# Patient Record
Sex: Male | Born: 1935 | Race: Black or African American | Hispanic: No | Marital: Married | Smoking: Never smoker
Health system: Southern US, Community
[De-identification: ages and names within clinical notes are randomized; demographics above are authoritative.]

## PROBLEM LIST (undated history)

## (undated) DIAGNOSIS — F32A Depression, unspecified: Secondary | ICD-10-CM

## (undated) DIAGNOSIS — I10 Essential (primary) hypertension: Secondary | ICD-10-CM

## (undated) DIAGNOSIS — F419 Anxiety disorder, unspecified: Secondary | ICD-10-CM

## (undated) DIAGNOSIS — K5790 Diverticulosis of intestine, part unspecified, without perforation or abscess without bleeding: Secondary | ICD-10-CM

## (undated) DIAGNOSIS — I878 Other specified disorders of veins: Secondary | ICD-10-CM

## (undated) DIAGNOSIS — F329 Major depressive disorder, single episode, unspecified: Secondary | ICD-10-CM

## (undated) DIAGNOSIS — K222 Esophageal obstruction: Secondary | ICD-10-CM

## (undated) DIAGNOSIS — C73 Malignant neoplasm of thyroid gland: Secondary | ICD-10-CM

## (undated) DIAGNOSIS — E739 Lactose intolerance, unspecified: Secondary | ICD-10-CM

## (undated) DIAGNOSIS — E559 Vitamin D deficiency, unspecified: Secondary | ICD-10-CM

## (undated) DIAGNOSIS — K219 Gastro-esophageal reflux disease without esophagitis: Secondary | ICD-10-CM

## (undated) DIAGNOSIS — E039 Hypothyroidism, unspecified: Secondary | ICD-10-CM

## (undated) DIAGNOSIS — Z87438 Personal history of other diseases of male genital organs: Secondary | ICD-10-CM

## (undated) DIAGNOSIS — K5730 Diverticulosis of large intestine without perforation or abscess without bleeding: Secondary | ICD-10-CM

## (undated) DIAGNOSIS — K449 Diaphragmatic hernia without obstruction or gangrene: Secondary | ICD-10-CM

## (undated) DIAGNOSIS — K589 Irritable bowel syndrome without diarrhea: Secondary | ICD-10-CM

## (undated) HISTORY — PX: CATARACT EXTRACTION: SUR2

## (undated) HISTORY — DX: Gastro-esophageal reflux disease without esophagitis: K21.9

## (undated) HISTORY — DX: Malignant neoplasm of thyroid gland: C73

## (undated) HISTORY — DX: Vitamin D deficiency, unspecified: E55.9

## (undated) HISTORY — PX: COLONOSCOPY: SHX174

## (undated) HISTORY — DX: Major depressive disorder, single episode, unspecified: F32.9

## (undated) HISTORY — DX: Depression, unspecified: F32.A

## (undated) HISTORY — DX: Esophageal obstruction: K22.2

## (undated) HISTORY — DX: Hypothyroidism, unspecified: E03.9

## (undated) HISTORY — DX: Other specified disorders of veins: I87.8

## (undated) HISTORY — DX: Personal history of other diseases of male genital organs: Z87.438

## (undated) HISTORY — DX: Anxiety disorder, unspecified: F41.9

## (undated) HISTORY — DX: Diaphragmatic hernia without obstruction or gangrene: K44.9

## (undated) HISTORY — DX: Lactose intolerance, unspecified: E73.9

## (undated) HISTORY — DX: Irritable bowel syndrome, unspecified: K58.9

## (undated) HISTORY — DX: Diverticulosis of intestine, part unspecified, without perforation or abscess without bleeding: K57.90

## (undated) HISTORY — DX: Essential (primary) hypertension: I10

## (undated) HISTORY — DX: Diverticulosis of large intestine without perforation or abscess without bleeding: K57.30

---

## 1988-10-09 HISTORY — PX: THYROIDECTOMY: SHX17

## 1990-10-09 HISTORY — PX: HERNIA REPAIR: SHX51

## 2000-04-18 ENCOUNTER — Ambulatory Visit (HOSPITAL_COMMUNITY): Admission: RE | Admit: 2000-04-18 | Discharge: 2000-04-18 | Payer: Self-pay | Admitting: Gastroenterology

## 2001-11-13 ENCOUNTER — Ambulatory Visit (HOSPITAL_COMMUNITY): Admission: RE | Admit: 2001-11-13 | Discharge: 2001-11-13 | Payer: Self-pay | Admitting: Gastroenterology

## 2003-06-05 ENCOUNTER — Ambulatory Visit (HOSPITAL_COMMUNITY): Admission: RE | Admit: 2003-06-05 | Discharge: 2003-06-05 | Payer: Self-pay | Admitting: Internal Medicine

## 2003-07-27 ENCOUNTER — Encounter: Admission: RE | Admit: 2003-07-27 | Discharge: 2003-07-27 | Payer: Self-pay | Admitting: Internal Medicine

## 2003-07-27 ENCOUNTER — Encounter: Payer: Self-pay | Admitting: Internal Medicine

## 2005-07-28 ENCOUNTER — Ambulatory Visit: Payer: Self-pay | Admitting: Gastroenterology

## 2006-11-01 ENCOUNTER — Ambulatory Visit: Payer: Self-pay | Admitting: Gastroenterology

## 2006-11-07 ENCOUNTER — Ambulatory Visit: Payer: Self-pay | Admitting: Gastroenterology

## 2006-11-27 ENCOUNTER — Ambulatory Visit: Payer: Self-pay | Admitting: Gastroenterology

## 2009-10-20 DIAGNOSIS — K449 Diaphragmatic hernia without obstruction or gangrene: Secondary | ICD-10-CM | POA: Insufficient documentation

## 2009-10-20 DIAGNOSIS — I10 Essential (primary) hypertension: Secondary | ICD-10-CM | POA: Insufficient documentation

## 2009-10-20 DIAGNOSIS — E890 Postprocedural hypothyroidism: Secondary | ICD-10-CM

## 2009-10-20 DIAGNOSIS — K219 Gastro-esophageal reflux disease without esophagitis: Secondary | ICD-10-CM

## 2009-10-20 DIAGNOSIS — K222 Esophageal obstruction: Secondary | ICD-10-CM | POA: Insufficient documentation

## 2009-10-22 ENCOUNTER — Ambulatory Visit: Payer: Self-pay | Admitting: Gastroenterology

## 2009-10-22 DIAGNOSIS — E559 Vitamin D deficiency, unspecified: Secondary | ICD-10-CM | POA: Insufficient documentation

## 2009-10-22 DIAGNOSIS — K5731 Diverticulosis of large intestine without perforation or abscess with bleeding: Secondary | ICD-10-CM | POA: Insufficient documentation

## 2009-10-22 DIAGNOSIS — R1013 Epigastric pain: Secondary | ICD-10-CM | POA: Insufficient documentation

## 2009-10-22 DIAGNOSIS — Z85850 Personal history of malignant neoplasm of thyroid: Secondary | ICD-10-CM | POA: Insufficient documentation

## 2009-10-22 LAB — CONVERTED CEMR LAB
Basophils Absolute: 0 10*3/uL (ref 0.0–0.1)
Basophils Relative: 0.5 % (ref 0.0–3.0)
Eosinophils Absolute: 0.1 10*3/uL (ref 0.0–0.7)
Eosinophils Relative: 2.2 % (ref 0.0–5.0)
Ferritin: 76 ng/mL (ref 22.0–322.0)
Folate: 6.8 ng/mL
HCT: 41.1 % (ref 39.0–52.0)
Hemoglobin: 13.5 g/dL (ref 13.0–17.0)
Iron: 63 ug/dL (ref 42–165)
Lymphocytes Relative: 38.7 % (ref 12.0–46.0)
Lymphs Abs: 1.4 10*3/uL (ref 0.7–4.0)
MCHC: 32.9 g/dL (ref 30.0–36.0)
MCV: 95.6 fL (ref 78.0–100.0)
Monocytes Absolute: 0.2 10*3/uL (ref 0.1–1.0)
Monocytes Relative: 6.2 % (ref 3.0–12.0)
Neutro Abs: 1.9 10*3/uL (ref 1.4–7.7)
Neutrophils Relative %: 52.4 % (ref 43.0–77.0)
Platelets: 185 10*3/uL (ref 150.0–400.0)
RBC: 4.3 M/uL (ref 4.22–5.81)
RDW: 12.3 % (ref 11.5–14.6)
Saturation Ratios: 21.5 % (ref 20.0–50.0)
Transferrin: 209.3 mg/dL — ABNORMAL LOW (ref 212.0–360.0)
Vitamin B-12: 420 pg/mL (ref 211–911)
WBC: 3.6 10*3/uL — ABNORMAL LOW (ref 4.5–10.5)

## 2009-10-27 ENCOUNTER — Ambulatory Visit (HOSPITAL_COMMUNITY)
Admission: RE | Admit: 2009-10-27 | Discharge: 2009-10-27 | Payer: Self-pay | Source: Home / Self Care | Admitting: Gastroenterology

## 2010-05-13 ENCOUNTER — Encounter: Payer: Self-pay | Admitting: Gastroenterology

## 2010-05-18 ENCOUNTER — Ambulatory Visit: Payer: Self-pay | Admitting: Gastroenterology

## 2010-06-15 ENCOUNTER — Encounter (INDEPENDENT_AMBULATORY_CARE_PROVIDER_SITE_OTHER): Payer: Self-pay | Admitting: *Deleted

## 2010-06-15 ENCOUNTER — Telehealth: Payer: Self-pay | Admitting: Gastroenterology

## 2010-06-16 ENCOUNTER — Ambulatory Visit: Payer: Self-pay | Admitting: Gastroenterology

## 2010-06-20 ENCOUNTER — Telehealth: Payer: Self-pay | Admitting: Gastroenterology

## 2010-06-29 ENCOUNTER — Ambulatory Visit: Payer: Self-pay | Admitting: Gastroenterology

## 2010-07-04 ENCOUNTER — Ambulatory Visit (HOSPITAL_COMMUNITY): Admission: RE | Admit: 2010-07-04 | Discharge: 2010-07-04 | Payer: Self-pay | Admitting: Gastroenterology

## 2010-07-18 ENCOUNTER — Telehealth: Payer: Self-pay | Admitting: Gastroenterology

## 2010-11-08 NOTE — Procedures (Signed)
Summary: EGD   EGD  Procedure date:  11/27/2006  Findings:      Location: [facility]    EGD  Procedure date:  11/27/2006  Findings:      Location: [facility]   Patient Name: Ian Weber, Ian Weber MRN: [id number] Procedure Procedures: Panendoscopy (EGD) CPT: 43235.    with esophageal dilation. CPT: G9296129.  Personnel: Endoscopist: Vania Rea. Jarold Motto, MD.  Exam Location: Exam performed in Outpatient Clinic. Outpatient  Patient Consent: Procedure, Alternatives, Risks and Benefits discussed, consent obtained, from patient. Consent was obtained by the RN.  Indications Symptoms: Dysphagia. Reflux symptoms for 6-10 yrs, occurring 3-6 times/wk.  History  Current Medications: Patient is not currently taking Coumadin.  Medical/Surgical History: Hypertension, Hypothyroidism, Reflux Disease,  Pre-Exam Physical: Performed Nov 27, 2006  Cardio-pulmonary exam, Abdominal exam, Extremity exam, Mental status exam WNL.  Comments: Pt. history reviewed/updated, physical exam performed prior to initiation of sedation?yes Exam Exam Info: Maximum depth of insertion Duodenum, intended Duodenum. Patient position: on left side. Duration of exam: 10 minutes. Vocal cords visualized. Gastric retroflexion performed. Images taken. ASA Classification: II. Tolerance: excellent.  Sedation Meds: Patient assessed and found to be appropriate for moderate (conscious) sedation. Cetacaine Spray 2 sprays given aerosolized. Versed 1 mg. given IV.  Monitoring: BP and pulse monitoring done. Oximetry used. Supplemental O2 given at 2 Liters.  Instrument(s): GIF 160. Serial [id number].   Findings - HIATAL HERNIA: Prolapsing, 4 cms. in length. ICD9: Hernia, Hiatal: 553.3. - Normal: Proximal Esophagus to Distal Esophagus. Not Seen: Tumor. Barrett's esophagus. Esophageal inflammation. Mucosal abnormality. Foreign body. Stricture. Varices.  - Dilation: Proximal Esophagus. for  dysphagia without stricture. Maloney dilator used, Diameter: 58 F, No Resistance, Minimal Heme present on extraction. 1  total dilators used. Patient tolerance excellent. Outcome: successful.  - Normal: Fundus to Duodenal 2nd Portion. Tumor. Ulcer. Mucosal abnormality. AVM's. Foreign body. Polyp. Varices.   Assessment  Diagnoses: 553.3: Hernia, Hiatal. Chronic GERD.   Comments: Occult peptic stricture dilated.... Events  Unplanned Intervention: No unplanned interventions were required.  Plans Medication(s): PPI: Esomeprazole/Nexium 40 mg QAM, starting Nov 27, 2006 for indefinitely.   Patient Education: Patient given standard instructions for: Reflux. Stenosis / Stricture.  Disposition: After procedure patient sent to recovery. After recovery patient sent home.  Scheduling: Follow-up prn.    This report was created from the original endoscopy report, which was reviewed and signed by the above listed endoscopist.

## 2010-11-08 NOTE — Letter (Signed)
Summary: EGD Instructions  [facility]  [address] [place], [place] [address]   Phone: [phone]  Fax: [phone]       [facility]    [birth year]    MRN: [id number]       Procedure Day Dorna Bloom:  Wednesday 06/29/2010     Arrival Time:  2:30 pm     Procedure Time: 3:30 pm     Location of Procedure:                    _ x _  Endoscopy Center (4th Floor)    PREPARATION FOR ENDOSCOPY   On Wednesday 9/21, THE DAY OF THE PROCEDURE:  1.   No solid foods, milk or milk products are allowed after midnight the night before your procedure.  2.   Do not drink anything colored red or purple.  Avoid juices with pulp.  No orange juice.  3.  You may drink clear liquids until 1:30 pm, which is 2 hours before your procedure.                                                                                                CLEAR LIQUIDS INCLUDE: Water Jello Ice Popsicles Tea (sugar ok, no milk/cream) Powdered fruit flavored drinks Coffee (sugar ok, no milk/cream) Gatorade Juice: apple, white grape, white cranberry  Lemonade Clear bullion, consomm, broth Carbonated beverages (any kind) Strained chicken noodle soup Hard Candy   MEDICATION INSTRUCTIONS  Unless otherwise instructed, you should take regular prescription medications with a small sip of water as early as possible the morning of your procedure.               OTHER INSTRUCTIONS  You will need a responsible adult at least 75 years of age to accompany you and drive you home.   This person must remain in the waiting room during your procedure.  Wear loose fitting clothing that is easily removed.  Leave jewelry and other valuables at home.  However, you may wish to bring a book to read or an iPod/MP3 player to listen to music as you wait for your procedure to start.  Remove all body piercing jewelry and leave at home.  Total time from sign-in until discharge is approximately 2-3 hours.  You  should go home directly after your procedure and rest.  You can resume normal activities the day after your procedure.  The day of your procedure you should not:   Drive   Make legal decisions   Operate machinery   Drink alcohol   Return to work  You will receive specific instructions about eating, activities and medications before you leave.    The above instructions have been reviewed and explained to me by   Ezra Sites RN  June 16, 2010 10:33 AM     I fully understand and can verbalize these instructions _____________________________ Date _________

## 2010-11-08 NOTE — Miscellaneous (Signed)
Summary: rx for diflucane  Clinical Lists Changes  Medications: Added new medication of FLUCONAZOLE 100 MG TABS (FLUCONAZOLE) take twice a day on day 1, then daily for 10 days - Signed Rx of FLUCONAZOLE 100 MG TABS (FLUCONAZOLE) take twice a day on day 1, then daily for 10 days;  #12 x 0;  Signed;  Entered by: Oda Cogan RN;  Authorized by: Mardella Layman MD [facility];  Method used: [facility]  [phone]*, [address], [place], [place], [place]  [id number], Ph: [phone] or [phone], Fax: [phone]    Prescriptions: FLUCONAZOLE 100 MG TABS (FLUCONAZOLE) take twice a day on day 1, then daily for 10 days  #12 x 0   Entered by:   Oda Cogan RN   Authorized by:   Mardella Layman MD [facility]   Signed by:   Oda Cogan RN on 06/29/2010   Method used:   [facility]  [address] [phone]* (retail)       [address]       [place], [place]  [id number]       Ph: [phone] or [phone]       Fax: [phone]   RxID:   [id number]

## 2010-11-08 NOTE — Miscellaneous (Signed)
Summary: [facility]  Clinical Lists Changes  Allergies: Changed allergy or adverse reaction from CODEINE PHOSPHATE (CODEINE PHOSPHATE) to CODEINE PHOSPHATE (CODEINE PHOSPHATE) Changed allergy or adverse reaction from SULFAMETHOXAZOLE (SULFAMETHOXAZOLE) to SULFAMETHOXAZOLE (SULFAMETHOXAZOLE) Changed allergy or adverse reaction from PREDNISONE to PREDNISONE

## 2010-11-08 NOTE — Procedures (Signed)
Summary: Upper Endoscopy  Patient: Ian Weber Note: All result statuses are Final unless otherwise noted.  Tests: (1) Upper Endoscopy (EGD)   EGD Upper Endoscopy       DONE     [facility]     [address]     [place], [place]  [address]           ENDOSCOPY PROCEDURE REPORT           PATIENT:  Ian Weber, Ian Weber  MR#:  [id number]     BIRTHDATE:  October 14, 1933, 75 yrs. old  GENDER:  male           ENDOSCOPIST:  Vania Rea. Jarold Motto, MD, [facility]     Referred by:           PROCEDURE DATE:  06/29/2010     PROCEDURE:  EGD, diagnostic     ASA CLASS:  Class II     INDICATIONS:  abdominal pain despite treatment           MEDICATIONS:   Fentanyl 50 mcg IV, Versed 5 mg IV     TOPICAL ANESTHETIC:           DESCRIPTION OF PROCEDURE:   After the risks benefits and     alternatives of the procedure were thoroughly explained, informed     consent was obtained.  The LB GIF-H180 [id number] endoscope was     introduced through the mouth and advanced to the second portion of     the duodenum, without limitations.  The instrument was slowly     withdrawn as the mucosa was fully examined.     <<PROCEDUREIMAGES>>           Candida esophagitis in the mid esophagus. SEE PICTURES.  Otherwise     the examination was normal.    Retroflexed views revealed no     abnormalities.    The scope was then withdrawn from the patient     and the procedure completed.           COMPLICATIONS:  None           ENDOSCOPIC IMPRESSION:     1) Candida esophagitis in the mid esophagus     2) Otherwise normal examination     R/O GALLSTONES,,,,     RECOMMENDATIONS:     1.DIFLUCAN 100 MG BID FOR 1 DAY,THEN DAILY FOR 10 DAYS.#15.NO     REFILL.     2. WE WILL CALL TO SCHEDULE ULTRASOUND EXAM.     3.STOP NEXIUM.           REPEAT EXAM:  No           ______________________________     Vania Rea. Jarold Motto, MD, Clementeen Graham           CC:  Ralene Ok MD           n.     Rosalie DoctorVania Rea. Farida Mcreynolds at 06/29/2010 04:05 PM        Kita, Jesusita Oka, [phone]  Note: An exclamation mark (!) indicates a result that was not dispersed into the flowsheet. Document Creation Date: 06/29/2010 4:06 PM _______________________________________________________________________  (1) Order result status: Final Collection or observation date-time: 06/29/2010 15:57 Requested date-time:  Receipt date-time:  Reported date-time:  Referring Physician:   Ordering Physician: Sheryn Bison [phone]) Specimen Source:  Source: Launa Grill Order Number: [id number] Lab site:   Appended Document: Orders Update information sent to lec and gave to Peggy she will give  ot the patient.   Clinical Lists Changes  Orders: Added new Test order of Ultrasound Abdomen (UAS) - Signed

## 2010-11-08 NOTE — Assessment & Plan Note (Signed)
Summary: stomach pain after eating...as.   History of Present Illness Visit Type: Follow-up Visit Primary GI MD: Sheryn Bison MD FACP FAGA Primary Provider: Felisa Bonier, MD Chief Complaint: Adominal pain after eating in epigastric region  is better now History of Present Illness:   This patient is a 77 year old African American male with chronic GERD he's had intermittent epigastric abdominal pain described as a dull aching pain occurring every several months and lasting several weeks in duration made worse by eating. He denies alcohol, cigarette, or NSAID abuse. He's had no dysphagia, anorexia, weight loss, or nausea and vomiting or change in bowel habits, melena or hematochezia. Recent liver profile per his primary care doctor was normal. Last ultrasound exam was done in January 2008. He has known diverticulosis on recent colonoscopy. Previous evaluation for Helicobacter pylori gastritis was negative in 2003 .He has chronic low grade leukopenia and borderline B12 levels.   GI Review of Systems    Reports abdominal pain.     Location of  Abdominal pain: upper abdomen.    Denies acid reflux, belching, bloating, chest pain, dysphagia with liquids, dysphagia with solids, heartburn, loss of appetite, nausea, vomiting, vomiting blood, weight loss, and  weight gain.        Denies anal fissure, black tarry stools, change in bowel habit, constipation, diarrhea, diverticulosis, fecal incontinence, heme positive stool, hemorrhoids, irritable bowel syndrome, jaundice, light color stool, liver problems, rectal bleeding, and  rectal pain. Preventive Screening-Counseling & Management  Alcohol-Tobacco     Smoking Status: never      Drug Use:  no.      Current Medications (verified): 1)  Omeprazole 20 Mg Cpdr (Omeprazole) .... Take 1 Capsule By Mouth Once A Day 2)  Synthroid 112 Mcg Tabs (Levothyroxine Sodium) .Marland Kitchen.. 1 Tablet By Mouth Once Daily 3)  Bystolic 2.5 Mg Tabs (Nebivolol Hcl) .Marland Kitchen.. 1 Tablet  By Mouth Once Daily 4)  Avapro 300 Mg Tabs (Irbesartan) .Marland Kitchen.. 1 Tablet By Mouth Once Daily 5)  Vitamin D (Ergocalciferol) 50000 Unit Caps (Ergocalciferol) .Marland Kitchen.. 1 Tablet By Mouth Once Weekly 6)  Metamucil 30.9 % Powd (Psyllium) .Marland Kitchen.. 1 Teaspoon in 8 Oz Glass of Water Once Daily  Allergies: 1)  ! Prednisone 2)  Codeine Phosphate (Codeine Phosphate) 3)  Sulfamethoxazole (Sulfamethoxazole)  Past History:  Past medical, surgical, family and social histories (including risk factors) reviewed for relevance to current acute and chronic problems.  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 Thyroidectomy (cancer) Hernia Surgery  Family History: Reviewed history and no changes required. No FH of Colon Cancer: Unknown Family History  Social History: Reviewed history and no changes required. Occupation: Retired Patient has never smoked.  Alcohol Use - no Illicit Drug Use - no Married 10 children Smoking Status:  never Drug Use:  no  Review of Systems       The patient complains of urination - excessive.  The patient denies allergy/sinus, anemia, anxiety-new, arthritis/joint pain, back pain, blood in urine, breast changes/lumps, change in vision, confusion, cough, coughing up blood, depression-new, fainting, fatigue, fever, headaches-new, hearing problems, heart murmur, heart rhythm changes, itching, muscle pains/cramps, night sweats, nosebleeds, shortness of breath, skin rash, sleeping problems, sore throat, swelling of feet/legs, swollen lymph glands, thirst - excessive, urination changes/pain, urine leakage, vision changes, and voice change.    Vital Signs:  Patient profile:   77 year  old male Height:      72 inches Weight:      222.50 pounds BMI:      30.29 Pulse rate:   60 / minute Pulse rhythm:   regular BP sitting:   132 / 80  (left arm)  Vitals Entered By: Milford Cage NCMA (October 22, 2009 8:34 AM)  Physical Exam  General:  Well developed, well nourished, no acute distress.healthy appearing.  healthy appearing.   Head:  Normocephalic and atraumatic. Eyes:  PERRLA, no icterus.exam deferred to patient's ophthalmologist.  exam deferred to patient's ophthalmologist.   Neck:  Supple; no masses or thyromegaly. Lungs:  Clear throughout to auscultation. Heart:  Regular rate and rhythm; no murmurs, rubs,  or bruits. Abdomen:  Soft, nontender and nondistended. No masses, hepatosplenomegaly or hernias noted. Normal bowel sounds. Extremities:  No clubbing, cyanosis, edema or deformities noted.trace pedal edema.  trace pedal edema.   Neurologic:  Alert and  oriented x4;  grossly normal neurologically. Cervical Nodes:  No significant cervical adenopathy. Inguinal Nodes:  No significant inguinal adenopathy. Psych:  Alert and cooperative. Normal mood and affect.   Impression & Recommendations:  Problem # 1:  ABDOMINAL PAIN, EPIGASTRIC (ICD-789.06) Assessment Unchanged Rule Out cholelithiasis versus functional dyspepsia. I ordered upper normal ultrasound exam and repeat labs. Continue daily omeprazole and standard antireflux maneuvers. Orders: TLB-CBC Platelet - w/Differential (85025-CBCD) TLB-B12, Serum-Total ONLY (09811-B14) TLB-Ferritin (82728-FER) TLB-Folic Acid (Folate) (82746-FOL) TLB-IBC Pnl (Iron/FE;Transferrin) (83550-IBC) Ultrasound Abdomen (UAS)  Problem # 2:  ESOPHAGEAL STRICTURE (ICD-530.3) Assessment: Improved  Problem # 3:  DIVERTICULOSIS-COLON (ICD-562.10) Assessment: Improved high-fiber dietas tolerated and daily metamucil.  Problem # 4:  VITAMIN D DEFICIENCY (ICD-268.9) Assessment: Unchanged continue other meds per primary care  Problem # 5:  HYPERTENSION (ICD-401.9) Assessment: Improved Blood pressure  today is100/30, and he is to continue his Bystolic 2.5 mg a day  Patient Instructions: 1)  Copy sent to : Dr. Felisa Bonier 2)  Please continue current medications.  3)  Labs Pending 4)  Upper abdominal ultrasound exam 5)  Repeat B12 level. 6)  Diet should be high in fiber ( fruits, vegetables, whole grains) but low in residue. Drink at least eight (8) glasses of water a day.  7)  The medication list was reviewed and reconciled.  All changed / newly prescribed medications were explained.  A complete medication list was provided to the patient / caregiver.

## 2010-11-08 NOTE — Progress Notes (Signed)
Summary: Ultrasound results  Phone Note Call from Patient Call back at Home Phone [phone]   Caller: Patient Call For: Dr. Jarold Motto Reason for Call: Lab or Test Results Summary of Call: Calling about Ultrasound results Initial call taken by: Karna Christmas,  July 18, 2010 12:29 PM  Follow-up for Phone Call        patient advised the [place] was normal Follow-up by: Darcey Nora RN, CGRN,  July 18, 2010 2:47 PM

## 2010-11-08 NOTE — Procedures (Signed)
Summary: Colon   Colonoscopy  Procedure date:  11/27/2006  Findings:      Location:  [facility].    Colonoscopy  Procedure date:  11/27/2006  Findings:      Location:  [facility].   Patient Name: Ian Weber, Ian Weber MRN: [id number] Procedure Procedures: Colonoscopy CPT: 8312614060.  Personnel: Endoscopist: Vania Rea. Jarold Motto, MD.  Exam Location: Exam performed in Outpatient Clinic. Outpatient  Patient Consent: Procedure, Alternatives, Risks and Benefits discussed, consent obtained, from patient. Consent was obtained by the RN.  Indications Symptoms: Abdominal pain / bloating. Change in bowel habits.  History  Current Medications: Patient is not currently taking Coumadin.  Medical/ Surgical History: Hypertension, Hypothyroidism, Reflux Disease,  Pre-Exam Physical: Performed Nov 27, 2006. Cardio-pulmonary exam, Rectal exam, Abdominal exam, Extremity exam, Mental status exam WNL.  Comments: Pt. history reviewed/updated, physical exam performed prior to initiation of sedation?yes Exam Exam: Extent of exam reached: Cecum, extent intended: Cecum.  The cecum was identified by appendiceal orifice and IC valve. Patient position: on left side. Time to Cecum: 00:02:31. Time for Withdrawl: 00:05. Colon retroflexion performed. Images taken. ASA Classification: II. Tolerance: excellent.  Monitoring: Pulse and BP monitoring, Oximetry used. Supplemental O2 given. at 2 Liters.  Colon Prep Used Golytely for colon prep. Prep results: good.  Sedation Meds: Patient assessed and found to be appropriate for moderate (conscious) sedation. Fentanyl 75 mcg. given IV. Versed 6 mg. given IV.  Instrument(s): CF 140L. Serial [id number].  Findings - DIVERTICULOSIS: Descending Colon to Sigmoid Colon. Not bleeding. ICD9: Diverticulosis, Colon: 562.10.  - NORMAL EXAM: Cecum to Rectum. Not Seen: Polyps. AVM's. Colitis. Tumors. Melanosis. Crohn's. Hemorrhoids.    Assessment  Diagnoses: 562.10: Diverticulosis, Colon.   Events  Unplanned Interventions: No intervention was required.  Plans Medication Plan: Referring provider to order medications. Fiber supplements: Methylcellulose 1 tsp QAM, starting Nov 27, 2006 for indefinitely.   Patient Education: Patient given standard instructions for: Diverticulosis. high fiber diet.  Disposition: After procedure patient sent to recovery. After recovery patient sent home.  Scheduling/Referral: Follow-Up prn.    This report was created from the original endoscopy report, which was reviewed and signed by the above listed endoscopist.

## 2010-11-08 NOTE — Progress Notes (Signed)
Summary: med reaction  Phone Note Call from Patient Call back at Home Phone [phone]   Caller: Patient Call For: Dr. Jarold Motto Reason for Call: Talk to Nurse Summary of Call: thinks Nexium is causing severe dizziness Initial call taken by: Vallarie Mare,  June 20, 2010 10:23 AM  Follow-up for Phone Call        Pt. started Nexium on 06/16/10 and the next day he started having dizziness.He took med. thru 06/18/10 and did not take it yesterday says he plans to  to re-start prilosec OTC which he has on handand will keep EGD. appt. Follow-up by: Teryl Lucy RN,  June 20, 2010 11:08 AM

## 2010-11-08 NOTE — Assessment & Plan Note (Signed)
Summary: ABD PAIN...AS.   History of Present Illness Visit Type: Follow-up Visit Primary GI MD: Sheryn Bison MD FACP FAGA Primary Provider: Felisa Bonier, MD Chief Complaint: epigastric pain, burning History of Present Illness:   continued reflux symptoms with varus external and subxiphoid pain despite Prilosec 20 mg a day. He denies dysphasia, anorexia, weight loss, or hepatobiliary complaints. Ultrasound exam and labs have all been unremarkable.   GI Review of Systems    Reports abdominal pain, acid reflux, and  bloating.     Location of  Abdominal pain: epigastric area.    Denies belching, chest pain, dysphagia with liquids, dysphagia with solids, heartburn, loss of appetite, nausea, vomiting, vomiting blood, weight loss, and  weight gain.        Denies anal fissure, black tarry stools, change in bowel habit, constipation, diarrhea, diverticulosis, fecal incontinence, heme positive stool, hemorrhoids, irritable bowel syndrome, jaundice, light color stool, liver problems, rectal bleeding, and  rectal pain.    Current Medications (verified): 1)  Omeprazole 20 Mg Cpdr (Omeprazole) .... Take 1 Capsule By Mouth Once A Day 2)  Synthroid 112 Mcg Tabs (Levothyroxine Sodium) .Marland Kitchen.. 1 Tablet By Mouth Once Daily 3)  Bystolic 2.5 Mg Tabs (Nebivolol Hcl) .Marland Kitchen.. 1 Tablet By Mouth Once Daily 4)  Avapro 300 Mg Tabs (Irbesartan) .Marland Kitchen.. 1 Tablet By Mouth Once Daily 5)  Vitamin D (Ergocalciferol) 50000 Unit Caps (Ergocalciferol) .Marland Kitchen.. 1 Tablet By Mouth Once Weekly 6)  Metamucil 30.9 % Powd (Psyllium) .Marland Kitchen.. 1 Teaspoon in 8 Oz Glass of Water Once Daily  Allergies (verified): 1)  ! Prednisone 2)  Codeine Phosphate (Codeine Phosphate) 3)  Sulfamethoxazole (Sulfamethoxazole)  Past History:  Past medical, surgical, family and social histories (including risk factors) reviewed for relevance to current acute and chronic problems.  Past Medical History: Reviewed history from 10/22/2009 and no changes  required. Current Problems:  HYPOTHYROIDISM, POSTSURGICAL (ICD-244.0) HYPERTENSION (ICD-401.9) ESOPHAGEAL STRICTURE (ICD-530.3) GERD (ICD-530.81) HIATAL HERNIA (ICD-553.3)  Past Surgical History: Reviewed history from 10/20/2009 and no changes required. Thyroidectomy (cancer) Hernia Surgery  Family History: Reviewed history from 10/22/2009 and no changes required. No FH of Colon Cancer: Unknown Family History  Social History: Reviewed history from 10/22/2009 and no changes required. Occupation: Retired Patient has never smoked.  Alcohol Use - no Illicit Drug Use - no Married 10 children   Review of Systems  The patient denies allergy/sinus, anemia, anxiety-new, arthritis/joint pain, back pain, blood in urine, breast changes/lumps, change in vision, confusion, cough, coughing up blood, depression-new, fainting, fatigue, fever, headaches-new, hearing problems, heart murmur, heart rhythm changes, itching, menstrual pain, muscle pains/cramps, night sweats, nosebleeds, pregnancy symptoms, shortness of breath, skin rash, sleeping problems, sore throat, swelling of feet/legs, swollen lymph glands, thirst - excessive, urination - excessive, urination changes/pain, urine leakage, vision changes, and voice change.    Vital Signs:  Patient profile:   77 year old male Height:      72 inches Weight:      203 pounds BMI:     27.63 Pulse rate:   60 / minute Pulse rhythm:   regular BP sitting:   126 / 80  (left arm) Cuff size:   regular  Vitals Entered By: June McMurray CMA Duncan Dull) (May 18, 2010 9:47 AM)  Physical Exam  General:  Well developed, well nourished, no acute distress.healthy appearing.   Head:  Normocephalic and atraumatic. Eyes:  PERRLA, no icterus.exam deferred to patient's ophthalmologist.   Lungs:  Clear throughout to auscultation. Heart:  Regular rate and rhythm;  no murmurs, rubs,  or bruits. Abdomen:  Soft, nontender and nondistended. No masses,  hepatosplenomegaly or hernias noted. Normal bowel sounds. Cervical Nodes:  No significant cervical adenopathy. Psych:  Alert and cooperative. Normal mood and affect.   Impression & Recommendations:  Problem # 1:  ABDOMINAL PAIN, EPIGASTRIC (ICD-789.06) Assessment Unchanged Symptoms and chronic workup consistent with acid reflux disease. I will change him to Dexilant 60 mg a day with strict anti-reflex maneuvers. He is to call in 2 weeks time for progress report. If not better, we will proceed with repeat endoscopy was last done in 2008.  Problem # 2:  DIVERTICULOSIS-COLON (ICD-562.10) Assessment: Unchanged Continue high-fiber diet as tolerated.  Patient Instructions: 1)  Dexilant samples given.  Try for 2 weeks and call our office with an update.  If the Dexilant works better we will call in a prescription for you. 2)  The medication list was reviewed and reconciled.  All changed / newly prescribed medications were explained.  A complete medication list was provided to the patient / caregiver. 3)  Copy sent to : Dr. Felisa Bonier 4)  Avoid foods high in acid content ( tomatoes, citrus juices, spicy foods) . Avoid eating within 3 to 4 hours of lying down or before exercising. Do not over eat; try smaller more frequent meals. Elevate head of bed four inches when sleeping.

## 2010-11-08 NOTE — Progress Notes (Signed)
Summary: Symptoms update  Phone Note Call from Patient Call back at Home Phone [phone]   Caller: Patient Call For: Dr. Jarold Motto Reason for Call: Talk to Nurse Summary of Call: has taken all Dexilant samples, but this has not helped his reflux per pt Initial call taken by: Vallarie Mare,  June 15, 2010 8:24 AM  Follow-up for Phone Call        scheduled patient for an egd on 06/29/2010, he will come for a previsit on 06/16/2010. I will put samples of Nexium out front for him to pick up to last until his procedure.  Follow-up by: Harlow Mares CMA Duncan Dull),  June 15, 2010 9:33 AM    New/Updated Medications: NEXIUM 40 MG CPDR (ESOMEPRAZOLE MAGNESIUM) take one by mouth once daily

## 2011-02-24 NOTE — Assessment & Plan Note (Signed)
[facility]                         GASTROENTEROLOGY OFFICE NOTE   NAME:Ian Weber, Ian Weber                        MRN:          [id number]  DATE:11/01/2006                            DOB:          08/17/1936    Mr. Ian Weber is a 77 year old black male who continues to complain of  recurrent reflux symptoms, requiring elevation of his Nexium from once  to twice a day. He is also having smaller stools with some constipation  but denies rectal bleeding. His last colonoscopy exam was in July of  2001 and last endoscopy in February of 2003. At that time he had an  esophageal stricture that was dilated.   Mr. Ian Weber complains of some dysphagia and slow swallowing. He denies  any specific hepatobiliary complaints but does intermittently have right  upper quadrant pain. Apparently recent did Hemoccult cards that were  guaiac negative.   He currently is taking;  1. Synthroid 0.125 mg a day.  2. Nexium 40 mg 1 to 2 times a day.  3. Lotensin 30 mg a day.   PRIMARY CARE PHYSICIAN:  Dr. Scotty Court.   He is a healthy-appearing black male appearing his stated age, in no  distress. Appears his stated age. He weighs 222 pounds, blood pressure  130/90. Pulse was 56 and regular.  CHEST: Clear and I could not appreciate significant murmurs, gallops, or  rubs.  ABDOMEN EXAM: Entirely normal without organomegaly, masses, or  tenderness. Bowel sounds were normal. I could not appreciate stigmata of  chronic liver disease.   ASSESSMENT:  1. Worsening acid reflux and probable peptic stricture of the      esophagus.  2. Symptomatic diverticulosis.  3. History of essential hypertension.   RECOMMENDATIONS:  1. Increase Nexium to 40 mg twice a day with strict antireflux      maneuvers.  2. High fiber diet as tolerated.  3. Follow up endoscopy and colonoscopy.  4. Follow up ultrasound exam. This has not been done in greater than 5      years.  5. Continue other  medications as per Dr. Scotty Court.     Vania Rea. Jarold Motto, MD, Caleen Essex, FAGA  Electronically Signed    DRP/MedQ  DD: 11/01/2006  DT: 11/01/2006  Job #: [id number]

## 2011-02-24 NOTE — Procedures (Signed)
[facility]  Patient:    Ian Weber, Ian Weber                        MRN: [id number] Proc. Date: 04/18/00 Adm. Date:  [id number] Attending:  Mardella Weber CC:         Ian Weber, M.D. [facility]                           Procedure Report  PROCEDURE PERFORMED:  Colonoscopy.  ENDOSCOPIST:  Ian Weber, M.D. [facility]  INDICATIONS FOR PROCEDURE:  [name] is a 77 year old black male that I have seen before with recurrent right upper quadrant pain of unexplained etiology.  A recent abdominal ultrasound exam has been unremarkable as was endoscopy.  He continued to complain of periodic right upper quadrant pain. It was felt that colonoscopy was indicated for diagnostic purposes.  The risks and benefits of the procedure were explained in detail, and he agreed to proceed as planned.  Preoperative cardiopulmonary and mental status exams were unremarkable.  DESCRIPTION OF PROCEDURE:  Throughout this procedure, the patient was on pulse oximetry and cardiac monitoring.  The patient tolerated the procedure well receiving supplemental low-flow oxygen by nasal cannula throughout the procedure.  He was anesthetized with 50 mcg of IV fentanyl and 5 mg of IV Versed.  Inspection of his rectum was unremarkable as was rectal exam.  His rectum was intubated using the Olympus adult video colonoscope.  This advanced easily and rapidly throughout the colon into the cecum.  The patients cecum including the ileocecal valve was unremarkable.  The colonoscope was then slowly withdrawn throughout the length of the colon which otherwise was free of any significant mucosal or polypoid lesions.  There were scattered diverticula throughout the left colon.  Retroflex view of the rectum was unremarkable.  Air was withdrawn as best as possible from the lower GI tract, he was returned in stable condition to the recovery room for observation.  ASSESSMENT: 1. Mild sigmoid colon  diverticulosis. 2. No cause seen for right upper quadrant pain which I think may be secondary    to irritable bowel syndrome. 3. No evidence of adenomatous colon polyps.  RECOMMENDATIONS: 1. High fiber diet and daily Citrucil as tolerated. 2. P.r.n. office follow-up as needed. DD:  04/18/00 TD:  04/18/00 Job: [id number]

## 2012-01-01 ENCOUNTER — Encounter: Payer: Self-pay | Admitting: *Deleted

## 2012-01-02 ENCOUNTER — Encounter: Payer: Self-pay | Admitting: Gastroenterology

## 2012-01-02 ENCOUNTER — Ambulatory Visit (INDEPENDENT_AMBULATORY_CARE_PROVIDER_SITE_OTHER): Payer: Medicare Other | Admitting: Gastroenterology

## 2012-01-02 DIAGNOSIS — K5900 Constipation, unspecified: Secondary | ICD-10-CM | POA: Insufficient documentation

## 2012-01-02 DIAGNOSIS — K625 Hemorrhage of anus and rectum: Secondary | ICD-10-CM

## 2012-01-02 MED ORDER — ALIGN 4 MG PO CAPS: 1.0000 | ORAL_CAPSULE | Freq: Every day | ORAL | Status: DC

## 2012-01-02 MED ORDER — POLYETHYLENE GLYCOL 3350 17 GM/SCOOP PO POWD: 17.0000 g | Freq: Every day | ORAL | Status: AC

## 2012-01-02 MED ORDER — MOVIPREP 100 G PO SOLR: 1.0000 | Freq: Once | ORAL | Status: DC

## 2012-01-02 NOTE — Patient Instructions (Signed)
Your procedure has been scheduled for 01/08/2012, please follow the seperate instructions.  Take Align once a day, samples given  Start Miralax one capful in 8 ounces of water and drink at bedtime every night. Your prescription(s) have been sent to you pharmacy, Miralax and Movi.

## 2012-01-02 NOTE — Progress Notes (Signed)
This is a 78 year old African American male with a long history of diverticulosis coli. Over the last 3 months he's had worsening constipation despite fiber additives, liberal by mouth fluids, and a trial of Linzess 290 mg a day per his primary care physician. He denies melena, has had occasional bright red blood per rectum, and does have a vague left lower quadrant discomfort. Previous colonoscopies have shown evidence of diverticulosis, and he has chronic acid reflux is not on PPI therapy at this time. He denies dysphagia, any hepatobiliary or general medical problems otherwise. He has had previous thyroid cancer with surgery, and is on daily Synthroid. He also has well-controlled hypertension on multiple medications.  Current Medications, Allergies, Past Medical History, Past Surgical History, Family History and Social History were reviewed in [employer] record.  Has seen some occasional bright red blood per rectum Negative   Physical Exam: Pressure 140/80 and pulse 60 and regular. BMI 26.75. I cannot appreciate stigmata of chronic liver disease. His abdominal exam shows no distention, organomegaly, masses or tenderness. Bowel sounds are normal. Rectal exam shows no masses or tenderness with solid stool present which is guaiac negative. I cannot appreciate prostatic enlargement.  Allergies  Allergen Reactions  . Codeine Phosphate     REACTION: weakness, sweating  . Prednisone     REACTION: unspecified  . Sulfamethoxazole     REACTION: itching   No outpatient prescriptions prior to visit.   Past Medical History  Diagnosis Date  . Hypothyroidism   . Unspecified essential hypertension   . Stricture and stenosis of esophagus   . Esophageal reflux   . Hiatal hernia   . Thyroid cancer   . Diverticulosis of colon (without mention of hemorrhage)   . Irritable bowel syndrome   . Hypertension   . Depression   . Lactose intolerance    Past Surgical History    Procedure Date  . Thyroidectomy 1990    Dr Orson Slick  . Hernia repair 1992   History   Social History  . Marital Status: Married    Spouse Name: N/A    Number of Children: N/A  . Years of Education: N/A   Occupational History  . retired    Social History Main Topics  . Smoking status: Never Smoker   . Smokeless tobacco: Never Used  . Alcohol Use: No  . Drug Use: No  . Sexually Active: None   Other Topics Concern  . None   Social History Narrative  . None   No family history on file.     Assessment and Plan: New onset constipation and a 78 year old patient refractory to standard constipation therapy. I placed him on MiraLax 8 ounces at bedtime, and we will schedule followup colonoscopy at his convenience. Ultrasound in September 2011 was unremarkable. Also hasn't continue his Metamucil with liberal by mouth fluid. He is continue other medications as listed and reviewed per primary care. Encounter Diagnosis  Name Primary?  . Constipation Yes

## 2012-01-08 ENCOUNTER — Encounter: Payer: Self-pay | Admitting: Gastroenterology

## 2012-01-08 ENCOUNTER — Ambulatory Visit (AMBULATORY_SURGERY_CENTER): Payer: Medicare Other | Admitting: Gastroenterology

## 2012-01-08 VITALS — BP 156/80 | HR 52 | Temp 97.9°F | Resp 23 | Ht 72.0 in | Wt 197.0 lb

## 2012-01-08 DIAGNOSIS — K5730 Diverticulosis of large intestine without perforation or abscess without bleeding: Secondary | ICD-10-CM

## 2012-01-08 DIAGNOSIS — R1013 Epigastric pain: Secondary | ICD-10-CM

## 2012-01-08 DIAGNOSIS — K625 Hemorrhage of anus and rectum: Secondary | ICD-10-CM

## 2012-01-08 DIAGNOSIS — Z1211 Encounter for screening for malignant neoplasm of colon: Secondary | ICD-10-CM | POA: Insufficient documentation

## 2012-01-08 DIAGNOSIS — K5900 Constipation, unspecified: Secondary | ICD-10-CM | POA: Insufficient documentation

## 2012-01-08 MED ORDER — SODIUM CHLORIDE 0.9 % IV SOLN: 500.0000 mL | INTRAVENOUS | Status: DC

## 2012-01-08 NOTE — Progress Notes (Signed)
Patient did not experience any of the following events: a burn prior to discharge; a fall within the facility; wrong site/side/patient/procedure/implant event; or a hospital transfer or hospital admission upon discharge from the facility. (G8907) Patient did not have preoperative order for IV antibiotic SSI prophylaxis. (G8918)  

## 2012-01-08 NOTE — Patient Instructions (Signed)
YOU HAD AN ENDOSCOPIC PROCEDURE TODAY AT THE [facility]: Refer to the procedure report that was given to you for any specific questions about what was found during the examination.  If the procedure report does not answer your questions, please call your gastroenterologist to clarify.  If you requested that your care partner not be given the details of your procedure findings, then the procedure report has been included in a sealed envelope for you to review at your convenience later.  YOU SHOULD EXPECT: Some feelings of bloating in the abdomen. Passage of more gas than usual.  Walking can help get rid of the air that was put into your GI tract during the procedure and reduce the bloating. If you had a lower endoscopy (such as a colonoscopy or flexible sigmoidoscopy) you may notice spotting of blood in your stool or on the toilet paper. If you underwent a bowel prep for your procedure, then you may not have a normal bowel movement for a few days.  DIET: Your first meal following the procedure should be a light meal and then it is ok to progress to your normal diet.  A half-sandwich or bowl of soup is an example of a good first meal.  Heavy or fried foods are harder to digest and may make you feel nauseous or bloated.  Likewise meals heavy in dairy and vegetables can cause extra gas to form and this can also increase the bloating.  Drink plenty of fluids but you should avoid alcoholic beverages for 24 hours.  ACTIVITY: Your care partner should take you home directly after the procedure.  You should plan to take it easy, moving slowly for the rest of the day.  You can resume normal activity the day after the procedure however you should NOT DRIVE or use heavy machinery for 24 hours (because of the sedation medicines used during the test).    SYMPTOMS TO REPORT IMMEDIATELY: A gastroenterologist can be reached at any hour.  During normal business hours, 8:30 AM to 5:00 PM Monday through Friday,  call [phone].  After hours and on weekends, please call the GI answering service at [phone] who will take a message and have the physician on call contact you.   Following lower endoscopy (colonoscopy or flexible sigmoidoscopy):  Excessive amounts of blood in the stool  Significant tenderness or worsening of abdominal pains  Swelling of the abdomen that is new, acute  Fever of 100F or higher    FOLLOW UP: If any biopsies were taken you will be contacted by phone or by letter within the next 1-3 weeks.  Call your gastroenterologist if you have not heard about the biopsies in 3 weeks.  Our staff will call the home number listed on your records the next business day following your procedure to check on you and address any questions or concerns that you may have at that time regarding the information given to you following your procedure. This is a courtesy call and so if there is no answer at the home number and we have not heard from you through the emergency physician on call, we will assume that you have returned to your regular daily activities without incident.  SIGNATURES/CONFIDENTIALITY: You and/or your care partner have signed paperwork which will be entered into your electronic medical record.  These signatures attest to the fact that that the information above on your After Visit Summary has been reviewed and is understood.  Full responsibility of the confidentiality   of this discharge information lies with you and/or your care-partner.     INFORMATION ON DIVERTICULOSIS & HIGH FIBER DIET GIVEN TO YOU TODAY.    METAMUCIL AS DIRECTED (OVER THE COUNTER) OR BENEFIBER

## 2012-01-08 NOTE — Op Note (Signed)
[facility] [address] [place], [place]  [address]  COLONOSCOPY PROCEDURE REPORT  PATIENT:  Ian Weber, Ian Weber  MR#:  [id number] BIRTHDATE:  1934-08-21, 77 yrs. old  GENDER:  male ENDOSCOPIST:  Vania Rea. Jarold Motto, MD, [facility] REF. BY:  Ralene Ok, M.D. PROCEDURE DATE:  01/08/2012 PROCEDURE:  Diagnostic Colonoscopy ASA CLASS:  Class III INDICATIONS:  Colorectal cancer screening, average risk, constipation MEDICATIONS:   propofol (Diprivan) 150 mg IV  DESCRIPTION OF PROCEDURE:   After the risks and benefits and of the procedure were explained, informed consent was obtained. Digital rectal exam was performed and revealed no abnormalities. The LB [id number] endoscope was introduced through the anus and advanced to the cecum, which was identified by both the appendix and ileocecal valve.  The quality of the prep was excellent, using MoviPrep.  The instrument was then slowly withdrawn as the colon was fully examined. <<PROCEDUREIMAGES>>  FINDINGS:  Severe diverticulosis was found in the sigmoid to descending colon segments.  No polyps or cancers were seen.  This was otherwise a normal examination of the colon.   Retroflexed views in the rectum revealed no abnormalities.    The scope was then withdrawn from the patient and the procedure completed.  COMPLICATIONS:  None ENDOSCOPIC IMPRESSION: 1) Severe diverticulosis in the sigmoid to descending colon segments 2) No polyps or cancers 3) Otherwise normal examination RECOMMENDATIONS: 1) Continue current medications 2) High fiber diet with liberal fluid intake. 3) Titrate to need 4) metamucil or benefiber  REPEAT EXAM:  No  ______________________________ Vania Rea. Jarold Motto, MD, Clementeen Graham  CC:  n. eSIGNED:   Vania Rea. Sherrey North at 01/08/2012 03:27 PM  Nicolaou, Jesusita Oka, [phone]

## 2012-01-09 ENCOUNTER — Telehealth: Payer: Self-pay

## 2012-01-09 NOTE — Telephone Encounter (Signed)
  Follow up Call-  Call back number 01/08/2012  Post procedure Call Back phone  # [phone]  Permission to leave phone message Yes     Patient questions:  Do you have a fever, pain , or abdominal swelling? no Pain Score  0 *  Have you tolerated food without any problems? yes  Have you been able to return to your normal activities? yes  Do you have any questions about your discharge instructions: Diet   no Medications  no Follow up visit  no  Do you have questions or concerns about your Care? no  Actions: * If pain score is 4 or above: No action needed, pain <4.   He pt was not at home per his wife, Tresa Endo.  She said he did not have any problems.  Maw

## 2012-01-25 ENCOUNTER — Other Ambulatory Visit: Payer: Self-pay | Admitting: Gastroenterology

## 2012-01-25 NOTE — Telephone Encounter (Signed)
Pt advised that he can buy Align OTC if he feels like it makes a difference. And he is to continue his Miralax and Metamucil. Every day

## 2012-02-15 ENCOUNTER — Encounter: Payer: Self-pay | Admitting: Gastroenterology

## 2012-02-15 ENCOUNTER — Ambulatory Visit (INDEPENDENT_AMBULATORY_CARE_PROVIDER_SITE_OTHER): Payer: Medicare Other | Admitting: Gastroenterology

## 2012-02-15 VITALS — BP 134/80 | HR 60 | Ht 72.0 in | Wt 193.0 lb

## 2012-02-15 DIAGNOSIS — K5900 Constipation, unspecified: Secondary | ICD-10-CM

## 2012-02-15 DIAGNOSIS — K5730 Diverticulosis of large intestine without perforation or abscess without bleeding: Secondary | ICD-10-CM

## 2012-02-15 DIAGNOSIS — K5909 Other constipation: Secondary | ICD-10-CM

## 2012-02-15 NOTE — Patient Instructions (Signed)
Continue all medication.

## 2012-02-15 NOTE — Progress Notes (Signed)
This is a very pleasant 78 year old African American male with chronic functional constipation and severe diverticulosis. Recent colonoscopy otherwise was unremarkable. He is currently asymptomatic on daily Metamucil, MiraLax at bedtime, and liberal by mouth fluids with a high fiber diet. He was on probiotic therapy which he has discontinued. The patient denies upper gastrointestinal or hepatobiliary or systemic complaints. His appetite is good and his weight is stable.  Current Medications, Allergies, Past Medical History, Past Surgical History, Family History and Social History were reviewed in [employer] record.  Pertinent Review of Systems Negative   Physical Exam: Healthy-appearing patient in no distress. Blood pressure 134/80, pulse 60 and regular, weight under 93 pounds, and BMI 26.18. I cannot appreciate stigmata of chronic liver disease. Abdominal exam shows no distention, organomegaly, masses or tenderness. Bowel sounds are nonobstructive. Mental status is normal.    Assessment and Plan: Diverticulosis currently much improved on medical therapy and dietary therapy. I have reviewed his medications with him and we'll see him on a when necessary basis as needed. He is followed closely by Dr.Moreira who regulates his thyroid and blood pressure medications. No diagnosis found.

## 2012-03-14 ENCOUNTER — Telehealth: Payer: Self-pay | Admitting: Gastroenterology

## 2012-03-14 NOTE — Telephone Encounter (Signed)
Pt had COLON on 01/08/12 that was unremarkable except for diverticulosis. F/U visit on 02/15/12 and he was to continue Miralax and Metamucil. Pt reports he continues to take Miralax at night and metamucil in the am. Pt reports he feels as though he is not emptying his bowels and he feels constipated again. Please advise. Thanks.

## 2012-03-15 MED ORDER — LINACLOTIDE 145 MCG PO CAPS: 145.0000 ug | ORAL_CAPSULE | Freq: Every day | ORAL | Status: DC

## 2012-03-15 NOTE — Telephone Encounter (Signed)
Informed pt of Dr Norval Gable instructions for him to try Linzess; left him samples at the front desk. Pt stated understanding.

## 2012-03-15 NOTE — Telephone Encounter (Signed)
lmom for pt to call back

## 2012-03-15 NOTE — Telephone Encounter (Signed)
Can try Linzess 145/day

## 2012-06-07 ENCOUNTER — Other Ambulatory Visit: Payer: Self-pay | Admitting: Gastroenterology

## 2012-08-08 ENCOUNTER — Encounter (HOSPITAL_COMMUNITY)
Admission: AD | Disposition: A | Discharge status: Home / Self Care | Payer: Self-pay | Source: Ambulatory Visit | Attending: Cardiology

## 2012-08-08 ENCOUNTER — Encounter (HOSPITAL_COMMUNITY): Payer: Self-pay | Admitting: Anesthesiology

## 2012-08-08 ENCOUNTER — Inpatient Hospital Stay (HOSPITAL_COMMUNITY): Payer: Medicare Other | Admitting: Anesthesiology

## 2012-08-08 ENCOUNTER — Encounter (HOSPITAL_COMMUNITY): Payer: Self-pay | Admitting: *Deleted

## 2012-08-08 ENCOUNTER — Observation Stay (HOSPITAL_COMMUNITY)
Admission: AD | Admit: 2012-08-08 | Discharge: 2012-08-09 | DRG: 310 | Disposition: A | Discharge status: Home / Self Care | Payer: Medicare Other | Source: Ambulatory Visit | Attending: Cardiology | Admitting: Cardiology

## 2012-08-08 DIAGNOSIS — E785 Hyperlipidemia, unspecified: Secondary | ICD-10-CM | POA: Insufficient documentation

## 2012-08-08 DIAGNOSIS — I4891 Unspecified atrial fibrillation: Principal | ICD-10-CM | POA: Insufficient documentation

## 2012-08-08 DIAGNOSIS — I10 Essential (primary) hypertension: Secondary | ICD-10-CM | POA: Insufficient documentation

## 2012-08-08 DIAGNOSIS — K222 Esophageal obstruction: Secondary | ICD-10-CM | POA: Insufficient documentation

## 2012-08-08 DIAGNOSIS — K219 Gastro-esophageal reflux disease without esophagitis: Secondary | ICD-10-CM | POA: Insufficient documentation

## 2012-08-08 DIAGNOSIS — E039 Hypothyroidism, unspecified: Secondary | ICD-10-CM | POA: Insufficient documentation

## 2012-08-08 HISTORY — PX: CARDIOVERSION: SHX1299

## 2012-08-08 LAB — CBC WITH DIFFERENTIAL/PLATELET
Basophils Absolute: 0 10*3/uL (ref 0.0–0.1)
Basophils Relative: 1 % (ref 0–1)
Eosinophils Absolute: 0 10*3/uL (ref 0.0–0.7)
Eosinophils Relative: 1 % (ref 0–5)
MCH: 31.6 pg (ref 26.0–34.0)
MCHC: 34.3 g/dL (ref 30.0–36.0)
MCV: 92.1 fL (ref 78.0–100.0)
Neutrophils Relative %: 56 % (ref 43–77)
Platelets: 169 10*3/uL (ref 150–400)
RBC: 5.06 MIL/uL (ref 4.22–5.81)
RDW: 12.5 % (ref 11.5–15.5)

## 2012-08-08 LAB — COMPREHENSIVE METABOLIC PANEL
ALT: 18 U/L (ref 0–53)
AST: 25 U/L (ref 0–37)
CO2: 29 mEq/L (ref 19–32)
Calcium: 10 mg/dL (ref 8.4–10.5)
Creatinine, Ser: 0.75 mg/dL (ref 0.50–1.35)
GFR calc Af Amer: >90 mL/min (ref >90)
GFR calc non Af Amer: 88 mL/min — ABNORMAL LOW (ref >90)
Sodium: 140 mEq/L (ref 135–145)
Total Protein: 7.6 g/dL (ref 6.0–8.3)

## 2012-08-08 LAB — PROTIME-INR: Prothrombin Time: 13.7 seconds (ref 11.6–15.2)

## 2012-08-08 LAB — TROPONIN I: Troponin I: <0.3 ng/mL (ref <0.30)

## 2012-08-08 SURGERY — CARDIOVERSION
Anesthesia: Monitor Anesthesia Care | Wound class: Clean

## 2012-08-08 MED ORDER — NITROGLYCERIN 0.4 MG SL SUBL: 0.4000 mg | SUBLINGUAL_TABLET | SUBLINGUAL | Status: DC | PRN

## 2012-08-08 MED ORDER — ACETAMINOPHEN 325 MG PO TABS: 650.0000 mg | ORAL_TABLET | ORAL | Status: DC | PRN

## 2012-08-08 MED ORDER — SODIUM CHLORIDE 0.9 % IJ SOLN: 3.0000 mL | INTRAMUSCULAR | Status: DC | PRN

## 2012-08-08 MED ORDER — DABIGATRAN ETEXILATE MESYLATE 150 MG PO CAPS
150.0000 mg | ORAL_CAPSULE | Freq: Two times a day (BID) | ORAL | Status: DC
  Filled 2012-08-08: qty 1

## 2012-08-08 MED ORDER — METOPROLOL TARTRATE 1 MG/ML IV SOLN
5.0000 mg | Freq: Once | INTRAVENOUS | Status: AC
  Administered 2012-08-08: 5 mg via INTRAVENOUS
  Filled 2012-08-08: qty 5

## 2012-08-08 MED ORDER — ASPIRIN EC 81 MG PO TBEC
81.0000 mg | DELAYED_RELEASE_TABLET | Freq: Every day | ORAL | Status: DC
  Filled 2012-08-08: qty 1

## 2012-08-08 MED ORDER — SODIUM CHLORIDE 0.9 % IJ SOLN: 3.0000 mL | Freq: Two times a day (BID) | INTRAMUSCULAR | Status: DC

## 2012-08-08 MED ORDER — DABIGATRAN ETEXILATE MESYLATE 150 MG PO CAPS
150.0000 mg | ORAL_CAPSULE | Freq: Two times a day (BID) | ORAL | Status: DC
  Administered 2012-08-09: 150 mg via ORAL
  Filled 2012-08-08 (×3): qty 1

## 2012-08-08 MED ORDER — ONDANSETRON HCL 4 MG/2ML IJ SOLN: 4.0000 mg | Freq: Four times a day (QID) | INTRAMUSCULAR | Status: DC | PRN

## 2012-08-08 MED ORDER — PROPOFOL 10 MG/ML IV BOLUS
INTRAVENOUS | Status: DC | PRN
  Administered 2012-08-08: 70 mg via INTRAVENOUS

## 2012-08-08 MED ORDER — ASPIRIN 300 MG RE SUPP
300.0000 mg | RECTAL | Status: AC
  Filled 2012-08-08: qty 1

## 2012-08-08 MED ORDER — HYDRALAZINE HCL 10 MG PO TABS
10.0000 mg | ORAL_TABLET | Freq: Two times a day (BID) | ORAL | Status: DC
  Administered 2012-08-08 – 2012-08-09 (×2): 10 mg via ORAL
  Filled 2012-08-08 (×4): qty 1

## 2012-08-08 MED ORDER — LEVOTHYROXINE SODIUM 100 MCG PO TABS
100.0000 ug | ORAL_TABLET | Freq: Every day | ORAL | Status: DC
  Administered 2012-08-09: 100 ug via ORAL
  Filled 2012-08-08 (×2): qty 1

## 2012-08-08 MED ORDER — ENOXAPARIN SODIUM 100 MG/ML ~~LOC~~ SOLN
90.0000 mg | Freq: Once | SUBCUTANEOUS | Status: AC
  Administered 2012-08-08: 90 mg via SUBCUTANEOUS
  Filled 2012-08-08: qty 1

## 2012-08-08 MED ORDER — SODIUM CHLORIDE 0.9 % IV SOLN: 250.0000 mL | INTRAVENOUS | Status: DC | PRN

## 2012-08-08 MED ORDER — ENOXAPARIN SODIUM 100 MG/ML ~~LOC~~ SOLN
90.0000 mg | Freq: Two times a day (BID) | SUBCUTANEOUS | Status: DC
  Filled 2012-08-08: qty 1

## 2012-08-08 MED ORDER — ASPIRIN 81 MG PO CHEW
324.0000 mg | CHEWABLE_TABLET | ORAL | Status: AC
  Administered 2012-08-08: 324 mg via ORAL
  Filled 2012-08-08: qty 4

## 2012-08-08 MED ORDER — DEXTROSE-NACL 5-0.45 % IV SOLN
INTRAVENOUS | Status: DC
  Administered 2012-08-08: 1000 mL via INTRAVENOUS

## 2012-08-08 MED ORDER — NEBIVOLOL HCL 2.5 MG PO TABS
2.5000 mg | ORAL_TABLET | Freq: Every day | ORAL | Status: DC
  Filled 2012-08-08 (×2): qty 1

## 2012-08-08 MED ORDER — SODIUM CHLORIDE 0.9 % IV SOLN: 250.0000 mL | INTRAVENOUS | Status: DC

## 2012-08-08 NOTE — Anesthesia Postprocedure Evaluation (Signed)
  Anesthesia Post-op Note  Patient: Ian Weber  Procedure(s) Performed: Procedure(s) (LRB) with comments: CARDIOVERSION (N/A)  Patient Location: Nursing Unit  Anesthesia Type:General  Level of Consciousness: awake, alert  and oriented  Airway and Oxygen Therapy: Patient Spontanous Breathing and Patient connected to nasal cannula oxygen  Post-op Pain: none  Post-op Assessment: Post-op Vital signs reviewed, Patient's Cardiovascular Status Stable, Respiratory Function Stable, Patent Airway and No signs of Nausea or vomiting  Post-op Vital Signs: Reviewed and stable  Complications: No apparent anesthesia complications

## 2012-08-08 NOTE — Anesthesia Preprocedure Evaluation (Addendum)
Anesthesia Evaluation  Patient identified by MRN, date of birth, ID band Patient awake    Reviewed: Allergy & Precautions, H&P , NPO status , Patient's Chart, lab work & pertinent test results  History of Anesthesia Complications Negative for: history of anesthetic complications  Airway Mallampati: II TM Distance: >3 FB Neck ROM: Full    Dental No notable dental hx. (+) Teeth Intact, Dental Advisory Given and Missing   Pulmonary neg pulmonary ROS,  breath sounds clear to auscultation  Pulmonary exam normal       Cardiovascular hypertension, Pt. on medications and Pt. on home beta blockers Rhythm:Irregular Rate:Normal  10/12 stress test: no ischemia and normal LVF   Neuro/Psych PSYCHIATRIC DISORDERS Depression negative neurological ROS     GI/Hepatic hiatal hernia, GERD-  Medicated and Controlled,  Endo/Other  Hypothyroidism   Renal/GU      Musculoskeletal   Abdominal   Peds  Hematology   Anesthesia Other Findings   Reproductive/Obstetrics                         Anesthesia Physical Anesthesia Plan  ASA: III  Anesthesia Plan: General   Post-op Pain Management:    Induction: Intravenous  Airway Management Planned: Mask  Additional Equipment:   Intra-op Plan:   Post-operative Plan:   Informed Consent: I have reviewed the patients History and Physical, chart, labs and discussed the procedure including the risks, benefits and alternatives for the proposed anesthesia with the patient or authorized representative who has indicated his/her understanding and acceptance.   Dental advisory given  Plan Discussed with: Surgeon and CRNA  Anesthesia Plan Comments: (Plan routine monitors, GA for cardioversion)       Anesthesia Quick Evaluation

## 2012-08-08 NOTE — Transfer of Care (Signed)
Immediate Anesthesia Transfer of Care Note  Patient: Ian Weber  Procedure(s) Performed: Procedure(s) (LRB) with comments: CARDIOVERSION (N/A)  Patient Location: Nursing Unit  Anesthesia Type:General  Level of Consciousness: awake, alert  and oriented  Airway & Oxygen Therapy: Patient Spontanous Breathing and Patient connected to nasal cannula oxygen  Post-op Assessment: Report given to PACU RN, Post -op Vital signs reviewed and stable and Patient moving all extremities  Post vital signs: Reviewed and stable  Complications: No apparent anesthesia complications

## 2012-08-08 NOTE — Progress Notes (Addendum)
ANTICOAGULATION CONSULT NOTE - Initial Consult  Pharmacy Consult for Lovenox Indication: atrial fibrillation  Allergies  Allergen Reactions  . Codeine Phosphate     REACTION: weakness, sweating  . Prednisone     REACTION: unspecified  . Sulfamethoxazole     REACTION: itching    Patient Measurements:  Weight: 88kg Height: 72in  Vital Signs:    Labs: No results found for this basename: HGB:2,HCT:3,PLT:3,APTT:3,LABPROT:3,INR:3,HEPARINUNFRC:3,CREATININE:3,CKTOTAL:3,CKMB:3,TROPONINI:3 in the last 72 hours  CrCl is unknown because no creatinine reading has been taken.   Medical History: Past Medical History  Diagnosis Date  . Hypothyroidism   . Unspecified essential hypertension   . Stricture and stenosis of esophagus   . Esophageal reflux   . Hiatal hernia   . Thyroid cancer   . Diverticulosis of colon (without mention of hemorrhage)   . Irritable bowel syndrome   . Hypertension   . Depression   . Lactose intolerance     Medications:  Prescriptions prior to admission  Medication Sig Dispense Refill  . hydrALAZINE (APRESOLINE) 10 MG tablet Take 10 mg by mouth 2 (two) times daily.       Marland Kitchen levothyroxine (SYNTHROID, LEVOTHROID) 100 MCG tablet Take 100 mcg by mouth daily.      . Linaclotide (LINZESS) 145 MCG CAPS Take 145 mcg by mouth daily as needed. For constipation      . nebivolol (BYSTOLIC) 2.5 MG tablet Take 2.5 mg by mouth daily.      . polyethylene glycol (MIRALAX / GLYCOLAX) packet Take 17 g by mouth daily as needed. For constipation      . psyllium (METAMUCIL) 58.6 % packet Take 1 packet by mouth daily.        Assessment: 77yom to start therapeutic Lovenox for new-onset Afib. Patient reports no bleeding and not taking any anticoagulants. - Wt: 88kg - BMET and INR pending  Goal of Therapy:  Anti-Xa level 0.6-1.2 units/ml 4hrs after LMWH dose given Monitor platelets by anticoagulation protocol: Yes   Plan:  1. Lovenox 90mg  SQ q12h - follow-up SCr prior  to second dose 2. Follow-up BMET 3. CBC q72h  Cleon Dew [phone] 08/08/2012,3:04 PM

## 2012-08-08 NOTE — H&P (Signed)
Ian Weber is an 78 y.o. male.   Chief Complaint: Chest pain HPI: Mr. Ian Weber is a fairly active 78 year old African American male with history of hypertension and otherwise no significant prior cardiovascular history, history of remote thyroid cancer about 25 years ago status post thyroidectomy and on hormone replacement therapy, had been doing well until this morning had his routine physical examination.  He received a flu shot this morning.  When he was traveling back home, he started to experience chest discomfort.  He presented back to Dr. Gardenia Phlegm office stating that his chest was hurting not deep but just in the superficial part and pointed his finger to the left upper part of his chest rate and EKG was obtained and this revealed atrial fibrillation with rapid ventricular response. Patient is states that the chest discomfort is in the left upper part of the chest and state that he can pretty much rub on it.  It is dull.  Described as mild.  There was no associated diaphoresis, nausea vomiting, dizziness, palpitations.  No the symptoms were described to me today.  He looks very comfortable otherwise.  Past Medical History  Diagnosis Date  . Hypothyroidism   . Uncontrolled hypertension as indication for native nephrectomy   . Stricture and stenosis of esophagus   . Esophageal reflux   . Hiatal hernia   . Thyroid cancer   . Diverticulosis of colon (without mention of hemorrhage)   . Irritable bowel syndrome   . Hypertension   . Depression   . Lactose intolerance     Past Surgical History  Procedure Date  . Thyroidectomy 1990    Dr Orson Slick  . Hernia repair 1992  . Colonoscopy     No family history on file. Social History:  reports that he has never smoked. He has never used smokeless tobacco. He reports that he does not drink alcohol or use illicit drugs.  Allergies:  Allergies  Allergen Reactions  . Codeine Phosphate     REACTION: weakness, sweating  . Prednisone       REACTION: unspecified  . Sulfamethoxazole     REACTION: itching    ROS: General:Present- Feeling well. Not Present- Anorexia, Appetite Loss, Fatigue, Fever and Weight Gain. HEENT:Not Present- Headache and Blurred Vision. Respiratory:Not Present- Bloody sputum and Hemoptysis. Cardiovascular:Not Present- Claudications, Difficulty Breathing On Exertion, Fainting and Leg Cramps. Gastrointestinal:Not Present- Abdominal Pain, Black, Tarry Stool, Bloody Stool and Difficulty Swallowing. Musculoskeletal:Not Present- Joint Pain, Leg Cramps and Leg Weakness. Neurological:Not Present- Decreased Memory, Difficulty Speaking, Dysesthesia, Easily Distracted and Focal Neurological Symptoms. Psychiatric:Not Present- Anxiety, Depression, Suicidal Ideation and Suicidal Planning. Endocrine:Not Present- Cold Intolerance, Heat Intolerance, Polydipsia and Polyuria. Hematology:Not Present- Abnormal Bleeding, Easy Bruising and Easy Bleeding. All other systems negative    Exam:   Vitals(Erin Rousey; 08/08/2012 1:02 PM) 08/08/2012 12:47 PM Weight: 194 lb Height: 72 in Body Surface Area: 2.11 m Body Mass Index: 26.31 kg/m Pulse: 80 (Regular) BP: 136/90 (Sitting, Left Arm, Standard)   Physical Exam(Jagadeesh Tomasita Crumble, MD; 08/08/2012 1:40 PM) The physical exam findings are as follows:  General Mental Status- Alert. General Appearance- Cooperative, Well groomed and Consistent with stated age. Not in acute distress. Orientation- Oriented to time, Oriented to place, Oriented to purpose and Oriented to person. Build & Nutrition- Well nourishedand Well developed. Hydration- Well hydrated.  Integumentary General Characteristics:Overall examination of the patient's skin reveals - no suspicious lesions, no bruises and no evidence of scars. Color- normal coloration of skin. Skin Moisture-  normal skin moisture.  Head and Neck Head- normocephalic, atraumatic with no lesions or  palpable masses. Neck Global Assessment- full range of motion and supple. no bruit auscultated on the right, no bruit auscultated on the left, non-tender, no lymphadenopathy, no palpable mass on the right, no palpable mass on the left and no nucchal rigidty.   Chest and Lung Exam Chest and lung exam reveals - normal excursion with symmetric chest walls, quiet, even and easy respiratory effort with no use of accessory muscles and on auscultation, normal breath sounds, no adventitious sounds and normal vocal resonance.   Cardiovascular Inspection:Jugular vein- Left- Inspection Normal. Right- Inspection Normal. Palpation/Percussion:Carotid Artery- Left- Normal pulsations. Right- Normal pulsations. Auscultation:Rhythm- Irregularly irregular. Tachycardic.  Heart Sounds- Normal heart sounds. Murmurs & Other Heart Sounds:Auscultation of the heart reveals - No Murmurs. Carotid arteries- No Carotid bruit.   Abdomen Inspection:Inspection of the abdomen reveals - No Visible peristalsis, No Abnormal pulsations, No Paradoxical movements and No Hernias. Skin:Inspection of the skin of the abdomen reveals - No Stria and No Scars. Palpation/Percussion:Palpation and Percussion of the abdomen reveal - Non Tender, No Rebound tenderness, No Rigidity (guarding), No Abnormal dullness to percussion and No Palpable abdominal masses. Auscultation:Auscultation of the abdomen reveals - Bowel sounds normal and No Abdominal bruits.   Peripheral Vascular Upper Extremity:Inspection- Left- Pink nail beds. No Digital clubbing. Right- Pink nail beds. No Digital clubbing. Palpation:Temperature- Left- Warm. Right- Warm. Tenderness- Left- Non Tender. Right- Non Tender. Edema- Left- No edema. Right- No edema. Lower Extremity:Inspection- Left- Pink skin. No Cyanotic nailbeds or Ulcerations. Right- Pink skin. No Cyanotic nailbeds or Ulcerations. Bilateral- Varicose veins (No skin changes  or ulcerations.). Palpation:Femoral pulse- Bilateral- Normal. Dorsalis pedis pulse- Bilateral- Normal. Auscultation- No Bilateral Groin Bruits.   NeurologicMotor:Strength:5/5 normal muscle strength- All Muscles.    Labs:   Lab Results  Component Value Date   WBC 3.6* 10/22/2009   HGB 13.5 10/22/2009   HCT 41.1 10/22/2009   MCV 95.6 10/22/2009   PLT 185.0 10/22/2009    Assessment/Plan Atrial fibrillation (427.31) Story: EKG done on 05/03/2012 had revealed sinus bradycardia at rate of 44 beats a minute, early repolarization. EKG done this morning on 08/08/2012 revealed atrial fibrillation with rapid ventricular response, leftward to hypertrophy and early repolarization. There was no ST-T wave changes to suggest myocardial ischemia.  Stress Myoview: 04/14/11: Ex duration 6:30 min, 10 METS. Normal perfusion. No ischemia. normal LVEF Impression: After evaluation of the patient, EKG in spite of rapid ventricular response, does not appear to show any significant evidence of myocardial ischemia. He is otherwise essentially asymptomatic very active 78 year old gentleman who was earlier seen this morning and was found to be in routine good health and then developed chest discomfort which I suspect is due to atrial fibrillation with rapid ventricular response. I have recommended that we admit him to the hospital for at least an overnight observation given his chest pain, age, hyperlipidemia and hypertension. I will rule out myocardial infarction by serial cardiac markers in also set him up for a cardioversion this afternoon. Patient had not eaten anything since last night 10 PM. Schedule for Direct current cardioversion. I have discussed regarding risks benefits rate control vs rhythm control with the patient. Patient understands cardiac arrest and need for CPR, aspiration pneumonia, but not limited to these. Patient is willing. 2. Hypertension. 3. Hyperlipidemia. 04/27/12: Total cholesterol is  104, triglycerides 30, HDL 52, LDL was 96. 4. Hypothyroidism. 04/27/2012 and revealed normal CBC. TSH was normal so was  T3 and T4.  [name], MD 08/08/2012, 1:46 PM [facility] Pager: [phone] Office: [phone] If no answer: Cell:  [phone]

## 2012-08-08 NOTE — Interval H&P Note (Signed)
History and Physical Interval Note:  08/08/2012 5:42 PM  Ian Weber  has presented today for surgery, with the diagnosis of a fib  The various methods of treatment have been discussed with the patient and family. After consideration of risks, benefits and other options for treatment, the patient has consented to  Procedure(s) (LRB) with comments: CARDIOVERSION (N/A) as a surgical intervention .  The patient's history has been reviewed, patient examined, no change in status, stable for surgery.  I have reviewed the patient's chart and labs.  Questions were answered to the patient's satisfaction.     [facility] R  Please note Pulse rate was recorded as 80 regular on initial HPI, it was 80-110/min, irregular.

## 2012-08-08 NOTE — Interval H&P Note (Signed)
History and Physical Interval Note:  08/08/2012 5:41 PM  Ian Weber  has presented today for surgery, with the diagnosis of a fib  The various methods of treatment have been discussed with the patient and family. After consideration of risks, benefits and other options for treatment, the patient has consented to  Procedure(s) (LRB) with comments: CARDIOVERSION (N/A) as a surgical intervention .  The patient's history has been reviewed, patient examined, no change in status, stable for surgery.  I have reviewed the patient's chart and labs.  Questions were answered to the patient's satisfaction.     [name]

## 2012-08-08 NOTE — Preoperative (Signed)
Beta Blockers   Reason not to administer Beta Blockers:Bystolic 08/07/12

## 2012-08-08 NOTE — CV Procedure (Signed)
Direct current cardioversion:  Indication symptomatic A. Fibrillation.  Procedure: Using 60 mg of IV Propofol for achieving deep (Moderate sedation), synchronized direct current cardioversion performed. Patient was delivered with 120 Joules of electricity X 1 with success to NSR. Patient tolerated the procedure well. No immediate complication noted.

## 2012-08-08 NOTE — Progress Notes (Signed)
Utilization review completed.  

## 2012-08-09 ENCOUNTER — Encounter (HOSPITAL_COMMUNITY): Payer: Self-pay | Admitting: Cardiology

## 2012-08-09 LAB — TSH: TSH: 0.874 u[IU]/mL (ref 0.350–4.500)

## 2012-08-09 MED ORDER — DABIGATRAN ETEXILATE MESYLATE 150 MG PO CAPS: 150.0000 mg | ORAL_CAPSULE | Freq: Two times a day (BID) | ORAL | Status: DC

## 2012-08-09 NOTE — Discharge Summary (Signed)
Physician Discharge Summary  Patient ID: Ian Weber MRN: [id number] DOB/AGE: [id number] 76 y.o.  Admit date: 08/08/2012 Discharge date: 08/09/2012  Primary Discharge Diagnosis atrial fibrillation with rapid ventricular response. Chest pain probably musculoskeletal Secondary Discharge Diagnosis Hypertension Hyperlipidemia  Significant Diagnostic Studies:  Consults:   Hospital Course: Patient was seen in the outpatient setting by me on 08/08/2012, presented with new onset of atrial fibrillation presenting with symptoms of chest pain. I admitted him to the hospital with the same day and obtained cardiac troponins, rule out for coronary ischemia/acute coronary syndrome and felt that he was stable for cardioversion. Performed synchronized direct current cardioversion the same evening, patient converted from atrial fibrillation to sinus rhythm with her and 20 J of electricity delivered capsule the patient. He maintained sinus rhythm with first-degree block throughout the hospital stay. The following morning he was felt stable for discharge. Patient will be started on Pradaxa 150 mg by mouth twice a day for probable long-term anticoagulation. I will reconsider this on his office visit to see whether he may be taken off of the long-term anticoagulation. However given his age, hypertension as CV risks, he probably will benefit from long-term.. No changes in the medications were done except stopping aspirin.   Recommendations on discharge:  Patient will see me back in the office on November 14 at 8:30 AM. His address to contact Dr. Ralene Ok for followup from medical standpoint.  Discharge Exam: Blood pressure 120/78, pulse 48, temperature 97.6 F (36.4 C), temperature source Oral, resp. rate 18, height 6' (1.829 m), weight 87.454 kg (192 lb 12.8 oz), SpO2 100.00%.    General appearance: alert, cooperative, appears stated age and no distress Resp: clear to auscultation bilaterally Chest wall:  no tenderness Cardio: regular rate and rhythm, S1, S2 normal, no murmur, click, rub or gallop GI: soft, non-tender; bowel sounds normal; no masses,  no organomegaly Labs:   Lab Results  Component Value Date   WBC 4.0 08/08/2012   HGB 16.0 08/08/2012   HCT 46.6 08/08/2012   MCV 92.1 08/08/2012   PLT 169 08/08/2012    Lab 08/08/12 1456  NA 140  K 5.2*  CL 103  CO2 29  BUN 12  CREATININE 0.75  CALCIUM 10.0  PROT 7.6  BILITOT 0.7  ALKPHOS 71  ALT 18  AST 25  GLUCOSE 103*   Lab Results  Component Value Date   TROPONINI <0.30 08/08/2012    Lipid Panel  No results found for this basename: chol, trig, hdl, cholhdl, vldl, ldlcalc    EKG: sinus bradycardia, 1st degree AV block, incomplete right bundle branch block, LVH, no evidence of ischemia.  FOLLOW UP PLANS AND APPOINTMENTS    Medication List     As of 08/09/2012  8:18 AM    TAKE these medications         dabigatran 150 MG Caps   Commonly known as: PRADAXA   Take 1 capsule (150 mg total) by mouth every 12 (twelve) hours.      hydrALAZINE 10 MG tablet   Commonly known as: APRESOLINE   Take 10 mg by mouth 2 (two) times daily.      levothyroxine 100 MCG tablet   Commonly known as: SYNTHROID, LEVOTHROID   Take 100 mcg by mouth daily.      Linaclotide 145 MCG Caps   Commonly known as: LINZESS   Take 145 mcg by mouth daily as needed. For constipation      nebivolol 2.5 MG tablet  Commonly known as: BYSTOLIC   Take 2.5 mg by mouth daily.      polyethylene glycol packet   Commonly known as: MIRALAX / GLYCOLAX   Take 17 g by mouth daily as needed. For constipation      psyllium 58.6 % packet   Commonly known as: METAMUCIL   Take 1 packet by mouth daily.           Follow-up Information    Call Ralene Ok, MD.   Contact information:   [address] [place] [address] [phone]       Follow up with [name], MD. On 08/22/2012. (8:30 AM.)    Contact information:   [address]  [place] [address] [phone]           [name], MD 08/09/2012, 8:18 AM  Pager: [phone] Office: [phone] If no answer: [phone]

## 2012-11-19 ENCOUNTER — Emergency Department (HOSPITAL_COMMUNITY): Payer: Medicare Other

## 2012-11-19 ENCOUNTER — Encounter (HOSPITAL_COMMUNITY): Payer: Self-pay | Admitting: Emergency Medicine

## 2012-11-19 ENCOUNTER — Emergency Department (HOSPITAL_COMMUNITY)
Admission: EM | Admit: 2012-11-19 | Discharge: 2012-11-19 | Disposition: A | Discharge status: Home / Self Care | Payer: Medicare Other | Attending: Emergency Medicine | Admitting: Emergency Medicine

## 2012-11-19 DIAGNOSIS — E039 Hypothyroidism, unspecified: Secondary | ICD-10-CM | POA: Insufficient documentation

## 2012-11-19 DIAGNOSIS — Z85850 Personal history of malignant neoplasm of thyroid: Secondary | ICD-10-CM | POA: Insufficient documentation

## 2012-11-19 DIAGNOSIS — Z8659 Personal history of other mental and behavioral disorders: Secondary | ICD-10-CM | POA: Insufficient documentation

## 2012-11-19 DIAGNOSIS — R0789 Other chest pain: Secondary | ICD-10-CM | POA: Insufficient documentation

## 2012-11-19 DIAGNOSIS — R002 Palpitations: Secondary | ICD-10-CM | POA: Insufficient documentation

## 2012-11-19 DIAGNOSIS — I10 Essential (primary) hypertension: Secondary | ICD-10-CM | POA: Insufficient documentation

## 2012-11-19 DIAGNOSIS — Z8719 Personal history of other diseases of the digestive system: Secondary | ICD-10-CM | POA: Insufficient documentation

## 2012-11-19 DIAGNOSIS — R079 Chest pain, unspecified: Secondary | ICD-10-CM

## 2012-11-19 LAB — PROTIME-INR
INR: 1.37 (ref 0.00–1.49)
Prothrombin Time: 16.5 seconds — ABNORMAL HIGH (ref 11.6–15.2)

## 2012-11-19 LAB — CBC WITH DIFFERENTIAL/PLATELET
Basophils Relative: 1 % (ref 0–1)
HCT: 44.5 % (ref 39.0–52.0)
Hemoglobin: 15.6 g/dL (ref 13.0–17.0)
Lymphocytes Relative: 29 % (ref 12–46)
MCHC: 35.1 g/dL (ref 30.0–36.0)
Monocytes Absolute: 0.3 10*3/uL (ref 0.1–1.0)
Monocytes Relative: 8 % (ref 3–12)
Neutro Abs: 2.3 10*3/uL (ref 1.7–7.7)

## 2012-11-19 LAB — URINALYSIS, ROUTINE W REFLEX MICROSCOPIC
Bilirubin Urine: NEGATIVE
Protein, ur: NEGATIVE mg/dL
Urobilinogen, UA: 0.2 mg/dL (ref 0.0–1.0)

## 2012-11-19 LAB — COMPREHENSIVE METABOLIC PANEL
BUN: 12 mg/dL (ref 6–23)
CO2: 25 mEq/L (ref 19–32)
Chloride: 102 mEq/L (ref 96–112)
Creatinine, Ser: 0.73 mg/dL (ref 0.50–1.35)
GFR calc Af Amer: >90 mL/min (ref >90)
GFR calc non Af Amer: 88 mL/min — ABNORMAL LOW (ref >90)
Total Bilirubin: 0.4 mg/dL (ref 0.3–1.2)

## 2012-11-19 LAB — POCT I-STAT TROPONIN I: Troponin i, poc: 0 ng/mL (ref 0.00–0.08)

## 2012-11-19 MED ORDER — SODIUM CHLORIDE 0.9 % IV SOLN
20.0000 mL | INTRAVENOUS | Status: DC
  Administered 2012-11-19: 20 mL via INTRAVENOUS

## 2012-11-19 MED ORDER — NITROGLYCERIN 0.4 MG SL SUBL
0.4000 mg | SUBLINGUAL_TABLET | SUBLINGUAL | Status: DC | PRN
  Administered 2012-11-19: 0.4 mg via SUBLINGUAL

## 2012-11-19 MED ORDER — ASPIRIN 81 MG PO CHEW
324.0000 mg | CHEWABLE_TABLET | Freq: Once | ORAL | Status: AC
  Administered 2012-11-19: 324 mg via ORAL
  Filled 2012-11-19: qty 4

## 2012-11-19 NOTE — ED Notes (Signed)
Called phlebotomy to get blood drawn

## 2012-11-19 NOTE — ED Provider Notes (Signed)
History     CSN: [id number]  Arrival date & time 11/19/12  1020   First MD Initiated Contact with Patient 11/19/12 1113      Chief Complaint  Patient presents with  . Chest Pain    (Consider location/radiation/quality/duration/timing/severity/associated sxs/prior treatment) HPI Patient complaining of chest pain today began while walking left anterior chest after walked.  Denies sob, sweaty, or lightheaded.  Pain still present, 2/10, was 2/10 at worse.  Denies prior chest pain.  Went to Dr. Verl Dicker office and told to come to ed.  Patient took regular meds today.  Patient on xarelto at night.  Patient describes pain as crampy.   Past Medical History  Diagnosis Date  . Hypothyroidism   . Unspecified essential hypertension   . Stricture and stenosis of esophagus   . Esophageal reflux   . Hiatal hernia   . Thyroid cancer   . Diverticulosis of colon (without mention of hemorrhage)   . Irritable bowel syndrome   . Hypertension   . Depression   . Lactose intolerance     Past Surgical History  Procedure Laterality Date  . Thyroidectomy  1990    Dr Orson Slick  . Hernia repair  1992  . Colonoscopy    . Cardioversion  08/08/2012    Procedure: CARDIOVERSION;  Surgeon: [name], MD;  Location: [facility] OR;  Service: Cardiovascular;  Laterality: N/A;    No family history on file.  History  Substance Use Topics  . Smoking status: Never Smoker   . Smokeless tobacco: Never Used  . Alcohol Use: No      Review of Systems  Constitutional: Negative.   HENT: Negative.   Eyes: Negative.   Respiratory: Negative.   Cardiovascular: Positive for chest pain and leg swelling.  Gastrointestinal: Positive for constipation.  Endocrine: Negative.   Genitourinary: Negative.   Musculoskeletal: Negative.   Skin: Negative.   Allergic/Immunologic: Negative.   Neurological: Negative.   Hematological: Negative.   Psychiatric/Behavioral: Negative.   All other systems reviewed and are  negative.    Allergies  Codeine phosphate; Prednisone; and Sulfamethoxazole  Home Medications   Current Outpatient Rx  Name  Route  Sig  Dispense  Refill  . dabigatran (PRADAXA) 150 MG CAPS   Oral   Take 1 capsule (150 mg total) by mouth every 12 (twelve) hours.   60 capsule   0   . hydrALAZINE (APRESOLINE) 10 MG tablet   Oral   Take 10 mg by mouth 2 (two) times daily.          Marland Kitchen levothyroxine (SYNTHROID, LEVOTHROID) 100 MCG tablet   Oral   Take 100 mcg by mouth daily.         . Linaclotide (LINZESS) 145 MCG CAPS   Oral   Take 145 mcg by mouth daily as needed. For constipation         . nebivolol (BYSTOLIC) 2.5 MG tablet   Oral   Take 2.5 mg by mouth daily.         . polyethylene glycol (MIRALAX / GLYCOLAX) packet   Oral   Take 17 g by mouth daily as needed. For constipation         . psyllium (METAMUCIL) 58.6 % packet   Oral   Take 1 packet by mouth daily.           BP 161/80  Pulse 62  Resp 20  SpO2 99%  Physical Exam  Nursing note and vitals reviewed. Constitutional: He is  oriented to person, place, and time. He appears well-developed and well-nourished.  HENT:  Head: Normocephalic and atraumatic.  Right Ear: External ear normal.  Left Ear: External ear normal.  Nose: Nose normal.  Mouth/Throat: Oropharynx is clear and moist.  Eyes: Conjunctivae and EOM are normal. Pupils are equal, round, and reactive to light.  Neck: Normal range of motion. Neck supple.  Cardiovascular: Normal rate, regular rhythm, normal heart sounds and intact distal pulses.   Pulmonary/Chest: Effort normal and breath sounds normal.  Abdominal: Soft. Bowel sounds are normal.  Musculoskeletal: Normal range of motion.  Neurological: He is alert and oriented to person, place, and time.  Skin: Skin is warm and dry.  Psychiatric: He has a normal mood and affect. His behavior is normal. Judgment and thought content normal.    ED Course  Procedures (including critical  care time)  Labs Reviewed  CBC WITH DIFFERENTIAL  BASIC METABOLIC PANEL   Dg Chest 2 View  11/19/2012  *RADIOLOGY REPORT*  Clinical Data: Left-sided chest pain  CHEST - 2 VIEW  Comparison: None.  Findings: Cardiomediastinal silhouette is unremarkable. Degenerative changes thoracic spine.  No acute infiltrate or pleural effusion.  No pulmonary edema.  Surgical clips are noted soft tissue neck.  IMPRESSION: No active disease.  Degenerative changes thoracic spine.   Original Report Authenticated By: Natasha Mead, M.D.      No diagnosis found.  Vent. rate 68 BPM PR interval 154 ms QRS duration 90 ms QT/QTc 394/418 ms P-R-T axes 76 -7 21   Date: 11/19/2012  Rate: 68  Rhythm: normal sinus rhythm  QRS Axis: left  Intervals: normal  ST/T Wave abnormalities: nonspecific ST changes  Conduction Disutrbances:none  Narrative Interpretation:   Old EKG Reviewed:rate increased, pvc present    MDM  Patient with pain resolved prior to nitro.  Dr. Jacinto Halim saw and assessed and feels he is stable for discharge.  He will arrange for follow up.        Hilario Quarry, MD 11/19/12 1318

## 2012-11-19 NOTE — Consult Note (Addendum)
CARDIOLOGY CONSULT NOTE  Patient ID: Ian Weber MRN: [id number] DOB/AGE: 1933/12/09 79 y.o.  Admit date: 11/19/2012 Referring Physician  D, Rosalia Hammers, MD  Primary Physician:  Ralene Ok, MD Reason for Consultation  Chest pain and palpitation  HPI: Patient is a 79 year old African American male with history of hypertension, history of atrial fibrillation and was hand cardioversion about 6 months ago doing well until recently when his medications were switched. Patient was started on Maxzide. Since then he has noticed heart racing, palpitations, not feeling well. This morning he had gone for a walk after he had taken the medication and while returning back home started noticing that his heart was racing. She also noticed left upper chest discomfort. He describes this pain as superficial, like he always has, that he pulled a muscle, but more importantly he checked his blood pressure and it was 208/76 mmHg and hence felt that he is to be seen. He presented to my office, due to his ongoing symptoms and I was no hospital, he was advised to go to the emergency room. I was called to see the patient to evaluate further. He states that he is asymptomatic now. On initial presentation his blood pressure was slightly elevated around 170 mm mercury. He states that he was having palpitations and presented to the emergency room, however he states that his chest pain was not A. He did receive one sublingual nitroglycerin. When I saw him just now, he states that he is asymptomatic. There was no shortness of breath, no TIA, no history of leg edema or painful swelling of the lower extremity.  Past Medical History  Diagnosis Date  . Hypothyroidism   . Unspecified essential hypertension   . Stricture and stenosis of esophagus   . Esophageal reflux   . Hiatal hernia   . Thyroid cancer   . Diverticulosis of colon (without mention of hemorrhage)   . Irritable bowel syndrome   . Hypertension   . Depression   . Lactose  intolerance      Past Surgical History  Procedure Laterality Date  . Thyroidectomy  1990    Dr Orson Slick  . Hernia repair  1992  . Colonoscopy    . Cardioversion  08/08/2012    Procedure: CARDIOVERSION;  Surgeon: [name], MD;  Location: [facility] OR;  Service: Cardiovascular;  Laterality: N/A;     No family history on file.   Social History: History   Social History  . Marital Status: Married    Spouse Name: N/A    Number of Children: N/A  . Years of Education: N/A   Occupational History  . retired    Social History Main Topics  . Smoking status: Never Smoker   . Smokeless tobacco: Never Used  . Alcohol Use: No  . Drug Use: No  . Sexually Active: Yes   Other Topics Concern  . Not on file   Social History Narrative  . No narrative on file      (Not in a hospital admission)  Scheduled Meds:  Continuous Infusions: . sodium chloride 20 mL (11/19/12 1202)   PRN Meds:.nitroGLYCERIN  ROS: General: no fevers/chills/night sweats Eyes: no blurry vision, diplopia, or amaurosis ENT: no sore throat or hearing loss Resp: no cough, wheezing, or hemoptysis GI: no abdominal pain, nausea, vomiting, diarrhea, or constipation GU: no dysuria, frequency, or hematuria Skin: no rash Neuro: no headache, numbness, tingling, or weakness of extremities Musculoskeletal: no joint pain or swelling Heme: no bleeding, DVT, or easy bruising  Endo: no polydipsia or polyuria    Physical Exam: Blood pressure 148/84, pulse 63, resp. rate 16, SpO2 100.00%.   General appearance: alert, cooperative, appears stated age and no distress Neck: no adenopathy, no carotid bruit, no JVD, supple, symmetrical, trachea midline and thyroid not enlarged, symmetric, no tenderness/mass/nodules Lungs: clear to auscultation bilaterally Heart: regular rate and rhythm, S1, S2 normal, no murmur, click, rub or gallop Abdomen: soft, non-tender; bowel sounds normal; no masses,  no organomegaly Extremities:  extremities normal, atraumatic, no cyanosis or edema Pulses: 2+ and symmetric  Labs:   Lab Results  Component Value Date   WBC 3.7* 11/19/2012   HGB 15.6 11/19/2012   HCT 44.5 11/19/2012   MCV 90.6 11/19/2012   PLT 154 11/19/2012    Recent Labs Lab 11/19/12 1133  NA 138  K 4.6  CL 102  CO2 25  BUN 12  CREATININE 0.73  CALCIUM 9.7  PROT 7.6  BILITOT 0.4  ALKPHOS 59  ALT 22  AST 27  GLUCOSE 88   Lab Results  Component Value Date   TROPONINI <0.30 08/08/2012    Lipid Panel  No results found for this basename: chol, trig, hdl, cholhdl, vldl, ldlcalc    EKG: normal EKG, normal sinus rhythm, unchanged from previous tracings. PVC. Normal intervals.    Radiology: Dg Chest 2 View  11/19/2012  *RADIOLOGY REPORT*  Clinical Data: Left-sided chest pain  CHEST - 2 VIEW  Comparison: None.  Findings: Cardiomediastinal silhouette is unremarkable. Degenerative changes thoracic spine.  No acute infiltrate or pleural effusion.  No pulmonary edema.  Surgical clips are noted soft tissue neck.  IMPRESSION: No active disease.  Degenerative changes thoracic spine.   Original Report Authenticated By: Natasha Mead, M.D.    Dg Chest Portable 1 View  11/19/2012  *RADIOLOGY REPORT*  Clinical Data: Chest pain  PORTABLE CHEST - 1 VIEW  Comparison: Chest radiograph 11/19/2012 at 10:55 hours  Findings: Heart and mediastinal contours appear stable, given differences in portable technique.  Pulmonary vascularity is within normal limits.  The lungs are clear.  Negative for pneumothorax or pleural effusion.  Surgical clips noted in the thyroid bed bilaterally.  No acute osseous abnormality.  Degenerative changes of the thoracic spine.  IMPRESSION: No acute cardiopulmonary disease.   Original Report Authenticated By: Britta Mccreedy, M.D.     ASSESSMENT AND PLAN:  1. Atypical chest pain. 2. Palpitations, this was the main presenting symptoms. He has had multiple evaluations in the office with palpitations. He has  had history of atrial fibrillation and has had cardioversion about 6-8 months ago. On presentation his EKG demonstrated normal sinus rhythm with occasional PVC. 3. Hypertension. Patient states that he's symptoms all started after he was started on Maxzide. Just added to her 3 times and every time he is taking this he has had similar symptoms.  Recommendation: I does the patient to discontinue Maxzide. He will start hydralazine 10 mg by mouth twice a day which he states was working well. He attributes his symptoms to Maxzide.  I see him back in the office in 2 weeks. He can be discharged with no other changes in her medications. He will call me if problems. He has had a routine treadmill stress test < 6 months ago and non ischemia.    [name], MD 11/19/2012, 1:20 PM [facility] Pager: [phone] Office: [phone] If no answer Cell [phone]

## 2012-11-19 NOTE — ED Notes (Signed)
Pt c/o funny feeling on left side of his chest onset this morning. Pt started new medication (maxzide) and went for a walk when symptoms began. Pt talking in complete sentences.

## 2013-06-03 ENCOUNTER — Other Ambulatory Visit: Payer: Self-pay | Admitting: Internal Medicine

## 2013-06-03 ENCOUNTER — Ambulatory Visit
Admission: RE | Admit: 2013-06-03 | Discharge: 2013-06-03 | Disposition: A | Discharge status: Home / Self Care | Payer: Medicare Other | Source: Ambulatory Visit | Attending: Internal Medicine | Admitting: Internal Medicine

## 2013-06-03 DIAGNOSIS — R05 Cough: Secondary | ICD-10-CM

## 2013-06-03 DIAGNOSIS — R0789 Other chest pain: Secondary | ICD-10-CM

## 2013-06-06 ENCOUNTER — Other Ambulatory Visit: Payer: Self-pay | Admitting: Gastroenterology

## 2013-06-06 NOTE — Telephone Encounter (Signed)
Please make an office visit for further refills  

## 2013-07-28 ENCOUNTER — Telehealth: Payer: Self-pay | Admitting: Gastroenterology

## 2013-07-28 NOTE — Telephone Encounter (Signed)
Increase miralax and benefiber bid

## 2013-07-28 NOTE — Telephone Encounter (Signed)
Pt with hx of Diverticulosis and Chronic Constipation; last OV 02/15/12 and last COLON 01/08/12. Pt reports he's taking Miralax QHS and Metamucil or Benefiber in am. He reports he doesn't feel like he is emptying. He had received Linzess , but he stated it made him bleed. I do not see where he had tried Amitiza and he doesn't think so. Please advise. Thanks.

## 2013-07-28 NOTE — Telephone Encounter (Signed)
Informed pt to increase Miralax and Benefiber, both to BID.  Pt stated understanding.

## 2013-09-04 ENCOUNTER — Emergency Department (HOSPITAL_COMMUNITY)
Admission: EM | Admit: 2013-09-04 | Discharge: 2013-09-04 | Disposition: A | Discharge status: Home / Self Care | Payer: Medicare Other | Attending: Emergency Medicine | Admitting: Emergency Medicine

## 2013-09-04 ENCOUNTER — Encounter (HOSPITAL_COMMUNITY): Payer: Self-pay | Admitting: Emergency Medicine

## 2013-09-04 ENCOUNTER — Emergency Department (HOSPITAL_COMMUNITY): Payer: Medicare Other

## 2013-09-04 DIAGNOSIS — E039 Hypothyroidism, unspecified: Secondary | ICD-10-CM | POA: Insufficient documentation

## 2013-09-04 DIAGNOSIS — F3289 Other specified depressive episodes: Secondary | ICD-10-CM | POA: Insufficient documentation

## 2013-09-04 DIAGNOSIS — Z7901 Long term (current) use of anticoagulants: Secondary | ICD-10-CM | POA: Insufficient documentation

## 2013-09-04 DIAGNOSIS — K589 Irritable bowel syndrome without diarrhea: Secondary | ICD-10-CM | POA: Insufficient documentation

## 2013-09-04 DIAGNOSIS — Z79899 Other long term (current) drug therapy: Secondary | ICD-10-CM | POA: Insufficient documentation

## 2013-09-04 DIAGNOSIS — Z85850 Personal history of malignant neoplasm of thyroid: Secondary | ICD-10-CM | POA: Insufficient documentation

## 2013-09-04 DIAGNOSIS — I10 Essential (primary) hypertension: Secondary | ICD-10-CM | POA: Insufficient documentation

## 2013-09-04 DIAGNOSIS — F329 Major depressive disorder, single episode, unspecified: Secondary | ICD-10-CM | POA: Insufficient documentation

## 2013-09-04 LAB — BASIC METABOLIC PANEL
CO2: 24 mEq/L (ref 19–32)
Calcium: 9.2 mg/dL (ref 8.4–10.5)
Creatinine, Ser: 0.72 mg/dL (ref 0.50–1.35)
Glucose, Bld: 79 mg/dL (ref 70–99)

## 2013-09-04 LAB — CBC WITH DIFFERENTIAL/PLATELET
Eosinophils Relative: 0 % (ref 0–5)
HCT: 44 % (ref 39.0–52.0)
Lymphocytes Relative: 33 % (ref 12–46)
Lymphs Abs: 1.2 10*3/uL (ref 0.7–4.0)
MCV: 92.2 fL (ref 78.0–100.0)
Monocytes Absolute: 0.2 10*3/uL (ref 0.1–1.0)
RBC: 4.77 MIL/uL (ref 4.22–5.81)
WBC: 3.7 10*3/uL — ABNORMAL LOW (ref 4.0–10.5)

## 2013-09-04 MED ORDER — HYDRALAZINE HCL 20 MG/ML IJ SOLN
5.0000 mg | Freq: Once | INTRAMUSCULAR | Status: AC
  Administered 2013-09-04: 5 mg via INTRAVENOUS
  Filled 2013-09-04: qty 1

## 2013-09-04 NOTE — ED Notes (Signed)
Had a pain in chest about an hour ago went to  Drug store and bp was high

## 2013-09-04 NOTE — ED Notes (Signed)
Pt transported to Xray. 

## 2013-09-04 NOTE — ED Provider Notes (Signed)
CSN: [id number]     Arrival date & time 09/04/13  1309 History   First MD Initiated Contact with Patient 09/04/13 1311     Chief Complaint  Patient presents with  . Chest Pain   (Consider location/radiation/quality/duration/timing/severity/associated sxs/prior Treatment) HPI Comments: Patient presents to the ER for evaluation of chest pain. Patient had onset of pain in his chest approximately one to 2 hours before coming to the ER. This was nonexertional. There was mild, cannot describe. Patient reports that he took his diastolic medication as prescribed and the pain has gone away. He was not short of breath. There is no heart palpitations, dizziness or passing out. He was concerned and went to the drugstore, checked his blood pressure. He reports that his blood pressure was high, 175/80.  Patient is a 79 y.o. male presenting with chest pain.  Chest Pain   Past Medical History  Diagnosis Date  . Hypothyroidism   . Unspecified essential hypertension   . Stricture and stenosis of esophagus   . Esophageal reflux   . Hiatal hernia   . Thyroid cancer   . Diverticulosis of colon (without mention of hemorrhage)   . Irritable bowel syndrome   . Hypertension   . Depression   . Lactose intolerance    Past Surgical History  Procedure Laterality Date  . Thyroidectomy  1990    Dr Orson Slick  . Hernia repair  1992  . Colonoscopy    . Cardioversion  08/08/2012    Procedure: CARDIOVERSION;  Surgeon: [name], MD;  Location: [facility] OR;  Service: Cardiovascular;  Laterality: N/A;   No family history on file. History  Substance Use Topics  . Smoking status: Never Smoker   . Smokeless tobacco: Never Used  . Alcohol Use: No    Review of Systems  Cardiovascular: Positive for chest pain.  All other systems reviewed and are negative.    Allergies  Codeine phosphate; Prednisone; and Sulfamethoxazole  Home Medications   Current Outpatient Rx  Name  Route  Sig  Dispense  Refill  .  amLODipine (NORVASC) 5 MG tablet   Oral   Take 5 mg by mouth daily.         . dabigatran (PRADAXA) 150 MG CAPS   Oral   Take 1 capsule (150 mg total) by mouth every 12 (twelve) hours.   60 capsule   0   . hydrALAZINE (APRESOLINE) 10 MG tablet   Oral   Take 10 mg by mouth 2 (two) times daily.          Marland Kitchen levothyroxine (SYNTHROID, LEVOTHROID) 100 MCG tablet   Oral   Take 100 mcg by mouth daily.         . nebivolol (BYSTOLIC) 2.5 MG tablet   Oral   Take 2.5 mg by mouth daily.         . polyethylene glycol powder (GLYCOLAX/MIRALAX) powder      TAKE BY MOUTH AT BEDTIME.   255 g   1   . psyllium (METAMUCIL) 58.6 % packet   Oral   Take 1 packet by mouth daily.         . Rivaroxaban (XARELTO) 20 MG TABS   Oral   Take 20 mg by mouth daily.          There were no vitals taken for this visit. Physical Exam  Constitutional: He is oriented to person, place, and time. He appears well-developed and well-nourished. No distress.  HENT:  Head: Normocephalic and  atraumatic.  Right Ear: Hearing normal.  Left Ear: Hearing normal.  Nose: Nose normal.  Mouth/Throat: Oropharynx is clear and moist and mucous membranes are normal.  Eyes: Conjunctivae and EOM are normal. Pupils are equal, round, and reactive to light.  Neck: Normal range of motion. Neck supple.  Cardiovascular: Regular rhythm, S1 normal and S2 normal.  Exam reveals no gallop and no friction rub.   No murmur heard. Pulmonary/Chest: Effort normal and breath sounds normal. No respiratory distress. He exhibits no tenderness.  Abdominal: Soft. Normal appearance and bowel sounds are normal. There is no hepatosplenomegaly. There is no tenderness. There is no rebound, no guarding, no tenderness at McBurney's point and negative Murphy's sign. No hernia.  Musculoskeletal: Normal range of motion.  Neurological: He is alert and oriented to person, place, and time. He has normal strength. No cranial nerve deficit or sensory  deficit. Coordination normal. GCS eye subscore is 4. GCS verbal subscore is 5. GCS motor subscore is 6.  Skin: Skin is warm, dry and intact. No rash noted. No cyanosis.  Psychiatric: He has a normal mood and affect. His speech is normal and behavior is normal. Thought content normal.    ED Course  Procedures (including critical care time) Labs Review Labs Reviewed  CBC WITH DIFFERENTIAL - Abnormal; Notable for the following:    WBC 3.7 (*)    Platelets 139 (*)    All other components within normal limits  BASIC METABOLIC PANEL - Abnormal; Notable for the following:    GFR calc non Af Amer 88 (*)    All other components within normal limits  TROPONIN I   Imaging Review Dg Chest 2 View  09/04/2013   CLINICAL DATA:  Chest pain and hypertension.  EXAM: CHEST  2 VIEW  COMPARISON:  06/03/2013  FINDINGS: Surgical clips in the region of the thyroid. Moderate thoracic spondylosis. Midline trachea. Normal heart size and mediastinal contours. No pleural effusion or pneumothorax. Clear lungs.  IMPRESSION: No acute cardiopulmonary disease.   Electronically Signed   By: Jeronimo Greaves M.D.   On: 09/04/2013 14:23    EKG Interpretation    Date/Time:  Thursday September 04 2013 13:14:55 EST Ventricular Rate:  53 PR Interval:  65 QRS Duration: 102 QT Interval:  451 QTC Calculation: 423 R Axis:   -7 Text Interpretation:  Sinus rhythm Short PR interval Left atrial enlargement No significant change since last tracing Confirmed by Adrienne Delay  MD, Marijah Larranaga ([id number]) on 09/04/2013 1:34:36 PM            MDM  Diagnosis: 1. Hypertension 2. Atypical chest pain  Patient presents to the for evaluation of elevated blood pressure and chest pain. Chest pain was brief and resolved prior to arrival. He did have elevated blood pressure on arrival. Additional hydralazine administered and blood pressure is improved. The patient's workup was negative. EKG unchanged, troponin normal. Case discussed briefly with his  cardiologist, Doctor Jacinto Halim. Reviewing the records, patient has had similar presentations in the past and was felt to be atypical. Doctor Jacinto Halim she agrees, this could be discharged with followup in the office. No medication changes recommended.    Gilda Crease, MD 09/04/13 [phone]

## 2013-10-28 ENCOUNTER — Ambulatory Visit (INDEPENDENT_AMBULATORY_CARE_PROVIDER_SITE_OTHER): Payer: Medicare Other | Admitting: Gastroenterology

## 2013-10-28 ENCOUNTER — Encounter: Payer: Self-pay | Admitting: Gastroenterology

## 2013-10-28 VITALS — BP 140/72 | HR 52 | Ht 70.0 in | Wt 189.5 lb

## 2013-10-28 DIAGNOSIS — K5730 Diverticulosis of large intestine without perforation or abscess without bleeding: Secondary | ICD-10-CM

## 2013-10-28 DIAGNOSIS — K5900 Constipation, unspecified: Secondary | ICD-10-CM

## 2013-10-28 MED ORDER — LACTULOSE 10 G PO PACK: 10.0000 g | PACK | Freq: Every day | ORAL | Status: DC

## 2013-10-28 NOTE — Patient Instructions (Addendum)
We have given you samples of the following medication to take: Lactulose, please take one packet and dissolve in 4 ounces of water, then drink it at bedtime  If this works well for you please call for a prescription   Please purchase Benefiber over the counter and take twice daily, can sprinkle in food.

## 2013-10-28 NOTE — Progress Notes (Signed)
This is a 80 year old African American male who has had chronic functional constipation for many years.  He currently is doing well with when necessary MiraLax and psyllium.  He denies rectal bleeding, melena, upper GI or hepatobiliary complaints.  His appetite is good and his weight is stable.  He has a history of multiple drug allergies.  He is up-to-date on his colonoscopy exams.  Current Medications, Allergies, Past Medical History, Past Surgical History, Family History and Social History were reviewed in [employer] record.  ROS: All systems were reviewed and are negative unless otherwise stated in the HPI.          Physical Exam: Blood pressure 140/72, pulse 52 and regular and weight 189.  His abdomen is not distended and there is no organomegaly, masses or tenderness.  Bowel sounds are entirely normal.  Mental status is normal    Assessment and Plan: Chronic functional constipation in a patient who is not very compliant with his medications.  Have asked him to take Benefiber 1 heaping tablespoon twice a day with his food, liberal by mouth fluids, and to try lactulose 10 g at bedtime if needed for severe constipation.  His last colonoscopy was in April of 2013 showed severe left colon diverticulosis.  Tried to reiterate importance of bulk and fiber in his diet, and his continue his other medications as listed per primary care including his daily thyroid medication.

## 2014-01-13 ENCOUNTER — Other Ambulatory Visit: Payer: Self-pay | Admitting: Gastroenterology

## 2014-01-13 NOTE — Telephone Encounter (Signed)
Patient is requesting refill of Miralax Follow up is not until 10-2014 Is it ok to refill?

## 2014-01-14 MED ORDER — POLYETHYLENE GLYCOL 3350 17 G PO PACK: 17.0000 g | PACK | Freq: Every day | ORAL | Status: DC | PRN

## 2014-01-14 NOTE — Telephone Encounter (Signed)
Sure-Ok to refill x one year

## 2014-02-11 IMAGING — CR DG CHEST 2V
2 series · 2 of 2 positions shown · non-contrast
Comparison: 06/03/2013

CLINICAL DATA: Chest pain and hypertension.

EXAM:
CHEST  2 VIEW

[w chest pa]
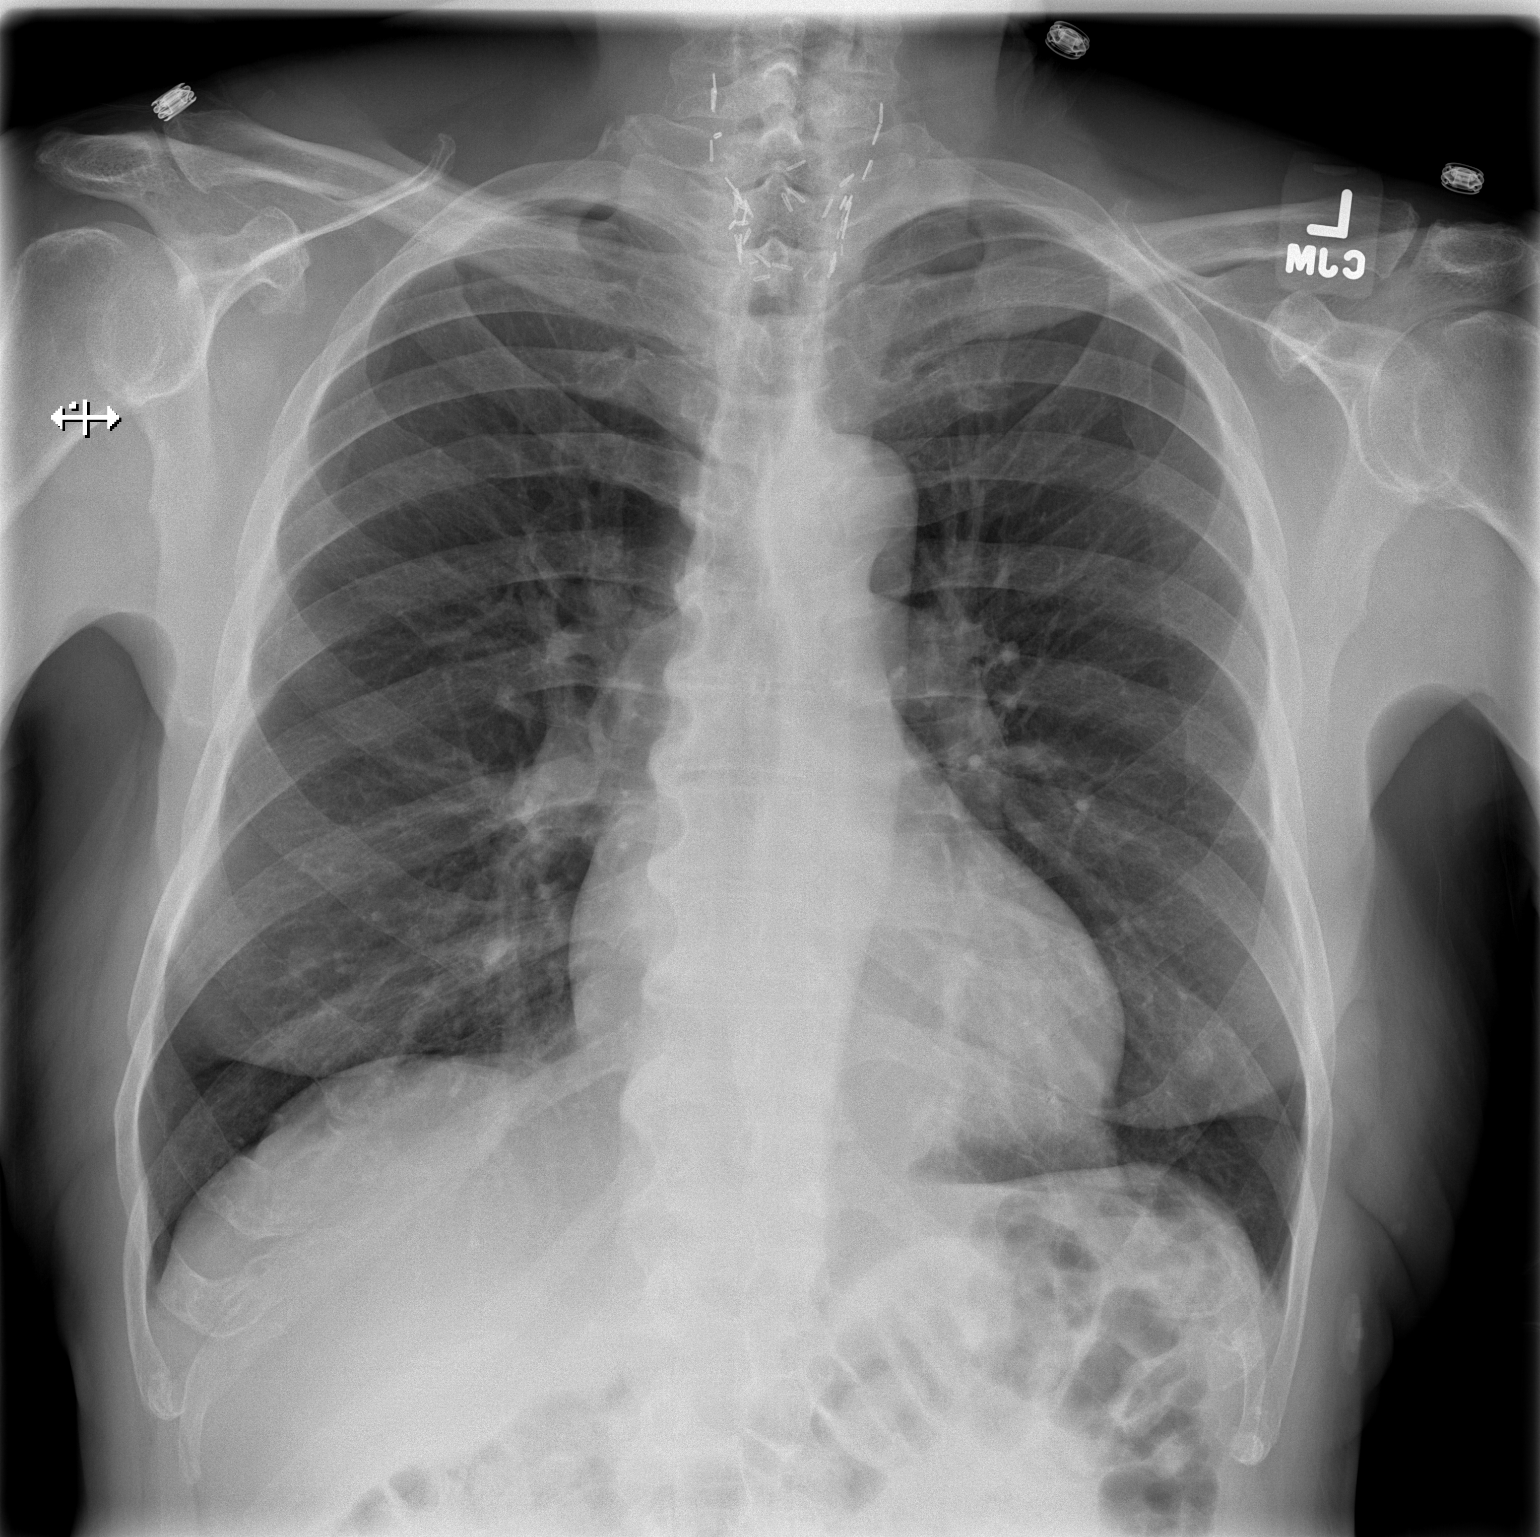

[w chest lat]
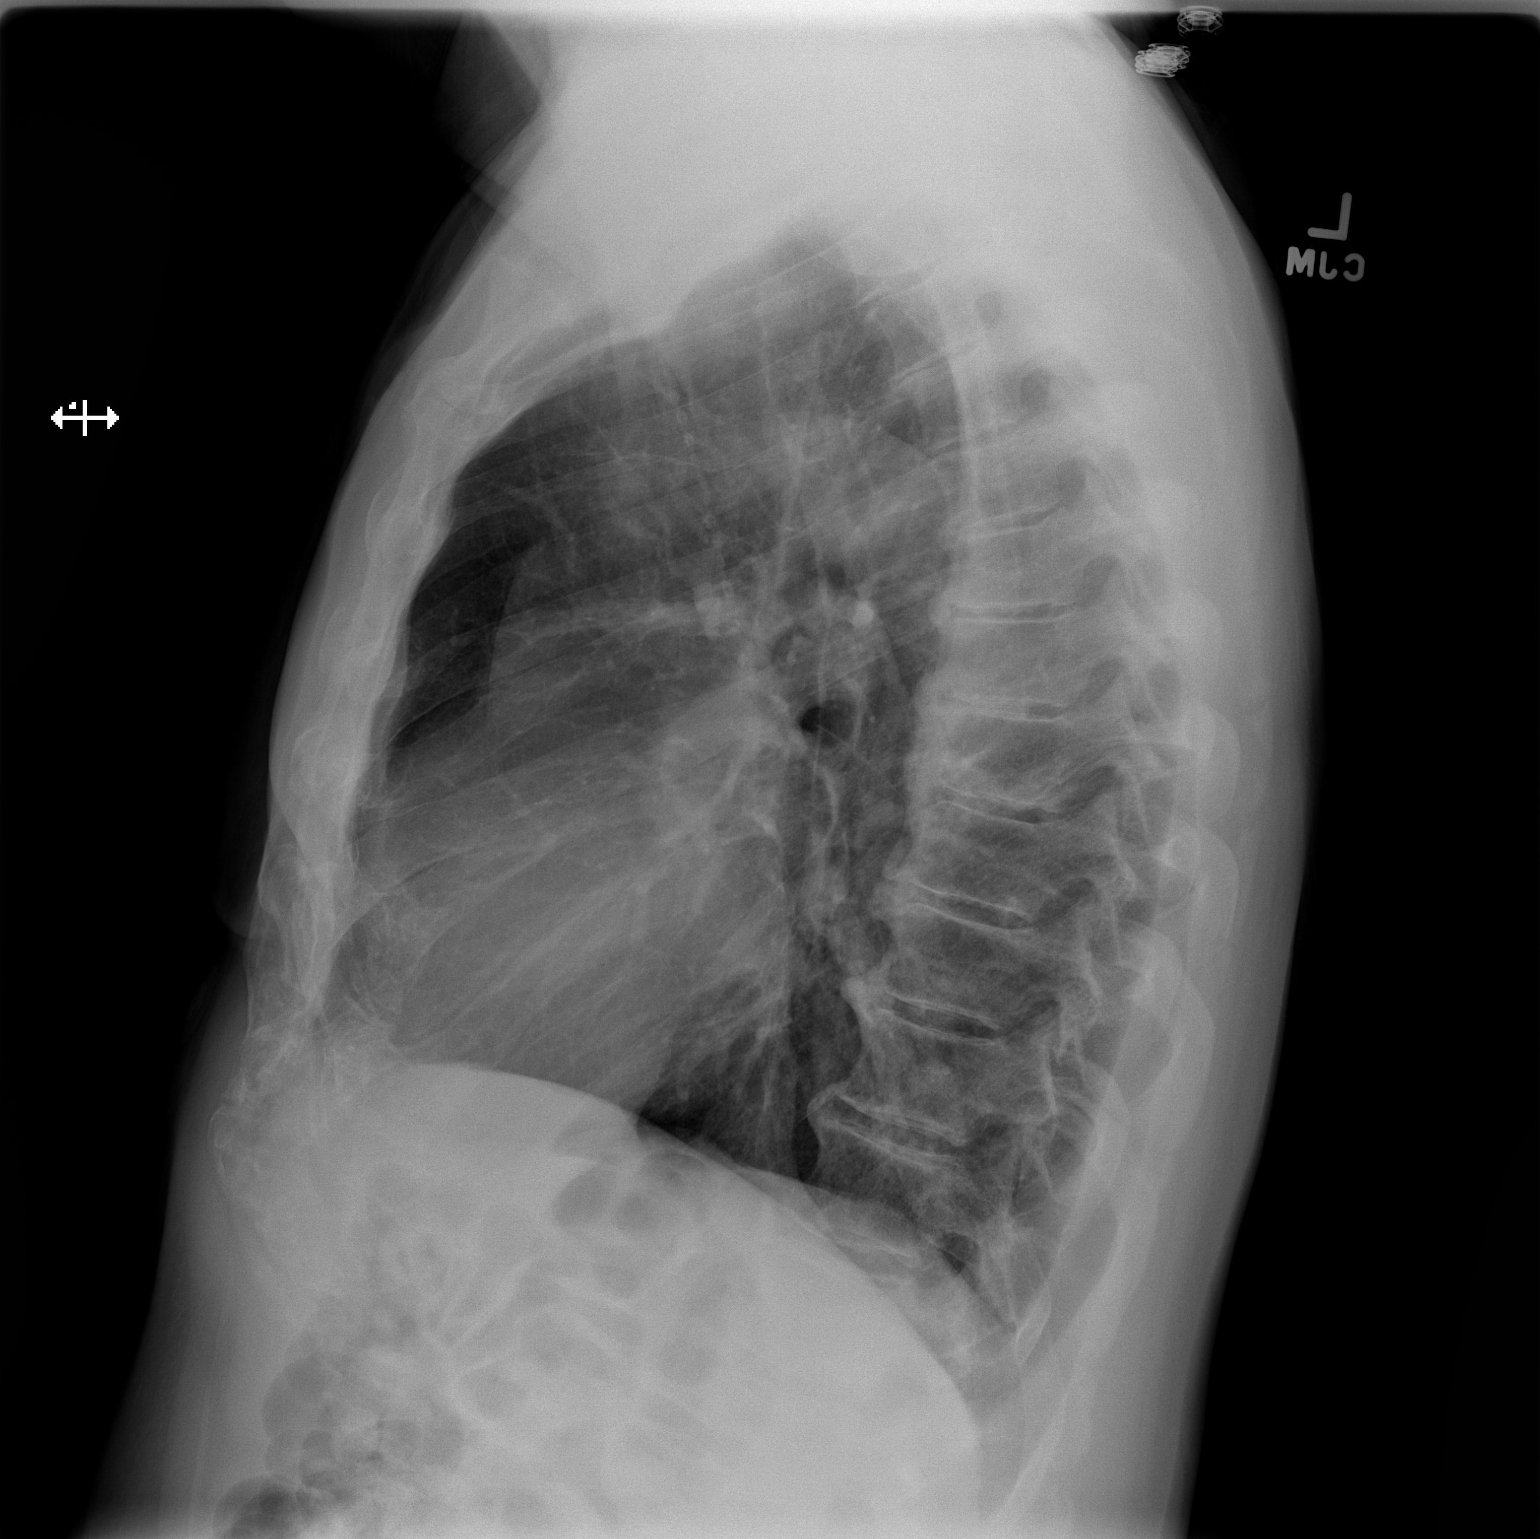

[2 of 2 positions shown; findings below may reference images not displayed]

FINDINGS: Surgical clips in the region of the thyroid. Moderate thoracic
spondylosis. Midline trachea. Normal heart size and mediastinal
contours. No pleural effusion or pneumothorax. Clear lungs.
IMPRESSION: No acute cardiopulmonary disease.

## 2014-02-17 ENCOUNTER — Other Ambulatory Visit: Payer: Self-pay | Admitting: Orthopedic Surgery

## 2014-02-17 DIAGNOSIS — M25561 Pain in right knee: Secondary | ICD-10-CM

## 2014-02-21 ENCOUNTER — Ambulatory Visit
Admission: RE | Admit: 2014-02-21 | Discharge: 2014-02-21 | Disposition: A | Discharge status: Home / Self Care | Payer: Medicare Other | Source: Ambulatory Visit | Attending: Orthopedic Surgery | Admitting: Orthopedic Surgery

## 2014-02-21 DIAGNOSIS — M25561 Pain in right knee: Secondary | ICD-10-CM

## 2016-04-05 ENCOUNTER — Other Ambulatory Visit: Payer: Self-pay

## 2016-04-05 DIAGNOSIS — I872 Venous insufficiency (chronic) (peripheral): Secondary | ICD-10-CM

## 2016-05-05 ENCOUNTER — Emergency Department (HOSPITAL_COMMUNITY)
Admission: EM | Admit: 2016-05-05 | Discharge: 2016-05-05 | Disposition: A | Discharge status: Home / Self Care | Payer: Medicare HMO | Attending: Emergency Medicine | Admitting: Emergency Medicine

## 2016-05-05 DIAGNOSIS — Z79899 Other long term (current) drug therapy: Secondary | ICD-10-CM | POA: Diagnosis not present

## 2016-05-05 DIAGNOSIS — D72819 Decreased white blood cell count, unspecified: Secondary | ICD-10-CM | POA: Insufficient documentation

## 2016-05-05 DIAGNOSIS — I10 Essential (primary) hypertension: Secondary | ICD-10-CM | POA: Insufficient documentation

## 2016-05-05 DIAGNOSIS — E039 Hypothyroidism, unspecified: Secondary | ICD-10-CM | POA: Diagnosis not present

## 2016-05-05 DIAGNOSIS — Z7982 Long term (current) use of aspirin: Secondary | ICD-10-CM | POA: Diagnosis not present

## 2016-05-05 DIAGNOSIS — Z85850 Personal history of malignant neoplasm of thyroid: Secondary | ICD-10-CM | POA: Diagnosis not present

## 2016-05-05 DIAGNOSIS — M21331 Wrist drop, right wrist: Secondary | ICD-10-CM | POA: Diagnosis not present

## 2016-05-05 DIAGNOSIS — G5631 Lesion of radial nerve, right upper limb: Secondary | ICD-10-CM | POA: Diagnosis not present

## 2016-05-05 LAB — CBC WITH DIFFERENTIAL/PLATELET
BASOS PCT: 1 %
Basophils Absolute: 0 10*3/uL (ref 0.0–0.1)
Eosinophils Absolute: 0.1 10*3/uL (ref 0.0–0.7)
Eosinophils Relative: 2 %
HEMATOCRIT: 41.1 % (ref 39.0–52.0)
HEMOGLOBIN: 13.6 g/dL (ref 13.0–17.0)
Lymphocytes Relative: 49 %
Lymphs Abs: 1.8 10*3/uL (ref 0.7–4.0)
MCH: 30.8 pg (ref 26.0–34.0)
MCHC: 33.1 g/dL (ref 30.0–36.0)
MCV: 93.2 fL (ref 78.0–100.0)
MONOS PCT: 9 %
Monocytes Absolute: 0.3 10*3/uL (ref 0.1–1.0)
NEUTROS ABS: 1.5 10*3/uL — AB (ref 1.7–7.7)
Neutrophils Relative %: 41 %
Platelets: 180 10*3/uL (ref 150–400)
RBC: 4.41 MIL/uL (ref 4.22–5.81)
RDW: 13.1 % (ref 11.5–15.5)
WBC: 3.7 10*3/uL — ABNORMAL LOW (ref 4.0–10.5)

## 2016-05-05 LAB — COMPREHENSIVE METABOLIC PANEL
ALBUMIN: 4 g/dL (ref 3.5–5.0)
ALK PHOS: 58 U/L (ref 38–126)
ALT: 21 U/L (ref 17–63)
ANION GAP: 9 (ref 5–15)
AST: 23 U/L (ref 15–41)
BILIRUBIN TOTAL: 0.6 mg/dL (ref 0.3–1.2)
BUN: 10 mg/dL (ref 6–20)
CALCIUM: 9.2 mg/dL (ref 8.9–10.3)
CO2: 26 mmol/L (ref 22–32)
CREATININE: 0.74 mg/dL (ref 0.61–1.24)
Chloride: 105 mmol/L (ref 101–111)
GFR calc Af Amer: >60 mL/min (ref >60)
GFR calc non Af Amer: >60 mL/min (ref >60)
GLUCOSE: 99 mg/dL (ref 65–99)
Potassium: 4.3 mmol/L (ref 3.5–5.1)
Sodium: 140 mmol/L (ref 135–145)
Total Protein: 6.3 g/dL — ABNORMAL LOW (ref 6.5–8.1)

## 2016-05-05 LAB — SEDIMENTATION RATE: Sed Rate: 9 mm/hr (ref 0–16)

## 2016-05-05 NOTE — ED Triage Notes (Signed)
Pt reports that when he woke up this morning his wrist "could not work".  No other stroke symptoms, no weakness, droop, slurred speech.  Hx of HNT and Afib

## 2016-05-05 NOTE — ED Provider Notes (Signed)
[facility] DEPT Provider Note   CSN: [id number] Arrival date & time: 05/05/16  [id number]  First Provider Contact:  First MD Initiated Contact with Patient 05/05/16 [phone]        History   Chief Complaint No chief complaint on file.   HPI Ian Weber is a 82 y.o. male.  The history is provided by the patient.  He woke up to go to the bathroom and noticed that he was having difficulty using his right hand. Everything was finally went to bed at 11 PM. He denies any pain and he denies any numbness. His no difficulty speaking and no difficulty using his right leg. He denies any recent trauma.  Past Medical History:  Diagnosis Date  . Depression   . Diverticulosis of colon (without mention of hemorrhage)   . Esophageal reflux   . Hiatal hernia   . Hypertension   . Hypothyroidism   . Irritable bowel syndrome   . Lactose intolerance   . Stricture and stenosis of esophagus   . Thyroid cancer   . Unspecified essential hypertension     Patient Active Problem List   Diagnosis Date Noted  . Special screening for malignant neoplasms, colon 01/08/2012  . Unspecified constipation 01/08/2012  . Constipation 01/02/2012  . BRBPR (bright red blood per rectum) 01/02/2012  . Constipation, acute 01/02/2012  . VITAMIN D DEFICIENCY 10/22/2009  . DIVERTICULOSIS-COLON 10/22/2009  . ABDOMINAL PAIN, EPIGASTRIC 10/22/2009  . THYROID CANCER, HX OF 10/22/2009  . HYPOTHYROIDISM, POSTSURGICAL 10/20/2009  . HYPERTENSION 10/20/2009  . ESOPHAGEAL STRICTURE 10/20/2009  . GERD 10/20/2009  . HIATAL HERNIA 10/20/2009    Past Surgical History:  Procedure Laterality Date  . CARDIOVERSION  08/08/2012   Procedure: CARDIOVERSION;  Surgeon: Laverda Page, MD;  Location: [facility];  Service: Cardiovascular;  Laterality: N/A;  . COLONOSCOPY    . HERNIA REPAIR  1992  . THYROIDECTOMY  1990   Dr Deon Pilling       Home Medications    Prior to Admission medications   Medication Sig Start Date End Date  Taking? Authorizing Provider  ALPRAZolam Duanne Moron) 0.5 MG tablet Take 0.5 mg by mouth at bedtime as needed for anxiety.    Historical Provider, MD  aspirin 81 MG tablet Take 81 mg by mouth daily.    Historical Provider, MD  hydrALAZINE (APRESOLINE) 10 MG tablet Take 10 mg by mouth 2 (two) times daily.     Historical Provider, MD  lactulose (CEPHULAC) 10 G packet Take 1 packet (10 g total) by mouth at bedtime. 10/28/13   Sable Feil, MD  levothyroxine (SYNTHROID, LEVOTHROID) 100 MCG tablet Take 100 mcg by mouth daily.    Historical Provider, MD  nebivolol (BYSTOLIC) 2.5 MG tablet Take 2.5 mg by mouth daily.    Historical Provider, MD  polyethylene glycol (MIRALAX / GLYCOLAX) packet Take 17 g by mouth daily as needed for mild constipation. 01/14/14   Amy S Esterwood, PA-C    Family History No family history on file.  Social History Social History  Substance Use Topics  . Smoking status: Never Smoker  . Smokeless tobacco: Never Used  . Alcohol use No     Allergies   Amlodipine; Chlorthalidone; Codeine phosphate; Hydrochlorothiazide w-triamterene; Prednisone; Sulfamethoxazole; and Triamterene   Review of Systems Review of Systems  All other systems reviewed and are negative.    Physical Exam Updated Vital Signs BP 148/77 (BP Location: Right Arm)   Pulse (!) 55   Temp 97.6 F (36.4  C) (Oral)   Resp 13   Ht 5\' 11"  (1.803 m)   Wt 180 lb (81.6 kg)   SpO2 100%   BMI 25.10 kg/m   Physical Exam  Nursing note and vitals reviewed.   82 year old male, resting comfortably and in no acute distress. Vital signs are significant for mild hypertension and mild bradycardia. Oxygen saturation is 100%, which is normal. Head is normocephalic and atraumatic. PERRLA, EOMI. Oropharynx is clear. Neck is nontender and supple without adenopathy or JVD. There are no carotid bruits. Back is nontender and there is no CVA tenderness. Lungs are clear without rales, wheezes, or rhonchi. Chest is  nontender. Heart has regular rate and rhythm without murmur. Abdomen is soft, flat, nontender without masses or hepatosplenomegaly and peristalsis is normoactive. Extremities have no cyanosis or edema, full range of motion is present. Skin is warm and dry without rash. Neurologic: Mental status is normal, cranial nerves are intact. There is no pronator drift. There is weakness of extension of the right wrist and the fingers of the right hand. There is normal strength of flexion. There are no sensory deficits.  ED Treatments / Results  Labs (all labs ordered are listed, but only abnormal results are displayed) Labs Reviewed  COMPREHENSIVE METABOLIC PANEL - Abnormal; Notable for the following:       Result Value   Total Protein 6.3 (*)    All other components within normal limits  CBC WITH DIFFERENTIAL/PLATELET - Abnormal; Notable for the following:    WBC 3.7 (*)    Neutro Abs 1.5 (*)    All other components within normal limits  SEDIMENTATION RATE     Procedures Procedures (including critical care time)  Medications Ordered in ED Medications - No data to display   Initial Impression / Assessment and Plan / ED Course  I have reviewed the triage vital signs and the nursing notes.  Pertinent labs & imaging results that were available during my care of the patient were reviewed by me and considered in my medical decision making (see chart for details).  Clinical Course  Comment By Time  Mild leukopenia Delora Fuel, MD [id number]    Wristdrop which is consistent with her radial nerve palsy. No evidence of radiculopathy or stroke. Old records are reviewed and he has no relevant past visits. Screening labs are obtained and he will be placed in a wrist splint and referred to neurology for outpatient workup.  Labs are significant only for mild leukopenia which has been present previously and is not felt to be related to his wristdrop.  Final Clinical Impressions(s) / ED Diagnoses     Final diagnoses:  Wrist drop, right  Radial nerve palsy, right  Leukopenia    New Prescriptions New Prescriptions   No medications on file     Delora Fuel, MD [id number]

## 2016-05-08 ENCOUNTER — Ambulatory Visit (INDEPENDENT_AMBULATORY_CARE_PROVIDER_SITE_OTHER): Payer: Medicare HMO | Admitting: Neurology

## 2016-05-08 ENCOUNTER — Encounter: Payer: Self-pay | Admitting: Neurology

## 2016-05-08 VITALS — BP 141/72 | HR 50 | Ht 71.0 in | Wt 185.2 lb

## 2016-05-08 DIAGNOSIS — G5631 Lesion of radial nerve, right upper limb: Secondary | ICD-10-CM

## 2016-05-08 NOTE — Patient Instructions (Signed)
Remember to drink plenty of fluid, eat healthy meals and do not skip any meals. Try to eat protein with a every meal and eat a healthy snack such as fruit or nuts in between meals. Try to keep a regular sleep-wake schedule and try to exercise daily, particularly in the form of walking, 20-30 minutes a day, if you can.   As far as your medications are concerned, I would like to suggest  As far as diagnostic testing: EMG/NCS, physical therapy  I would like to see you back for emg/ncs or after physical therapy, sooner if we need to. Please call us with any interim questions, concerns, problems, updates or refill requests.   Our phone number is [phone]. We also have an after hours call service for urgent matters and there is a physician on-call for urgent questions. For any emergencies you know to call 911 or go to the nearest emergency room

## 2016-05-08 NOTE — Progress Notes (Signed)
[facility]    Provider:  Dr Jaynee Eagles Referring Provider: Jilda Panda, MD Primary Care Physician:  Jilda Panda, MD  CC:  Right arm weakness  HPI:  Ian Weber is a 82 y.o. male here as a referral from Dr. Mellody Drown for right hand problems.   PMHx HTN, anxiety, thyroid cancer s/p thyroidectomy. He had acute onset right wrist drop no known inciting events except he was sleeping on his arm like he always does. At 4am he woke up to go to the bathroom and had acute onset right arm weakness, wrist drop. He denies any previous weakness but just some cramps in the hands. Weakness is getting better. No pain except in a localized spot in the forearm (points to the [facility] area). He was sleeping on his shoulder when he woke up, no alcohol or drug use, no other recent inciting events. He was using a brace on his right hand to help with the wrist drop but he felt that made it worse. Nothing has made it better really just has gotten better with time and use No neck pain. No other associated symptoms, no new vision changes, no headache, slurred speech. No sensory changes. No other focal neurologic deficits. The onset was acute and the improvement is slowly progressive. No FHx of neuromuscular disorder or neuropathy.   Reviewed notes, labs and imaging from outside physicians, which showed:  Sed rate nml 9, CBC and CMP unremarkable except mild leukopenia drawn 05/05/2016  Patient was seen in the emergency room 2 days ago. Patient reported that he woke up to go to the bathroom and noticed he was developing difficulty using his right hand. Everything was fine when going to bed at 11 PM. He denied any pain or any numbness. No difficulty speaking and no difficulty using his right leg. No recent trauma. No alcohol or drug use. Exam was unremarkable except for right wrist drop and finger extension weakness. Otherwise strength was normal, there were no other focal neurologic deficits, no sensory deficits.  Patient was diagnosed with a radial nerve palsy without evidence of radiculopathy or stroke. No relevant other past medical history.   Review of Systems: Patient complains of symptoms per HPI as well as the following symptoms: double vision, feleing cold, swelling in legs, ringing in ears, constipation, cramps, urination problems, memory loss, numbness, anxiety . Pertinent negatives per HPI. All others negative.   Social History   Social History  . Marital status: Married    Spouse name: Cally   . Number of children: 8  . Years of education: 8   Occupational History  . Retired    Social History Main Topics  . Smoking status: Never Smoker  . Smokeless tobacco: Never Used  . Alcohol use No  . Drug use: No  . Sexual activity: Yes   Other Topics Concern  . Not on file   Social History Narrative   Lives with wife   Caffeine use:  none    History reviewed. No pertinent family history.  Past Medical History:  Diagnosis Date  . Depression   . Diverticulosis of colon (without mention of hemorrhage)   . Esophageal reflux   . Hiatal hernia   . Hypertension   . Hypothyroidism   . Irritable bowel syndrome   . Lactose intolerance   . Stricture and stenosis of esophagus   . Thyroid cancer ([place])   . Unspecified essential hypertension     Past Surgical History:  Procedure Laterality Date  . CARDIOVERSION  08/08/2012   Procedure: CARDIOVERSION;  Surgeon: Laverda Page, MD;  Location: [facility];  Service: Cardiovascular;  Laterality: N/A;  . COLONOSCOPY    . HERNIA REPAIR  1992  . THYROIDECTOMY  1990   Dr Deon Pilling    Current Outpatient Prescriptions  Medication Sig Dispense Refill  . aspirin 81 MG tablet Take 81 mg by mouth daily.    . hydrALAZINE (APRESOLINE) 10 MG tablet Take 25 mg by mouth 2 (two) times daily.     Marland Kitchen levothyroxine (SYNTHROID, LEVOTHROID) 100 MCG tablet Take 100 mcg by mouth daily.    . nebivolol (BYSTOLIC) 2.5 MG tablet Take 2.5 mg by mouth daily.    .  polyethylene glycol (MIRALAX / GLYCOLAX) packet Take 17 g by mouth daily as needed for mild constipation. 30 each 4   No current facility-administered medications for this visit.     Allergies as of 05/08/2016 - Review Complete 05/08/2016  Allergen Reaction Noted  . Amlodipine  09/04/2013  . Chlorthalidone  09/04/2013  . Codeine phosphate  08/13/2006  . Hydrochlorothiazide w-triamterene  09/04/2013  . Prednisone  06/16/2010  . Sulfamethoxazole  08/13/2006  . Triamterene  09/04/2013    Vitals: BP (!) 141/72 (BP Location: Right Arm, Patient Position: Sitting, Cuff Size: Normal)   Pulse (!) 50   Ht 5\' 11"  (1.803 m)   Wt 185 lb 3.2 oz (84 kg)   BMI 25.83 kg/m  Last Weight:  Wt Readings from Last 1 Encounters:  05/08/16 185 lb 3.2 oz (84 kg)   Last Height:   Ht Readings from Last 1 Encounters:  05/08/16 5\' 11"  (1.803 m)   Physical exam: Exam: Gen: NAD, conversant, well nourised, well groomed                     CV: RRR, no MRG. No Carotid Bruits. +peripheral edema, warm, nontender Eyes: Conjunctivae clear without exudates or hemorrhage  Neuro: Detailed Neurologic Exam  Speech:    Speech is normal; fluent and spontaneous with normal comprehension.  Cognition:    The patient is oriented to person, place, and time;     recent and remote memory intact;     language fluent;     normal attention, concentration,     fund of knowledge Cranial Nerves:    The pupils are equal, round, and reactive to light. The fundi are normal and spontaneous venous pulsations are present. Visual fields are full to finger confrontation. Extraocular movements are intact. Trigeminal sensation is intact and the muscles of mastication are normal. Some mild assymmetry of the right NL folds but within normal limits do not believe this is right lower weakness. The palate elevates in the midline. Hearing intact. Voice is normal. Shoulder shrug is normal. The tongue has normal motion without fasciculations.     Coordination:    Normal finger to nose and heel to shin.   Gait:    Heel-toe and tandem gait are intact. Mild imbalance with tandem.   Motor Observation:    No asymmetry, no atrophy, and no involuntary movements noted. Tone:    Normal muscle tone.    Posture:    Posture is normal. normal erect    Strength: right weakness of triceps, wrist and fingers extensors without weakness of distal median or ulnar muscles. Otherwise strength is intact in the upper and lower limbs.      Sensation: very mild decrease right radial nerve distribution in the hand.      Reflex Exam:  DTR's: right biceps brisker than the left. Absent AJs. Otherwise deep tendon reflexes in the upper and lower extremities are normal and symmetric bilaterally.   Toes:    The toes are downgoing bilaterally.   Clonus:    Clonus is absent.       Assessment/Plan:  82 year old mle with acute onset radial palsy likely compression or stretch injury. Improving.  - physical therapy - emg/ncs  - follow up in the office  Sarina Ill, MD  [facility] [address] [place], [place] [address]  Phone [phone] Fax [phone]

## 2016-05-09 DIAGNOSIS — G5630 Lesion of radial nerve, unspecified upper limb: Secondary | ICD-10-CM | POA: Insufficient documentation

## 2016-05-30 ENCOUNTER — Encounter: Payer: Self-pay | Admitting: Gastroenterology

## 2016-05-30 ENCOUNTER — Ambulatory Visit (INDEPENDENT_AMBULATORY_CARE_PROVIDER_SITE_OTHER): Payer: Medicare HMO | Admitting: Gastroenterology

## 2016-05-30 VITALS — BP 128/70 | HR 60 | Ht 70.0 in | Wt 186.2 lb

## 2016-05-30 DIAGNOSIS — K5901 Slow transit constipation: Secondary | ICD-10-CM

## 2016-05-30 DIAGNOSIS — K5730 Diverticulosis of large intestine without perforation or abscess without bleeding: Secondary | ICD-10-CM | POA: Diagnosis not present

## 2016-05-30 MED ORDER — POLYETHYLENE GLYCOL 3350 17 G PO PACK: 17.0000 g | PACK | Freq: Two times a day (BID) | ORAL | 4 refills | Status: DC

## 2016-05-30 NOTE — Progress Notes (Signed)
[facility] Progress Note   Chief Complaint: constipation  Subjective  History:  Last saw Ian Weber 1/15 for functional constipation.  Fiber/miralax prescribed. The patient is somewhat limited historian. It seems he has taken either fiber or MiraLAX or minimally depending upon the severity of his constipation. He denies abdominal pain or rectal bleeding, his appetite is good and his weight reportedly stable. Ian Weber's last colonoscopy was in 2013, at which time diverticulosis was found. [place] reports that he had no improvement after a one-month trial of Linzess samples from his PCP. ROS: Cardiovascular:  no chest pain Respiratory: no dyspnea  The patient's Past Medical, Family and Social History were reviewed and are on file in the EMR.  Objective:  Med list reviewed  Vital signs in last 24 hrs: Vitals:   05/30/16 1612  BP: 128/70  Pulse: 60    Physical Exam   HEENT: sclera anicteric, oral mucosa moist without lesions  Neck: supple, no thyromegaly, JVD or lymphadenopathy  Cardiac: RRR without murmurs, S1S2 heard, no peripheral edema  Pulm: clear to auscultation bilaterally, normal RR and effort noted  Abdomen: soft, No tenderness, with active bowel sounds. No guarding or palpable hepatosplenomegaly.  Skin; warm and dry, no jaundice or rash  Recent Labs:  CBC    Component Value Date/Time   WBC 3.7 (L) 05/05/2016 0550   RBC 4.41 05/05/2016 0550   HGB 13.6 05/05/2016 0550   HCT 41.1 05/05/2016 0550   PLT 180 05/05/2016 0550   MCV 93.2 05/05/2016 0550   MCH 30.8 05/05/2016 0550   MCHC 33.1 05/05/2016 0550   RDW 13.1 05/05/2016 0550   LYMPHSABS 1.8 05/05/2016 0550   MONOABS 0.3 05/05/2016 0550   EOSABS 0.1 05/05/2016 0550   BASOSABS 0.0 05/05/2016 0550   CMP Latest Ref Rng & Units 05/05/2016 09/04/2013 11/19/2012  Glucose 65 - 99 mg/dL 99 79 88  BUN 6 - 20 mg/dL 10 15 12   Creatinine 0.61 - 1.24 mg/dL 0.74 0.72 0.73  Sodium 135 - 145 mmol/L 140 138 138  Potassium  3.5 - 5.1 mmol/L 4.3 4.2 4.6  Chloride 101 - 111 mmol/L 105 104 102  CO2 22 - 32 mmol/L 26 24 25   Calcium 8.9 - 10.3 mg/dL 9.2 9.2 9.7  Total Protein 6.5 - 8.1 g/dL 6.3(L) - 7.6  Total Bilirubin 0.3 - 1.2 mg/dL 0.6 - 0.4  Alkaline Phos 38 - 126 U/L 58 - 59  AST 15 - 41 U/L 23 - 27  ALT 17 - 63 U/L 21 - 22     @ASSESSMENTPLANBEGIN @ Assessment: Encounter Diagnoses  Name Primary?  . Slow transit constipation Yes  . Diverticulosis of large intestine without hemorrhage    This problem seems stable for many years, and there are no red flag symptoms. He reports getting plenty of fluids and he walks at least 4 miles a day.  Plan:  MiraLAX twice daily for chronic constipation. No further testing planned at this point. See me as needed  Total time 15 minutes, over half spent in counseling and coordination of care.   [name]

## 2016-05-30 NOTE — Patient Instructions (Signed)
We have sent the following medications to your pharmacy for you to pick up at your convenience: Miralax  If you are age 80 or older, your body mass index should be between 23-30. Your Body mass index is 26.72 kg/m. If this is out of the aforementioned range listed, please consider follow up with your Primary Care Provider.  If you are age 96 or younger, your body mass index should be between 19-25. Your Body mass index is 26.72 kg/m. If this is out of the aformentioned range listed, please consider follow up with your Primary Care Provider.   Thank you for choosing [facility]  Dr Wilfrid Lund III

## 2016-06-15 ENCOUNTER — Encounter: Payer: Self-pay | Admitting: Vascular Surgery

## 2016-06-21 ENCOUNTER — Ambulatory Visit (HOSPITAL_COMMUNITY)
Admission: RE | Admit: 2016-06-21 | Discharge: 2016-06-21 | Disposition: A | Discharge status: Home / Self Care | Payer: Medicare HMO | Source: Ambulatory Visit | Attending: Vascular Surgery | Admitting: Vascular Surgery

## 2016-06-21 ENCOUNTER — Encounter: Payer: Self-pay | Admitting: Vascular Surgery

## 2016-06-21 ENCOUNTER — Ambulatory Visit (INDEPENDENT_AMBULATORY_CARE_PROVIDER_SITE_OTHER): Payer: Medicare HMO | Admitting: Vascular Surgery

## 2016-06-21 ENCOUNTER — Encounter (HOSPITAL_COMMUNITY): Payer: Self-pay

## 2016-06-21 VITALS — BP 136/78 | HR 56 | Temp 97.5°F | Resp 16 | Ht 70.0 in | Wt 187.0 lb

## 2016-06-21 DIAGNOSIS — I872 Venous insufficiency (chronic) (peripheral): Secondary | ICD-10-CM

## 2016-06-21 NOTE — Progress Notes (Signed)
Patient name: Ian Weber MRN: [id number] DOB: 1934/06/08 Sex: male  REASON FOR CONSULT: Chronic venous insufficiency. Referred by Dr. Jilda Panda.   HPI: Ian Weber is a 82 y.o. male, Who is referred with a long history of bilateral lower extremity swelling. He tells me that he has had swelling in both legs for 17 years. This has always been more significant on the right side. He denies any sudden increase in the swelling. He has no history of DVT. He denies any pain associated with standing or sitting. He is very active and walks 4-5 miles a day. He denies claudication or rest pain. He denies any history of nonhealing wounds.  I have reviewed the records that were sent with the patient. He does have hypertension which is been under good control. He status post thyroidectomy for carcinoma of the thyroid in 1989. He has a long history of chronic venous insufficiency. He also has sinus bradycardia with occasional PACs.  Past Medical History:  Diagnosis Date  . Depression   . Diverticulosis of colon (without mention of hemorrhage)   . Esophageal reflux   . Hiatal hernia   . Hypertension   . Hypothyroidism   . Irritable bowel syndrome   . Lactose intolerance   . Stricture and stenosis of esophagus   . Thyroid cancer ([place])   . Unspecified essential hypertension     No family history on file.  SOCIAL HISTORY: Social History   Social History  . Marital status: Married    Spouse name: Cally   . Number of children: 8  . Years of education: 8   Occupational History  . Retired    Social History Main Topics  . Smoking status: Never Smoker  . Smokeless tobacco: Never Used  . Alcohol use No  . Drug use: No  . Sexual activity: Yes   Other Topics Concern  . Not on file   Social History Narrative   Lives with wife   Caffeine use:  none    Allergies  Allergen Reactions  . Amlodipine   . Chlorthalidone   . Codeine Phosphate     REACTION: weakness, sweating  .  Hydrochlorothiazide W-Triamterene   . Prednisone     REACTION: unspecified  . Sulfamethoxazole     REACTION: itching  . Triamterene     Current Outpatient Prescriptions  Medication Sig Dispense Refill  . aspirin 81 MG tablet Take 81 mg by mouth daily.    . hydrALAZINE (APRESOLINE) 25 MG tablet Take 1 tablet by mouth 2 (two) times daily.    Marland Kitchen levothyroxine (SYNTHROID, LEVOTHROID) 100 MCG tablet Take 100 mcg by mouth daily.    . nebivolol (BYSTOLIC) 2.5 MG tablet Take 2.5 mg by mouth daily.    . polyethylene glycol (MIRALAX / GLYCOLAX) packet Take 17 g by mouth 2 (two) times daily. 60 each 4  . ofloxacin (OCUFLOX) 0.3 % ophthalmic solution Instill 1 drop BID in operative eye starting 2 days prior to surgery and continue 2 weeks after surgery.     No current facility-administered medications for this visit.     REVIEW OF SYSTEMS:  [X]  denotes positive finding, [ ]  denotes negative finding Cardiac  Comments:  Chest pain or chest pressure:    Shortness of breath upon exertion:    Short of breath when lying flat:    Irregular heart rhythm:        Vascular    Pain in calf, thigh, or hip brought on  by ambulation:    Pain in feet at night that wakes you up from your sleep:     Blood clot in your veins:    Leg swelling:  X       Pulmonary    Oxygen at home:    Productive cough:     Wheezing:         Neurologic    Sudden weakness in arms or legs:     Sudden numbness in arms or legs:     Sudden onset of difficulty speaking or slurred speech:    Temporary loss of vision in one eye:     Problems with dizziness:         Gastrointestinal    Blood in stool:     Vomited blood:         Genitourinary    Burning when urinating:     Blood in urine:        Psychiatric    Major depression:         Hematologic    Bleeding problems:    Problems with blood clotting too easily:        Skin    Rashes or ulcers:        Constitutional    Fever or chills:      PHYSICAL  EXAM: Vitals:   06/21/16 0958  BP: 136/78  Pulse: (!) 56  Resp: 16  Temp: 97.5 F (36.4 C)  TempSrc: Oral  SpO2: 99%  Weight: 187 lb (84.8 kg)  Height: 5\' 10"  (1.778 m)    GENERAL: The patient is a well-nourished male, in no acute distress. The vital signs are documented above. CARDIAC: There is a regular rate and rhythm.  VASCULAR:  I do not take carotid bruits He has palpable femoral pulses and dorsalis pedis pulses bilaterally. He has bilateral lower extremity swelling. He has 2+ pitting edema. The swelling is slightly more significant on the right side. PULMONARY: There is good air exchange bilaterally without wheezing or rales. ABDOMEN: Soft and non-tender with normal pitched bowel sounds.  MUSCULOSKELETAL: There are no major deformities or cyanosis. NEUROLOGIC: No focal weakness or paresthesias are detected. SKIN: There are no ulcers or rashes noted. PSYCHIATRIC: The patient has a normal affect.  DATA:   He did not have a formal venous study today.  MEDICAL ISSUES:  CHRONIC VENOUS INSUFFICIENCY: Based on his exam, this patient has significant chronic venous insufficiency. I've explained that this is a chronic problem. We have discussed the importance of intermittent leg elevation and the proper positioning for this. In addition, I have discussed the importance of wearing compression stockings when they will be standing or sitting for a long time.  I have encouraged him to stay as active as possible and to avoid prolonged sitting and standing. We have also discussed water aerobics which I think is also very helpful for people with chronic venous insufficiency. I will be happy to see him back at any time if any new issues arise. I reassured him that he has excellent arterial flow.   Deitra Mayo [facility] [phone]

## 2016-07-03 ENCOUNTER — Ambulatory Visit: Payer: Medicare HMO | Admitting: Neurology

## 2017-05-14 ENCOUNTER — Ambulatory Visit (INDEPENDENT_AMBULATORY_CARE_PROVIDER_SITE_OTHER): Payer: Medicare HMO | Admitting: Gastroenterology

## 2017-05-14 ENCOUNTER — Encounter (INDEPENDENT_AMBULATORY_CARE_PROVIDER_SITE_OTHER): Payer: Self-pay

## 2017-05-14 ENCOUNTER — Encounter: Payer: Self-pay | Admitting: Gastroenterology

## 2017-05-14 VITALS — BP 122/80 | HR 60 | Ht 70.0 in | Wt 186.2 lb

## 2017-05-14 DIAGNOSIS — K5730 Diverticulosis of large intestine without perforation or abscess without bleeding: Secondary | ICD-10-CM

## 2017-05-14 DIAGNOSIS — K4090 Unilateral inguinal hernia, without obstruction or gangrene, not specified as recurrent: Secondary | ICD-10-CM | POA: Diagnosis not present

## 2017-05-14 DIAGNOSIS — K5901 Slow transit constipation: Secondary | ICD-10-CM | POA: Diagnosis not present

## 2017-05-14 NOTE — Progress Notes (Signed)
     [facility] Progress Note  Chief Complaint: Constipation  Subjective  History:  Ian Weber was sent for reevaluation by primary care for chronic constipation. He last saw me a year ago, see that note for details. He complains of ongoing constipation despite Linzess (which he felt was no help at all), twice daily MiraLAX. He apparently started a OTC suppository but 2 weeks ago which has helped him have a bowel movement daily. He was uncertain whether he should continue this. He denies rectal bleeding, his appetite as been good and his weight stable. He also complains of a bulge in his right groin  ROS: Cardiovascular:  no chest pain Respiratory: no dyspnea  The patient's Past Medical, Family and Social History were reviewed and are on file in the EMR.  Objective:  Med list reviewed  Vital signs in last 24 hrs: Vitals:   05/14/17 1400  BP: 122/80  Pulse: 60    Physical Exam    HEENT: sclera anicteric, oral mucosa moist without lesions  Neck: supple, no thyromegaly, JVD or lymphadenopathy  Cardiac: RRR without murmurs, S1S2 heard, no peripheral edema  Pulm: clear to auscultation bilaterally, normal RR and effort noted  Abdomen: soft, No tenderness, with active bowel sounds. No guarding or palpable hepatosplenomegaly. Skin; warm and dry, no jaundice or rash he has a right inguinal hernia best appreciated while standing   Recent Labs:  No recent data for review  @ASSESSMENTPLANBEGIN @ Assessment: Encounter Diagnoses  Name Primary?  . Slow transit constipation Yes  . Diverticulosis of colon without hemorrhage   . Right inguinal hernia    It sounds like Linzess did not help, so I agree he should not go back to it. He is on twice daily MiraLAX, and I would continue the suppositories that has been helping. I recommended a twice a week he take 2 tablespoons of milk of magnesia in the morning and again in the evening to try to get a more complete bowel evacuation. I  suspect that he has poor motility in severe diverticulosis is the cause for this long-standing constipation.  Review of primary care labs indicate normal TSH/T4 and calcium earlier this year. See me as needed  Total time 15 minutes, over half spent in counseling and coordination of care.   [name]   CC: Jilda Panda, MD

## 2017-05-14 NOTE — Patient Instructions (Addendum)
If you are age 81 or older, your body mass index should be between 23-30. Your Body mass index is 26.72 kg/m. If this is out of the aforementioned range listed, please consider follow up with your Primary Care Provider.  If you are age 51 or younger, your body mass index should be between 19-25. Your Body mass index is 26.72 kg/m. If this is out of the aformentioned range listed, please consider follow up with your Primary Care Provider.   We have referred you to [facility] to evaluate your right groin hernia. If you do not hear from them in a week please call the office [phone]  For constipation:  Continue miralax and the suppository as you are now.  Once a week, take 2 tablespoons of milk of magnesia in the morning and another 2 tablespoons that evening.  This should help get a better bowel evacuation.

## 2017-05-15 ENCOUNTER — Telehealth: Payer: Self-pay

## 2017-05-15 NOTE — Telephone Encounter (Signed)
Records faxed to [facility] for NP appt for R inguinal hernia. Will await appointment details.

## 2017-05-22 NOTE — Telephone Encounter (Signed)
Pt has appt with Dr Redmond Pulling at [facility] on 06-06-2017 @ 1130am

## 2017-06-21 ENCOUNTER — Telehealth: Payer: Self-pay | Admitting: Gastroenterology

## 2017-06-21 NOTE — Telephone Encounter (Signed)
Pt states he has been taking Miralax and suppositories and is still having issues with constipation. Pt has not taken Milk of Magnesia 2 TBSP BID twice a week as instructed. Discussed with him that he needs to take the MOM and call us back if he is still having problems.

## 2017-07-18 ENCOUNTER — Telehealth: Payer: Self-pay

## 2017-07-18 ENCOUNTER — Telehealth: Payer: Self-pay | Admitting: Gastroenterology

## 2017-07-18 NOTE — Telephone Encounter (Signed)
Called patient back, no answer and unable to leave a voice message. Will try him tomorrow.

## 2017-07-18 NOTE — Telephone Encounter (Signed)
Patient is having difficulty remembering when to take his M.O.M. along with his other medications. He states that there is no one there to help set up a schedule. I told him I would make a calendar up with the scheduling and mail it to him. He did say that the only way to have a bowel movement is when he uses suppositories. He states that is is taking Miralax, but it does not work for him.

## 2017-07-18 NOTE — Telephone Encounter (Signed)
Thanks for the update.  Yes, he and I have been over this a couple of times.  It is very kind of you to make a calendar for him.  And perhaps then he could obtain a calendar and write out a similar schedule himself.  Using the suppository that he described every couple of days and he milk of magnesia is fine, and I think that is the best we will be able to do.

## 2017-07-18 NOTE — Telephone Encounter (Signed)
Patient called back, see other phone note.

## 2017-08-20 ENCOUNTER — Telehealth: Payer: Self-pay | Admitting: Gastroenterology

## 2017-08-20 NOTE — Telephone Encounter (Signed)
Patient advised of these recommendations.

## 2017-08-20 NOTE — Telephone Encounter (Signed)
Patient has been taking Miralax one capful BID, milk of magnesium 2 times a week and suppositories 2 times a week. Patient is still only having small amounts of stool. He is concerned about the continued use of these medications.

## 2017-08-20 NOTE — Telephone Encounter (Signed)
We discussed how his diverticulosis is causing him to have a narrowed and slow colon. As such, he will have to continue a regular bowel regimen.  It is safe to take the treatment he has described.  I think he should a half bottle of OTC magnesium citrate to get a decent stool evacuation.  Stay near the bathroom after taking it.  Then increase the miralax to two capfuls twice daily along with the other things.

## 2017-09-24 ENCOUNTER — Encounter (HOSPITAL_COMMUNITY): Payer: Self-pay

## 2017-09-24 ENCOUNTER — Other Ambulatory Visit: Payer: Self-pay

## 2017-09-24 ENCOUNTER — Inpatient Hospital Stay (HOSPITAL_COMMUNITY)
Admission: EM | Admit: 2017-09-24 | Discharge: 2017-09-27 | DRG: 378 | Disposition: A | Discharge status: Home / Self Care | Payer: Medicare HMO | Attending: Internal Medicine | Admitting: Internal Medicine

## 2017-09-24 DIAGNOSIS — D62 Acute posthemorrhagic anemia: Secondary | ICD-10-CM | POA: Diagnosis present

## 2017-09-24 DIAGNOSIS — Z7982 Long term (current) use of aspirin: Secondary | ICD-10-CM | POA: Diagnosis not present

## 2017-09-24 DIAGNOSIS — K625 Hemorrhage of anus and rectum: Secondary | ICD-10-CM | POA: Diagnosis not present

## 2017-09-24 DIAGNOSIS — Z888 Allergy status to other drugs, medicaments and biological substances status: Secondary | ICD-10-CM | POA: Diagnosis not present

## 2017-09-24 DIAGNOSIS — Z79899 Other long term (current) drug therapy: Secondary | ICD-10-CM

## 2017-09-24 DIAGNOSIS — K5909 Other constipation: Secondary | ICD-10-CM | POA: Diagnosis present

## 2017-09-24 DIAGNOSIS — K219 Gastro-esophageal reflux disease without esophagitis: Secondary | ICD-10-CM | POA: Diagnosis present

## 2017-09-24 DIAGNOSIS — E890 Postprocedural hypothyroidism: Secondary | ICD-10-CM | POA: Diagnosis present

## 2017-09-24 DIAGNOSIS — K5731 Diverticulosis of large intestine without perforation or abscess with bleeding: Secondary | ICD-10-CM | POA: Diagnosis present

## 2017-09-24 DIAGNOSIS — E739 Lactose intolerance, unspecified: Secondary | ICD-10-CM | POA: Diagnosis present

## 2017-09-24 DIAGNOSIS — E860 Dehydration: Secondary | ICD-10-CM | POA: Diagnosis not present

## 2017-09-24 DIAGNOSIS — K589 Irritable bowel syndrome without diarrhea: Secondary | ICD-10-CM | POA: Diagnosis present

## 2017-09-24 DIAGNOSIS — Z885 Allergy status to narcotic agent status: Secondary | ICD-10-CM

## 2017-09-24 DIAGNOSIS — K922 Gastrointestinal hemorrhage, unspecified: Secondary | ICD-10-CM | POA: Diagnosis present

## 2017-09-24 DIAGNOSIS — D649 Anemia, unspecified: Secondary | ICD-10-CM

## 2017-09-24 DIAGNOSIS — K4090 Unilateral inguinal hernia, without obstruction or gangrene, not specified as recurrent: Secondary | ICD-10-CM | POA: Diagnosis present

## 2017-09-24 DIAGNOSIS — Z85850 Personal history of malignant neoplasm of thyroid: Secondary | ICD-10-CM | POA: Diagnosis not present

## 2017-09-24 DIAGNOSIS — I480 Paroxysmal atrial fibrillation: Secondary | ICD-10-CM | POA: Diagnosis present

## 2017-09-24 DIAGNOSIS — I10 Essential (primary) hypertension: Secondary | ICD-10-CM | POA: Diagnosis present

## 2017-09-24 DIAGNOSIS — F329 Major depressive disorder, single episode, unspecified: Secondary | ICD-10-CM | POA: Diagnosis present

## 2017-09-24 DIAGNOSIS — F419 Anxiety disorder, unspecified: Secondary | ICD-10-CM | POA: Diagnosis present

## 2017-09-24 DIAGNOSIS — Z882 Allergy status to sulfonamides status: Secondary | ICD-10-CM

## 2017-09-24 DIAGNOSIS — R600 Localized edema: Secondary | ICD-10-CM | POA: Diagnosis present

## 2017-09-24 LAB — COMPREHENSIVE METABOLIC PANEL
ALBUMIN: 3.5 g/dL (ref 3.5–5.0)
ALK PHOS: 55 U/L (ref 38–126)
ALT: 14 U/L — AB (ref 17–63)
AST: 18 U/L (ref 15–41)
Anion gap: 5 (ref 5–15)
BILIRUBIN TOTAL: 0.7 mg/dL (ref 0.3–1.2)
BUN: 16 mg/dL (ref 6–20)
CALCIUM: 8.6 mg/dL — AB (ref 8.9–10.3)
CO2: 26 mmol/L (ref 22–32)
CREATININE: 0.74 mg/dL (ref 0.61–1.24)
Chloride: 110 mmol/L (ref 101–111)
GFR calc Af Amer: >60 mL/min (ref >60)
GFR calc non Af Amer: >60 mL/min (ref >60)
GLUCOSE: 125 mg/dL — AB (ref 65–99)
Potassium: 3.9 mmol/L (ref 3.5–5.1)
SODIUM: 141 mmol/L (ref 135–145)
TOTAL PROTEIN: 6.1 g/dL — AB (ref 6.5–8.1)

## 2017-09-24 LAB — CBC
HCT: 31 % — ABNORMAL LOW (ref 39.0–52.0)
HCT: 35.4 % — ABNORMAL LOW (ref 39.0–52.0)
HEMATOCRIT: 34 % — AB (ref 39.0–52.0)
HEMOGLOBIN: 11.2 g/dL — AB (ref 13.0–17.0)
HEMOGLOBIN: 10.1 g/dL — AB (ref 13.0–17.0)
Hemoglobin: 11.8 g/dL — ABNORMAL LOW (ref 13.0–17.0)
MCH: 31.1 pg (ref 26.0–34.0)
MCH: 30.9 pg (ref 26.0–34.0)
MCH: 30.4 pg (ref 26.0–34.0)
MCHC: 32.6 g/dL (ref 30.0–36.0)
MCHC: 32.9 g/dL (ref 30.0–36.0)
MCHC: 33.3 g/dL (ref 30.0–36.0)
MCV: 93.2 fL (ref 78.0–100.0)
MCV: 93.4 fL (ref 78.0–100.0)
MCV: 93.7 fL (ref 78.0–100.0)
PLATELETS: 173 10*3/uL (ref 150–400)
PLATELETS: 147 10*3/uL — AB (ref 150–400)
Platelets: 157 10*3/uL (ref 150–400)
RBC: 3.8 MIL/uL — ABNORMAL LOW (ref 4.22–5.81)
RBC: 3.63 MIL/uL — AB (ref 4.22–5.81)
RBC: 3.32 MIL/uL — AB (ref 4.22–5.81)
RDW: 12.9 % (ref 11.5–15.5)
RDW: 12.9 % (ref 11.5–15.5)
RDW: 13 % (ref 11.5–15.5)
WBC: 4.4 10*3/uL (ref 4.0–10.5)
WBC: 6 10*3/uL (ref 4.0–10.5)
WBC: 7 10*3/uL (ref 4.0–10.5)

## 2017-09-24 LAB — CBG MONITORING, ED: GLUCOSE-CAPILLARY: 94 mg/dL (ref 65–99)

## 2017-09-24 LAB — PROTIME-INR
INR: 1.14
Prothrombin Time: 14.5 seconds (ref 11.4–15.2)

## 2017-09-24 LAB — APTT: APTT: 32 s (ref 24–36)

## 2017-09-24 LAB — ABO/RH: ABO/RH(D): A POS

## 2017-09-24 LAB — TSH: TSH: 0.905 u[IU]/mL (ref 0.350–4.500)

## 2017-09-24 MED ORDER — SODIUM CHLORIDE 0.9 % IV BOLUS (SEPSIS)
500.0000 mL | Freq: Once | INTRAVENOUS | Status: AC
Start: 2017-09-24 — End: 2017-09-24
  Administered 2017-09-24: 500 mL via INTRAVENOUS

## 2017-09-24 MED ORDER — LOSARTAN POTASSIUM 50 MG PO TABS: 50.0000 mg | ORAL_TABLET | Freq: Every day | ORAL | Status: DC

## 2017-09-24 MED ORDER — ACETAMINOPHEN 650 MG RE SUPP: 650.0000 mg | Freq: Four times a day (QID) | RECTAL | Status: DC | PRN

## 2017-09-24 MED ORDER — SODIUM CHLORIDE 0.9 % IV SOLN
INTRAVENOUS | Status: DC
  Administered 2017-09-24 – 2017-09-25 (×3): via INTRAVENOUS

## 2017-09-24 MED ORDER — ZOLPIDEM TARTRATE 5 MG PO TABS: 5.0000 mg | ORAL_TABLET | Freq: Every evening | ORAL | Status: DC | PRN

## 2017-09-24 MED ORDER — PEG-KCL-NACL-NASULF-NA ASC-C 100 G PO SOLR: 0.5000 | Freq: Once | ORAL | Status: DC

## 2017-09-24 MED ORDER — ALPRAZOLAM 0.25 MG PO TABS: 0.2500 mg | ORAL_TABLET | Freq: Every evening | ORAL | Status: DC | PRN

## 2017-09-24 MED ORDER — ACETAMINOPHEN 325 MG PO TABS: 650.0000 mg | ORAL_TABLET | Freq: Four times a day (QID) | ORAL | Status: DC | PRN

## 2017-09-24 MED ORDER — LEVOTHYROXINE SODIUM 100 MCG PO TABS
100.0000 ug | ORAL_TABLET | Freq: Every day | ORAL | Status: DC
  Administered 2017-09-26 – 2017-09-27 (×2): 100 ug via ORAL
  Filled 2017-09-24 (×2): qty 1

## 2017-09-24 MED ORDER — PEG-KCL-NACL-NASULF-NA ASC-C 100 G PO SOLR
0.5000 | Freq: Once | ORAL | Status: AC
Start: 2017-09-25 — End: 2017-09-25
  Administered 2017-09-25: 100 g via ORAL
  Filled 2017-09-24: qty 1

## 2017-09-24 MED ORDER — METOCLOPRAMIDE HCL 5 MG/ML IJ SOLN
5.0000 mg | Freq: Once | INTRAMUSCULAR | Status: AC
  Administered 2017-09-25: 5 mg via INTRAVENOUS
  Filled 2017-09-24: qty 2

## 2017-09-24 MED ORDER — PEG-KCL-NACL-NASULF-NA ASC-C 100 G PO SOLR
0.5000 | Freq: Once | ORAL | Status: AC
  Administered 2017-09-24: 100 g via ORAL
  Filled 2017-09-24: qty 1

## 2017-09-24 MED ORDER — NEBIVOLOL HCL 2.5 MG PO TABS
2.5000 mg | ORAL_TABLET | Freq: Every day | ORAL | Status: DC
  Administered 2017-09-26 – 2017-09-27 (×2): 2.5 mg via ORAL
  Filled 2017-09-24 (×4): qty 1

## 2017-09-24 MED ORDER — PANTOPRAZOLE SODIUM 40 MG IV SOLR
40.0000 mg | Freq: Two times a day (BID) | INTRAVENOUS | Status: DC
  Administered 2017-09-24 – 2017-09-26 (×4): 40 mg via INTRAVENOUS
  Filled 2017-09-24 (×4): qty 40

## 2017-09-24 MED ORDER — ONDANSETRON HCL 4 MG/2ML IJ SOLN: 4.0000 mg | Freq: Four times a day (QID) | INTRAMUSCULAR | Status: DC | PRN

## 2017-09-24 MED ORDER — METOCLOPRAMIDE HCL 5 MG/ML IJ SOLN
5.0000 mg | Freq: Once | INTRAMUSCULAR | Status: AC
  Administered 2017-09-24: 5 mg via INTRAVENOUS
  Filled 2017-09-24: qty 2

## 2017-09-24 MED ORDER — ONDANSETRON HCL 4 MG PO TABS: 4.0000 mg | ORAL_TABLET | Freq: Four times a day (QID) | ORAL | Status: DC | PRN

## 2017-09-24 MED ORDER — HYDRALAZINE HCL 20 MG/ML IJ SOLN: 5.0000 mg | INTRAMUSCULAR | Status: DC | PRN

## 2017-09-24 NOTE — H&P (View-Only) (Signed)
Consultation  Referring Provider: Dr. Blaine Hamper     Primary Care Physician:  Jilda Panda, MD Primary Gastroenterologist: Dr. Loletha Carrow        Reason for Consultation: Rectal Bleeding             HPI:   Ian Weber is a 83 y.o. AA male with a past medical history as listed below, including diverticulosis, hypertension, post-thyroid cancer surgical hypothyroidism, GERD, anxiety, esophageal stricture and IBS, who presented to the ER early this morning around 3 in the morning with a complaint of rectal bleeding.    Today, the patient is found with his family member by his side. He explains that early this morning around 2:30 he woke up and had a bowel movement with a large amount of bright red blood in it. There was also some "darker blood" mixed in. Patient had 2 or 3 more episodes. He then proceeded to the ER and had one further episode about 30 minutes after arriving here around 3:30 in the morning. Patient has had no further bright red blood per rectum since then. Patient denies any other symptoms including abdominal pain or rectal pain. He tells me he had been having his normal bowel movements which was "constipation" prior to this. Patient has been using MiraLAX 2 capfuls twice a day as well as milk of magnesia 2 times a week and an enema 2 times a week per recommendations from Dr. Loletha Carrow. He tells me that "my bowels are always hard to move". Patient denies any previous episodes of rectal bleeding. Patient tells me that "I feel fine" other than seeing this blood.    Patient denies fever, chills, dizziness, syncope, weight loss, anorexia, nausea, vomiting, heartburn or reflux.   Previous GI history: 05/2017-Dr. Danis office visit: Following up on chronic constipation despite Linzess and twice daily MiraLAX, at that time he had started an over-the-counter suppository which helped him have a daily bowel movement; constipation was thought due to slow transit due to severe diverticulosis of the colon,  Linzess was stopped as this was not helping and he was continued on twice daily MiraLAX as well as suppositories and twice a week dose of milk of magnesia 01/2012-colonoscopy, Dr. [name]: Impression-severe diverticulosis and otherwise normal 06/2010-EGD, Dr. [name]: Impression-esophagitis  Past Medical History:  Diagnosis Date  . Depression   . Diverticulosis of colon (without mention of hemorrhage)   . Esophageal reflux   . Hiatal hernia   . Hypertension   . Hypothyroidism   . Irritable bowel syndrome   . Lactose intolerance   . Stricture and stenosis of esophagus   . Thyroid cancer ([place])   . Unspecified essential hypertension     Past Surgical History:  Procedure Laterality Date  . CARDIOVERSION  08/08/2012   Procedure: CARDIOVERSION;  Surgeon: Laverda Page, MD;  Location: [facility];  Service: Cardiovascular;  Laterality: N/A;  . COLONOSCOPY    . HERNIA REPAIR  1992  . THYROIDECTOMY  1990   Dr Deon Pilling    Family History: No colon cancer or polyps  Social History   Tobacco Use  . Smoking status: Never Smoker  . Smokeless tobacco: Never Used  Substance Use Topics  . Alcohol use: No  . Drug use: No    Prior to Admission medications   Medication Sig Start Date End Date Taking? Authorizing Provider  ALPRAZolam Duanne Moron) 0.25 MG tablet Take 0.25 mg by mouth at bedtime as needed for anxiety or sleep.  04/14/16  Yes  [provider]  aspirin 81 MG tablet Take 81 mg by mouth daily.   Yes [provider]  levothyroxine (SYNTHROID, LEVOTHROID) 100 MCG tablet Take 100 mcg by mouth daily.   Yes [provider]  losartan (COZAAR) 50 MG tablet Take 50 mg by mouth daily.   Yes [provider]  nebivolol (BYSTOLIC) 2.5 MG tablet Take 2.5 mg by mouth daily.   Yes [provider]  polyethylene glycol (MIRALAX / GLYCOLAX) packet Take 17 g by mouth 2 (two) times daily. Patient taking differently: Take 17 g by mouth daily.  05/30/16  Yes Danis,  Kirke Corin, MD    Current Facility-Administered Medications  Medication Dose Route Frequency Provider Last Rate Last Dose  . 0.9 %  sodium chloride infusion   Intravenous Continuous Ivor Costa, MD      . acetaminophen (TYLENOL) tablet 650 mg  650 mg Oral Q6H PRN Ivor Costa, MD       Or  . acetaminophen (TYLENOL) suppository 650 mg  650 mg Rectal Q6H PRN Ivor Costa, MD      . ALPRAZolam Duanne Moron) tablet 0.25 mg  0.25 mg Oral QHS PRN Ivor Costa, MD      . hydrALAZINE (APRESOLINE) injection 5 mg  5 mg Intravenous Q2H PRN Ivor Costa, MD      . levothyroxine (SYNTHROID, LEVOTHROID) tablet 100 mcg  100 mcg Oral Daily Ivor Costa, MD      . losartan (COZAAR) tablet 50 mg  50 mg Oral Daily Ivor Costa, MD      . nebivolol (BYSTOLIC) tablet 2.5 mg  2.5 mg Oral Daily Ivor Costa, MD      . ondansetron [facility]) tablet 4 mg  4 mg Oral Q6H PRN Ivor Costa, MD       Or  . ondansetron (ZOFRAN) injection 4 mg  4 mg Intravenous Q6H PRN Ivor Costa, MD      . pantoprazole (PROTONIX) injection 40 mg  40 mg Intravenous Q12H Ivor Costa, MD      . zolpidem (AMBIEN) tablet 5 mg  5 mg Oral QHS PRN Ivor Costa, MD       Current Outpatient Medications  Medication Sig Dispense Refill  . ALPRAZolam (XANAX) 0.25 MG tablet Take 0.25 mg by mouth at bedtime as needed for anxiety or sleep.     Marland Kitchen aspirin 81 MG tablet Take 81 mg by mouth daily.    Marland Kitchen levothyroxine (SYNTHROID, LEVOTHROID) 100 MCG tablet Take 100 mcg by mouth daily.    Marland Kitchen losartan (COZAAR) 50 MG tablet Take 50 mg by mouth daily.    . nebivolol (BYSTOLIC) 2.5 MG tablet Take 2.5 mg by mouth daily.    . polyethylene glycol (MIRALAX / GLYCOLAX) packet Take 17 g by mouth 2 (two) times daily. (Patient taking differently: Take 17 g by mouth daily. ) 60 each 4    Allergies as of 09/24/2017 - Review Complete 09/24/2017  Allergen Reaction Noted  . Amlodipine  09/04/2013  . Chlorthalidone  09/04/2013  . Codeine phosphate  08/13/2006  . Hydrochlorothiazide w-triamterene   09/04/2013  . Prednisone  06/16/2010  . Sulfamethoxazole  08/13/2006  . Triamterene  09/04/2013     Review of Systems:    Constitutional: No weight loss, fever, chills, weakness or fatigue Skin: No rash  Cardiovascular: No chest pain Respiratory: No SOB  Gastrointestinal: See HPI and otherwise negative Genitourinary: No dysuria  Neurological: No headache, dizziness or syncope Musculoskeletal: No new muscle or joint pain Hematologic: No  bruising Psychiatric: No history of depression or anxiety   Physical Exam:  Vital signs in last 24 hours: Temp:  [97.5 F (36.4 C)] 97.5 F (36.4 C) (12/17 0310) Pulse Rate:  [51-70] 51 (12/17 0607) Resp:  [12-24] 12 (12/17 0607) BP: (106-120)/(63-76) 113/63 (12/17 0607) SpO2:  [99 %-100 %] 100 % (12/17 0607) Weight:  [190 lb (86.2 kg)] 190 lb (86.2 kg) (12/17 0502)   General:   Pleasant AA male appears to be in NAD, Well developed, Well nourished, alert and cooperative Head:  Normocephalic and atraumatic. Eyes:   PEERL, EOMI. No icterus. Conjunctiva pink. Ears:  Normal auditory acuity. Neck:  Supple Throat: Oral cavity and pharynx without inflammation, swelling or lesion. Teeth in good condition. Lungs: Respirations even and unlabored. Lungs clear to auscultation bilaterally.   No wheezes, crackles, or rhonchi.  Heart: Normal S1, S2. No MRG. Regular rate and rhythm. No peripheral edema, cyanosis or pallor.  Abdomen:  Soft, nondistended, nontender. No rebound or guarding. Normal bowel sounds. No appreciable masses or hepatomegaly. Rectal:  External: dried blood around rectum, no hemorrhoids or fissure, no ttp; Internal exam: no mass or fullness or ttp, discharge of brb/maroon colored blood Msk:  Symmetrical without gross deformities.  Extremities:  Without edema, no deformity or joint abnormality.  Neurologic:  Alert and  oriented x4;  grossly normal neurologically.  Skin:   Dry and intact without significant lesions or rashes. Psychiatric:  Demonstrates good judgement and reason without abnormal affect or behaviors.   LAB RESULTS: Recent Labs    09/24/17 0326  WBC 4.4  HGB 11.8*  HCT 35.4*  PLT 173   BMET Recent Labs    09/24/17 0326  NA 141  K 3.9  CL 110  CO2 26  GLUCOSE 125*  BUN 16  CREATININE 0.74  CALCIUM 8.6*   LFT Recent Labs    09/24/17 0326  PROT 6.1*  ALBUMIN 3.5  AST 18  ALT 14*  ALKPHOS 55  BILITOT 0.7   PT/INR Recent Labs    09/24/17 0326  LABPROT 14.5  INR 1.14    PREVIOUS ENDOSCOPIES:            See HPI   Impression / Plan:   Impression: 1. Rectal bleeding: So far 3 episodes, last around 3:30 this morning, rectal exam with no hemorrhoids or tenderness or mass, last colo 2013 with severe diverticulosis; Most likely diverticular bleeding 2. Chronic constipation: Only somewhat helped by 2 capfuls of MiraLAX twice a day, twice weekly milk of magnesia and twice weekly suppositories at home, no help from [facility] in the past 3. Anemia: Hemoglobin 11.8 at time of admission, previously 13.6 on 04/2016, consider relation to acute blood loss  Plan: 1. Agree with admission to observe hemoglobin with transfusion as needed less than 7 2. Continue supportive measures including IV fluids 3. Will discuss with Dr. Carlean Purl, it has been 5 years since patient's last colonoscopy, may consider prepping today versus observation for further signs of acute bleeding 4. Patient may be on a clear liquid diet 5. Agree with holding ASA 6. Please await final recommendations Dr. Carlean Purl later today  Thank you for your kind consultation, we will continue to follow.  Lavone Nian Lemmon  09/24/2017, 9:40 AM Pager #: [phone]    [facility] Attending   I have taken an interval history, reviewed the chart and examined the patient. I agree with the Advanced Practitioner's note, impression and recommendations.     Painless lower GI bleed -  seems most likely to be diverticulosis but since last  colonoscopy 5 years agio chance of neoplasia increases so have decided to perform colonoscopy tomorrow.  The risks and benefits as well as alternatives of endoscopic procedure(s) have been discussed and reviewed. All questions answered. The patient agrees to proceed.  Gatha Mayer, MD, [facility] [phone] (pager) 09/24/2017 7:08 PM

## 2017-09-24 NOTE — Progress Notes (Addendum)
Patient transported from ED. Denies any pain. States his last BM was earlier in the afternoon which had blood in it. Md made aware. Will continue to monitor.

## 2017-09-24 NOTE — ED Provider Notes (Signed)
[facility] DEPT Provider Note   CSN: [id number] Arrival date & time: 09/24/17  0254     History   Chief Complaint Chief Complaint  Patient presents with  . Rectal Bleeding    Signficant amount of blood from rectum in ED    HPI VANN OKERLUND is a 83 y.o. male.  Patient is an 83 year old male presenting with complaints of rectal bleeding.  This started several hours prior to presentation.  He has had 2 bowel movements both consisting of a large quantity of bright red blood.  He denies any abdominal or rectal pain.  He denies any lightheadedness, shortness of breath, chest pain.  He reports a colonoscopy several years ago which she tells me was unremarkable.  This is never happened before.   The history is provided by the patient.  Rectal Bleeding  Quality:  Bright red Amount:  Moderate Duration:  3 hours Timing:  Intermittent Chronicity:  New Relieved by:  Nothing Worsened by:  Nothing Ineffective treatments:  None tried Associated symptoms: no abdominal pain, no dizziness, no fever, no hematemesis and no light-headedness   Risk factors: no anticoagulant use     Past Medical History:  Diagnosis Date  . Depression   . Diverticulosis of colon (without mention of hemorrhage)   . Esophageal reflux   . Hiatal hernia   . Hypertension   . Hypothyroidism   . Irritable bowel syndrome   . Lactose intolerance   . Stricture and stenosis of esophagus   . Thyroid cancer ([place])   . Unspecified essential hypertension     Patient Active Problem List   Diagnosis Date Noted  . 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 Constipation 01/02/2012  . BRBPR (bright red blood per rectum) 01/02/2012  . VITAMIN D DEFICIENCY 10/22/2009  . Diverticulosis of large intestine 10/22/2009  . ABDOMINAL PAIN, EPIGASTRIC 10/22/2009  . THYROID CANCER, HX OF 10/22/2009  . HYPOTHYROIDISM, POSTSURGICAL 10/20/2009  . HYPERTENSION 10/20/2009  . ESOPHAGEAL STRICTURE 10/20/2009    . GERD 10/20/2009  . HIATAL HERNIA 10/20/2009    Past Surgical History:  Procedure Laterality Date  . CARDIOVERSION  08/08/2012   Procedure: CARDIOVERSION;  Surgeon: Laverda Page, MD;  Location: [facility];  Service: Cardiovascular;  Laterality: N/A;  . COLONOSCOPY    . HERNIA REPAIR  1992  . THYROIDECTOMY  1990   Dr Deon Pilling       Home Medications    Prior to Admission medications   Medication Sig Start Date End Date Taking? Authorizing Provider  aspirin 81 MG tablet Take 81 mg by mouth daily.    [provider]  hydrALAZINE (APRESOLINE) 25 MG tablet Take 1 tablet by mouth 2 (two) times daily. 05/29/16   [provider]  levothyroxine (SYNTHROID, LEVOTHROID) 100 MCG tablet Take 100 mcg by mouth daily.    [provider]  nebivolol (BYSTOLIC) 2.5 MG tablet Take 2.5 mg by mouth daily.    [provider]  ofloxacin (OCUFLOX) 0.3 % ophthalmic solution Instill 1 drop BID in operative eye starting 2 days prior to surgery and continue 2 weeks after surgery. 05/18/16   [provider]  polyethylene glycol (MIRALAX / GLYCOLAX) packet Take 17 g by mouth 2 (two) times daily. 05/30/16   Doran Stabler, MD    Family History No family history on file.  Social History Social History   Tobacco Use  . Smoking status: Never Smoker  . Smokeless tobacco: Never Used  Substance Use  Topics  . Alcohol use: No  . Drug use: No     Allergies   Amlodipine; Chlorthalidone; Codeine phosphate; Hydrochlorothiazide w-triamterene; Prednisone; Sulfamethoxazole; and Triamterene   Review of Systems Review of Systems  Constitutional: Negative for fever.  Gastrointestinal: Positive for hematochezia. Negative for abdominal pain and hematemesis.  Neurological: Negative for dizziness and light-headedness.  All other systems reviewed and are negative.    Physical Exam Updated Vital Signs BP 106/76 (BP Location: Right Arm)   Pulse 70   Temp (!) 97.5 F  (36.4 C) (Oral)   Resp (!) 24   SpO2 99%   Physical Exam  Constitutional: He is oriented to person, place, and time. He appears well-developed and well-nourished. No distress.  HENT:  Head: Normocephalic and atraumatic.  Mouth/Throat: Oropharynx is clear and moist.  Neck: Normal range of motion. Neck supple.  Cardiovascular: Normal rate and regular rhythm. Exam reveals no friction rub.  No murmur heard. Pulmonary/Chest: Effort normal and breath sounds normal. No respiratory distress. He has no wheezes. He has no rales.  Abdominal: Soft. Bowel sounds are normal. He exhibits no distension. There is no tenderness.  Genitourinary: Rectum normal.  Genitourinary Comments: There is maroon and bright red blood in the rectal vault.  There are no obvious hemorrhoids or external fissures.  Musculoskeletal: Normal range of motion. He exhibits no edema.  Neurological: He is alert and oriented to person, place, and time. Coordination normal.  Skin: Skin is warm and dry. He is not diaphoretic.  Nursing note and vitals reviewed.    ED Treatments / Results  Labs (all labs ordered are listed, but only abnormal results are displayed) Labs Reviewed  COMPREHENSIVE METABOLIC PANEL  CBC  PROTIME-INR  TYPE AND SCREEN    EKG  EKG Interpretation None       Radiology No results found.  Procedures Procedures (including critical care time)  Medications Ordered in ED Medications - No data to display   Initial Impression / Assessment and Plan / ED Course  I have reviewed the triage vital signs and the nursing notes.  Pertinent labs & imaging results that were available during my care of the patient were reviewed by me and considered in my medical decision making (see chart for details).  Patient presents with large amounts of rectal bleeding, most likely related to a bleeding diverticulum.  He is hemodynamically stable, however hemoglobin is 11.8 which is lower than baseline.  Care is been  discussed with Dr. Ardis Hughs from gastroenterology and patient will be admitted to the hospitalist service under the care of Dr. Blaine Hamper.  Final Clinical Impressions(s) / ED Diagnoses   Final diagnoses:  None    ED Discharge Orders    None       Veryl Speak, MD 09/24/17 [phone]

## 2017-09-24 NOTE — Progress Notes (Signed)
Patient is admitted this am,  Vital stable, denies pain, family in room Gi following.

## 2017-09-24 NOTE — ED Notes (Signed)
Bed: WA09 Expected date:  Expected time:  Means of arrival:  Comments: 60 m rectal bleed

## 2017-09-24 NOTE — ED Triage Notes (Signed)
Patient brought in by EMS. Patient reports large amount of blood in stool upon awakening this morning at about 0230. No prior history of rectal bleeding.

## 2017-09-24 NOTE — Consult Note (Signed)
Consultation  Referring Provider: Dr. Blaine Hamper     Primary Care Physician:  Jilda Panda, MD Primary Gastroenterologist: Dr. Loletha Carrow        Reason for Consultation: Rectal Bleeding             HPI:   Ian Weber is a 83 y.o. AA male with a past medical history as listed below, including diverticulosis, hypertension, post-thyroid cancer surgical hypothyroidism, GERD, anxiety, esophageal stricture and IBS, who presented to the ER early this morning around 3 in the morning with a complaint of rectal bleeding.    Today, the patient is found with his family member by his side. He explains that early this morning around 2:30 he woke up and had a bowel movement with a large amount of bright red blood in it. There was also some "darker blood" mixed in. Patient had 2 or 3 more episodes. He then proceeded to the ER and had one further episode about 30 minutes after arriving here around 3:30 in the morning. Patient has had no further bright red blood per rectum since then. Patient denies any other symptoms including abdominal pain or rectal pain. He tells me he had been having his normal bowel movements which was "constipation" prior to this. Patient has been using MiraLAX 2 capfuls twice a day as well as milk of magnesia 2 times a week and an enema 2 times a week per recommendations from Dr. Loletha Carrow. He tells me that "my bowels are always hard to move". Patient denies any previous episodes of rectal bleeding. Patient tells me that "I feel fine" other than seeing this blood.    Patient denies fever, chills, dizziness, syncope, weight loss, anorexia, nausea, vomiting, heartburn or reflux.   Previous GI history: 05/2017-Dr. Danis office visit: Following up on chronic constipation despite Linzess and twice daily MiraLAX, at that time he had started an over-the-counter suppository which helped him have a daily bowel movement; constipation was thought due to slow transit due to severe diverticulosis of the colon,  Linzess was stopped as this was not helping and he was continued on twice daily MiraLAX as well as suppositories and twice a week dose of milk of magnesia 01/2012-colonoscopy, Dr. [name]: Impression-severe diverticulosis and otherwise normal 06/2010-EGD, Dr. [name]: Impression-esophagitis  Past Medical History:  Diagnosis Date  . Depression   . Diverticulosis of colon (without mention of hemorrhage)   . Esophageal reflux   . Hiatal hernia   . Hypertension   . Hypothyroidism   . Irritable bowel syndrome   . Lactose intolerance   . Stricture and stenosis of esophagus   . Thyroid cancer ([place])   . Unspecified essential hypertension     Past Surgical History:  Procedure Laterality Date  . CARDIOVERSION  08/08/2012   Procedure: CARDIOVERSION;  Surgeon: Laverda Page, MD;  Location: [facility];  Service: Cardiovascular;  Laterality: N/A;  . COLONOSCOPY    . HERNIA REPAIR  1992  . THYROIDECTOMY  1990   Dr Deon Pilling    Family History: No colon cancer or polyps  Social History   Tobacco Use  . Smoking status: Never Smoker  . Smokeless tobacco: Never Used  Substance Use Topics  . Alcohol use: No  . Drug use: No    Prior to Admission medications   Medication Sig Start Date End Date Taking? Authorizing Provider  ALPRAZolam Duanne Moron) 0.25 MG tablet Take 0.25 mg by mouth at bedtime as needed for anxiety or sleep.  04/14/16  Yes  [provider]  aspirin 81 MG tablet Take 81 mg by mouth daily.   Yes [provider]  levothyroxine (SYNTHROID, LEVOTHROID) 100 MCG tablet Take 100 mcg by mouth daily.   Yes [provider]  losartan (COZAAR) 50 MG tablet Take 50 mg by mouth daily.   Yes [provider]  nebivolol (BYSTOLIC) 2.5 MG tablet Take 2.5 mg by mouth daily.   Yes [provider]  polyethylene glycol (MIRALAX / GLYCOLAX) packet Take 17 g by mouth 2 (two) times daily. Patient taking differently: Take 17 g by mouth daily.  05/30/16  Yes Danis,  Kirke Corin, MD    Current Facility-Administered Medications  Medication Dose Route Frequency Provider Last Rate Last Dose  . 0.9 %  sodium chloride infusion   Intravenous Continuous Ivor Costa, MD      . acetaminophen (TYLENOL) tablet 650 mg  650 mg Oral Q6H PRN Ivor Costa, MD       Or  . acetaminophen (TYLENOL) suppository 650 mg  650 mg Rectal Q6H PRN Ivor Costa, MD      . ALPRAZolam Duanne Moron) tablet 0.25 mg  0.25 mg Oral QHS PRN Ivor Costa, MD      . hydrALAZINE (APRESOLINE) injection 5 mg  5 mg Intravenous Q2H PRN Ivor Costa, MD      . levothyroxine (SYNTHROID, LEVOTHROID) tablet 100 mcg  100 mcg Oral Daily Ivor Costa, MD      . losartan (COZAAR) tablet 50 mg  50 mg Oral Daily Ivor Costa, MD      . nebivolol (BYSTOLIC) tablet 2.5 mg  2.5 mg Oral Daily Ivor Costa, MD      . ondansetron [facility]) tablet 4 mg  4 mg Oral Q6H PRN Ivor Costa, MD       Or  . ondansetron (ZOFRAN) injection 4 mg  4 mg Intravenous Q6H PRN Ivor Costa, MD      . pantoprazole (PROTONIX) injection 40 mg  40 mg Intravenous Q12H Ivor Costa, MD      . zolpidem (AMBIEN) tablet 5 mg  5 mg Oral QHS PRN Ivor Costa, MD       Current Outpatient Medications  Medication Sig Dispense Refill  . ALPRAZolam (XANAX) 0.25 MG tablet Take 0.25 mg by mouth at bedtime as needed for anxiety or sleep.     Marland Kitchen aspirin 81 MG tablet Take 81 mg by mouth daily.    Marland Kitchen levothyroxine (SYNTHROID, LEVOTHROID) 100 MCG tablet Take 100 mcg by mouth daily.    Marland Kitchen losartan (COZAAR) 50 MG tablet Take 50 mg by mouth daily.    . nebivolol (BYSTOLIC) 2.5 MG tablet Take 2.5 mg by mouth daily.    . polyethylene glycol (MIRALAX / GLYCOLAX) packet Take 17 g by mouth 2 (two) times daily. (Patient taking differently: Take 17 g by mouth daily. ) 60 each 4    Allergies as of 09/24/2017 - Review Complete 09/24/2017  Allergen Reaction Noted  . Amlodipine  09/04/2013  . Chlorthalidone  09/04/2013  . Codeine phosphate  08/13/2006  . Hydrochlorothiazide w-triamterene   09/04/2013  . Prednisone  06/16/2010  . Sulfamethoxazole  08/13/2006  . Triamterene  09/04/2013     Review of Systems:    Constitutional: No weight loss, fever, chills, weakness or fatigue Skin: No rash  Cardiovascular: No chest pain Respiratory: No SOB  Gastrointestinal: See HPI and otherwise negative Genitourinary: No dysuria  Neurological: No headache, dizziness or syncope Musculoskeletal: No new muscle or joint pain Hematologic: No  bruising Psychiatric: No history of depression or anxiety   Physical Exam:  Vital signs in last 24 hours: Temp:  [97.5 F (36.4 C)] 97.5 F (36.4 C) (12/17 0310) Pulse Rate:  [51-70] 51 (12/17 0607) Resp:  [12-24] 12 (12/17 0607) BP: (106-120)/(63-76) 113/63 (12/17 0607) SpO2:  [99 %-100 %] 100 % (12/17 0607) Weight:  [190 lb (86.2 kg)] 190 lb (86.2 kg) (12/17 0502)   General:   Pleasant AA male appears to be in NAD, Well developed, Well nourished, alert and cooperative Head:  Normocephalic and atraumatic. Eyes:   PEERL, EOMI. No icterus. Conjunctiva pink. Ears:  Normal auditory acuity. Neck:  Supple Throat: Oral cavity and pharynx without inflammation, swelling or lesion. Teeth in good condition. Lungs: Respirations even and unlabored. Lungs clear to auscultation bilaterally.   No wheezes, crackles, or rhonchi.  Heart: Normal S1, S2. No MRG. Regular rate and rhythm. No peripheral edema, cyanosis or pallor.  Abdomen:  Soft, nondistended, nontender. No rebound or guarding. Normal bowel sounds. No appreciable masses or hepatomegaly. Rectal:  External: dried blood around rectum, no hemorrhoids or fissure, no ttp; Internal exam: no mass or fullness or ttp, discharge of brb/maroon colored blood Msk:  Symmetrical without gross deformities.  Extremities:  Without edema, no deformity or joint abnormality.  Neurologic:  Alert and  oriented x4;  grossly normal neurologically.  Skin:   Dry and intact without significant lesions or rashes. Psychiatric:  Demonstrates good judgement and reason without abnormal affect or behaviors.   LAB RESULTS: Recent Labs    09/24/17 0326  WBC 4.4  HGB 11.8*  HCT 35.4*  PLT 173   BMET Recent Labs    09/24/17 0326  NA 141  K 3.9  CL 110  CO2 26  GLUCOSE 125*  BUN 16  CREATININE 0.74  CALCIUM 8.6*   LFT Recent Labs    09/24/17 0326  PROT 6.1*  ALBUMIN 3.5  AST 18  ALT 14*  ALKPHOS 55  BILITOT 0.7   PT/INR Recent Labs    09/24/17 0326  LABPROT 14.5  INR 1.14    PREVIOUS ENDOSCOPIES:            See HPI   Impression / Plan:   Impression: 1. Rectal bleeding: So far 3 episodes, last around 3:30 this morning, rectal exam with no hemorrhoids or tenderness or mass, last colo 2013 with severe diverticulosis; Most likely diverticular bleeding 2. Chronic constipation: Only somewhat helped by 2 capfuls of MiraLAX twice a day, twice weekly milk of magnesia and twice weekly suppositories at home, no help from [place] in the past 3. Anemia: Hemoglobin 11.8 at time of admission, previously 13.6 on 04/2016, consider relation to acute blood loss  Plan: 1. Agree with admission to observe hemoglobin with transfusion as needed less than 7 2. Continue supportive measures including IV fluids 3. Will discuss with Dr. Carlean Purl, it has been 5 years since patient's last colonoscopy, may consider prepping today versus observation for further signs of acute bleeding 4. Patient may be on a clear liquid diet 5. Agree with holding ASA 6. Please await final recommendations Dr. Carlean Purl later today  Thank you for your kind consultation, we will continue to follow.  Lavone Nian Lemmon  09/24/2017, 9:40 AM Pager #: [phone]    [facility] Attending   I have taken an interval history, reviewed the chart and examined the patient. I agree with the Advanced Practitioner's note, impression and recommendations.     Painless lower GI bleed -  seems most likely to be diverticulosis but since last  colonoscopy 5 years agio chance of neoplasia increases so have decided to perform colonoscopy tomorrow.  The risks and benefits as well as alternatives of endoscopic procedure(s) have been discussed and reviewed. All questions answered. The patient agrees to proceed.  Gatha Mayer, MD, [facility] [phone] (pager) 09/24/2017 7:08 PM

## 2017-09-24 NOTE — H&P (Signed)
History and Physical    DONACIANO RANGE YIF:[id number] DOB: [birth year] DOA: 09/24/2017  Referring MD/NP/PA:   PCP: Jilda Panda, MD   Patient coming from:  The patient is coming from home.  At baseline, pt is independent for most of ADL.  Chief Complaint: rectal bleeding  HPI: [name] is a 83 y.o. male with medical history significant of diverticulosis, hypertension, post thyroid cancer surgical hypothyroidism, GERD, anxiety, esophageal stricture, IBS, who presents with rectal bleeding.  Patient reports that he had a large amount of blood in stool upon awakening this morning at about 0230. The bloody stool is bright red and is mixed some dark blood. Patient does not have nausea, vomiting, abdominal pain. Denies chest pain, shortness of breath, dizziness, lightheadedness or palpitation. No cough, fever or chills. Denies symptoms of UTI or unilateral weakness. Patient had colonoscopy on 01/08/12 by Dr. [name], which showed severe diverticulosis. He had EGD on 06/29/10 by Dr. [name], which showed esophagitis.  ED Course: pt was found to have Hemoglobin 11.8, INR 1.014, electrolytes renal function okay, bradycardia, oxygen saturation section 99% on room air, temperature normal, blood pressure normal. Patient is admitted to telemetry bed as inpatient. Dr. Ardis Hughs of GI was consulted by EDP.  Review of Systems:   General: no fevers, chills, no body weight gain, has fatigue HEENT: no blurry vision, hearing changes or sore throat Respiratory: no dyspnea, coughing, wheezing CV: no chest pain, no palpitations GI: no nausea, vomiting, abdominal pain, diarrhea, constipation. Had rectal bleeding. GU: no dysuria, burning on urination, increased urinary frequency, hematuria  Ext: no leg edema Neuro: no unilateral weakness, numbness, or tingling, no vision change or hearing loss Skin: no rash, no skin tear. MSK: No muscle spasm, no deformity, no limitation of range of movement in spin Heme: No  easy bruising.  Travel history: No recent long distant travel.  Allergy:  Allergies  Allergen Reactions  . Amlodipine   . Chlorthalidone   . Codeine Phosphate     REACTION: weakness, sweating  . Hydrochlorothiazide W-Triamterene   . Prednisone     REACTION: unspecified  . Sulfamethoxazole     REACTION: itching  . Triamterene     Past Medical History:  Diagnosis Date  . Depression   . Diverticulosis of colon (without mention of hemorrhage)   . Esophageal reflux   . Hiatal hernia   . Hypertension   . Hypothyroidism   . Irritable bowel syndrome   . Lactose intolerance   . Stricture and stenosis of esophagus   . Thyroid cancer ([place])   . Unspecified essential hypertension     Past Surgical History:  Procedure Laterality Date  . CARDIOVERSION  08/08/2012   Procedure: CARDIOVERSION;  Surgeon: Laverda Page, MD;  Location: [facility];  Service: Cardiovascular;  Laterality: N/A;  . COLONOSCOPY    . HERNIA REPAIR  1992  . THYROIDECTOMY  1990   Dr Deon Pilling    Social History:  reports that  has never smoked. he has never used smokeless tobacco. He reports that he does not drink alcohol or use drugs.  Family History: Reviewed with patient, but patient does not know any family medical history.  Prior to Admission medications   Medication Sig Start Date End Date Taking? Authorizing Provider  ALPRAZolam Duanne Moron) 0.25 MG tablet Take 0.25 mg by mouth at bedtime as needed for anxiety or sleep.  04/14/16  Yes [provider]  aspirin 81 MG tablet Take 81 mg by mouth daily.   Yes  [provider]  levothyroxine (SYNTHROID, LEVOTHROID) 100 MCG tablet Take 100 mcg by mouth daily.   Yes [provider]  losartan (COZAAR) 50 MG tablet Take 50 mg by mouth daily.   Yes [provider]  nebivolol (BYSTOLIC) 2.5 MG tablet Take 2.5 mg by mouth daily.   Yes [provider]  polyethylene glycol (MIRALAX / GLYCOLAX) packet Take 17 g by mouth 2 (two) times  daily. Patient taking differently: Take 17 g by mouth daily.  05/30/16  Yes Doran Stabler, MD    Physical Exam: Vitals:   09/24/17 0310 09/24/17 0501 09/24/17 0502 09/24/17 0504  BP: 106/76 120/72    Pulse: 70 64    Resp: (!) 24 16    Temp: (!) 97.5 F (36.4 C)     TempSrc: Oral     SpO2: 99%   100%  Weight:   86.2 kg (190 lb)   Height:   6' (1.829 m)    General: Not in acute distress HEENT:       Eyes: PERRL, EOMI, no scleral icterus.       ENT: No discharge from the ears and nose, no pharynx injection, no tonsillar enlargement.        Neck: No JVD, no bruit, no mass felt. Heme: No neck lymph node enlargement. Cardiac: S1/S2, RRR, No murmurs, No gallops or rubs. Respiratory: No rales, wheezing, rhonchi or rubs. GI: Soft, nondistended, nontender, no rebound pain, no organomegaly, BS present. GU: No hematuria Ext: No pitting leg edema bilaterally. 2+DP/PT pulse bilaterally. Musculoskeletal: No joint deformities, No joint redness or warmth, no limitation of ROM in spin. Skin: No rashes.  Neuro: Alert, oriented X3, cranial nerves II-XII grossly intact, moves all extremities normally. Psych: Patient is not psychotic, no suicidal or hemocidal ideation.  Labs on Admission: I have personally reviewed following labs and imaging studies  CBC: Recent Labs  Lab 09/24/17 0326  WBC 4.4  HGB 11.8*  HCT 35.4*  MCV 93.2  PLT 462   Basic Metabolic Panel: Recent Labs  Lab 09/24/17 0326  NA 141  K 3.9  CL 110  CO2 26  GLUCOSE 125*  BUN 16  CREATININE 0.74  CALCIUM 8.6*   GFR: Estimated Creatinine Clearance: 79.5 mL/min (by C-G formula based on SCr of 0.74 mg/dL). Liver Function Tests: Recent Labs  Lab 09/24/17 0326  AST 18  ALT 14*  ALKPHOS 55  BILITOT 0.7  PROT 6.1*  ALBUMIN 3.5   No results for input(s): LIPASE, AMYLASE in the last 168 hours. No results for input(s): AMMONIA in the last 168 hours. Coagulation Profile: Recent Labs  Lab 09/24/17 0326  INR  1.14   Cardiac Enzymes: No results for input(s): CKTOTAL, CKMB, CKMBINDEX, TROPONINI in the last 168 hours. BNP (last 3 results) No results for input(s): PROBNP in the last 8760 hours. HbA1C: No results for input(s): HGBA1C in the last 72 hours. CBG: No results for input(s): GLUCAP in the last 168 hours. Lipid Profile: No results for input(s): CHOL, HDL, LDLCALC, TRIG, CHOLHDL, LDLDIRECT in the last 72 hours. Thyroid Function Tests: No results for input(s): TSH, T4TOTAL, FREET4, T3FREE, THYROIDAB in the last 72 hours. Anemia Panel: No results for input(s): VITAMINB12, FOLATE, FERRITIN, TIBC, IRON, RETICCTPCT in the last 72 hours. Urine analysis:    Component Value Date/Time   COLORURINE STRAW (A) 11/19/2012 1138   APPEARANCEUR CLEAR 11/19/2012 1138   LABSPEC 1.006 11/19/2012 1138   PHURINE 7.5 11/19/2012 1138   GLUCOSEU NEGATIVE  11/19/2012 1138   HGBUR TRACE (A) 11/19/2012 1138   BILIRUBINUR NEGATIVE 11/19/2012 1138   KETONESUR NEGATIVE 11/19/2012 1138   PROTEINUR NEGATIVE 11/19/2012 1138   UROBILINOGEN 0.2 11/19/2012 1138   NITRITE NEGATIVE 11/19/2012 1138   LEUKOCYTESUR NEGATIVE 11/19/2012 1138   Sepsis Labs: @LABRCNTIP (procalcitonin:4,lacticidven:4) )No results found for this or any previous visit (from the past 240 hour(s)).   Radiological Exams on Admission: No results found.   EKG:  Not done in ED, will get one.   Assessment/Plan Principal Problem:   GIB (gastrointestinal bleeding) Active Problems:   HYPOTHYROIDISM, POSTSURGICAL   Essential hypertension   Diverticulosis of large intestine   Anxiety   GIB (gastrointestinal bleeding): Patient had painless rectal bleeding. Giving his history of severe diverticulosis, he most likely has had diverticular bleeding today. Currently hemodynamically stable. Patient is asymptomatic. Dr. Ardis Hughs of GI was consulted, will see pt in morning.  - will admit to tele bed as inpt - GI consulted by Ed, will follow up  recommendations - NPO - hold ASA - IVF: 500 mL NS bolus, then at 100 mL/hr - Start IV pantoprazole 40 mg bid - Zofran IV for nausea - Avoid NSAIDs and SQ heparin - Maintain IV access (2 large bore IVs if possible). - Monitor closely and follow q6h cbc, transfuse as necessary,if Hgb<7.0 - LaB: INR, PTT and type screen  HYPOTHYROIDISM, POSTSURGICAL: Last TSH was 0.874 on 08/08/12 -Continue home Synthroid -Check TSH  HTN:  -Continue home medications:Cozaar, bystolic -IV hydralazine prn  Anxiety: -Continue home when necessary Xanax   DVT ppx: SCD Code Status: Full code Family Communication:   Yes, patient's wife at bed side Disposition Plan:  Anticipate discharge back to previous home environment Consults called: Dr. Edison Nasuti of GI  Admission status: Inpatient/tele     Date of Service 09/24/2017    [facility] Pager [phone]  If 7PM-7AM, please contact night-coverage [email or web address] Password [email or web address] 09/24/2017, 6:02 AM

## 2017-09-25 ENCOUNTER — Encounter (HOSPITAL_COMMUNITY): Payer: Self-pay | Admitting: *Deleted

## 2017-09-25 ENCOUNTER — Encounter (HOSPITAL_COMMUNITY)
Admission: EM | Disposition: A | Discharge status: Home / Self Care | Payer: Self-pay | Source: Home / Self Care | Attending: Internal Medicine

## 2017-09-25 DIAGNOSIS — K5731 Diverticulosis of large intestine without perforation or abscess with bleeding: Principal | ICD-10-CM

## 2017-09-25 DIAGNOSIS — I480 Paroxysmal atrial fibrillation: Secondary | ICD-10-CM

## 2017-09-25 HISTORY — PX: COLONOSCOPY WITH PROPOFOL: SHX5780

## 2017-09-25 LAB — BASIC METABOLIC PANEL
Anion gap: 5 (ref 5–15)
BUN: 14 mg/dL (ref 6–20)
CALCIUM: 7.8 mg/dL — AB (ref 8.9–10.3)
CO2: 25 mmol/L (ref 22–32)
Chloride: 112 mmol/L — ABNORMAL HIGH (ref 101–111)
Creatinine, Ser: 0.62 mg/dL (ref 0.61–1.24)
GFR calc Af Amer: >60 mL/min (ref >60)
GLUCOSE: 129 mg/dL — AB (ref 65–99)
Potassium: 3.9 mmol/L (ref 3.5–5.1)
Sodium: 142 mmol/L (ref 135–145)

## 2017-09-25 LAB — CBC
HCT: 22.8 % — ABNORMAL LOW (ref 39.0–52.0)
HEMOGLOBIN: 7.7 g/dL — AB (ref 13.0–17.0)
MCH: 31.2 pg (ref 26.0–34.0)
MCHC: 33.8 g/dL (ref 30.0–36.0)
MCV: 92.3 fL (ref 78.0–100.0)
PLATELETS: 126 10*3/uL — AB (ref 150–400)
RBC: 2.47 MIL/uL — AB (ref 4.22–5.81)
RDW: 13.1 % (ref 11.5–15.5)
WBC: 5.6 10*3/uL (ref 4.0–10.5)

## 2017-09-25 LAB — GLUCOSE, CAPILLARY: Glucose-Capillary: 100 mg/dL — ABNORMAL HIGH (ref 65–99)

## 2017-09-25 LAB — MAGNESIUM: Magnesium: 1.6 mg/dL — ABNORMAL LOW (ref 1.7–2.4)

## 2017-09-25 LAB — HEMOGLOBIN AND HEMATOCRIT, BLOOD
HCT: 25.9 % — ABNORMAL LOW (ref 39.0–52.0)
Hemoglobin: 8.9 g/dL — ABNORMAL LOW (ref 13.0–17.0)

## 2017-09-25 LAB — PREPARE RBC (CROSSMATCH)

## 2017-09-25 SURGERY — COLONOSCOPY WITH PROPOFOL
Anesthesia: Moderate Sedation

## 2017-09-25 MED ORDER — MAGNESIUM SULFATE 2 GM/50ML IV SOLN
2.0000 g | Freq: Once | INTRAVENOUS | Status: AC
Start: 2017-09-25 — End: 2017-09-25
  Administered 2017-09-25: 2 g via INTRAVENOUS
  Filled 2017-09-25: qty 50

## 2017-09-25 MED ORDER — FENTANYL CITRATE (PF) 100 MCG/2ML IJ SOLN
INTRAMUSCULAR | Status: DC | PRN
  Administered 2017-09-25: 25 ug via INTRAVENOUS
  Administered 2017-09-25: 12.5 ug via INTRAVENOUS

## 2017-09-25 MED ORDER — SODIUM CHLORIDE 0.9 % IV BOLUS (SEPSIS)
500.0000 mL | Freq: Once | INTRAVENOUS | Status: AC
  Administered 2017-09-25: 500 mL via INTRAVENOUS

## 2017-09-25 MED ORDER — MIDAZOLAM HCL 10 MG/2ML IJ SOLN
INTRAMUSCULAR | Status: DC | PRN
  Administered 2017-09-25: 2 mg via INTRAVENOUS
  Administered 2017-09-25: 1 mg via INTRAVENOUS

## 2017-09-25 MED ORDER — MIDAZOLAM HCL 5 MG/ML IJ SOLN
INTRAMUSCULAR | Status: AC
  Filled 2017-09-25: qty 2

## 2017-09-25 MED ORDER — SODIUM CHLORIDE 0.9 % IV SOLN
Freq: Once | INTRAVENOUS | Status: AC
  Administered 2017-09-25: 18:00:00 via INTRAVENOUS

## 2017-09-25 MED ORDER — FENTANYL CITRATE (PF) 100 MCG/2ML IJ SOLN
INTRAMUSCULAR | Status: AC
  Filled 2017-09-25: qty 2

## 2017-09-25 MED ORDER — ZOLPIDEM TARTRATE 5 MG PO TABS: 5.0000 mg | ORAL_TABLET | Freq: Every evening | ORAL | Status: DC | PRN

## 2017-09-25 SURGICAL SUPPLY — 22 items

## 2017-09-25 NOTE — Op Note (Signed)
[facility] Patient Name: Ian Weber Procedure Date: 09/25/2017 MRN: [id number] Attending MD: Gatha Mayer , MD Date of Birth: February 04, 1934 CSN: [id number] Age: 83 Admit Type: Inpatient Procedure:                Colonoscopy Indications:              Hematochezia Providers:                Gatha Mayer, MD, Cleda Daub, RN, Elspeth Cho Tech., Technician Referring MD:              Medicines:                Propofol per Anesthesia, Monitored Anesthesia Care Complications:            No immediate complications. Estimated Blood Loss:     Estimated blood loss: none. Procedure:                Pre-Anesthesia Assessment:                           - Prior to the procedure, a History and Physical                            was performed, and patient medications and                            allergies were reviewed. The patient's tolerance of                            previous anesthesia was also reviewed. The risks                            and benefits of the procedure and the sedation                            options and risks were discussed with the patient.                            All questions were answered, and informed consent                            was obtained. Prior Anticoagulants: The patient has                            taken no previous anticoagulant or antiplatelet                            agents. ASA Grade Assessment: III - A patient with                            severe systemic disease. After reviewing the risks  and benefits, the patient was deemed in                            satisfactory condition to undergo the procedure.                           After obtaining informed consent, the colonoscope                            was passed under direct vision. Throughout the                            procedure, the patient's blood pressure, pulse, and                            oxygen  saturations were monitored continuously. The                            EC-[id number] ([id number]) scope was introduced through                            the anus and advanced to the the cecum, identified                            by appendiceal orifice and ileocecal valve. The                            colonoscopy was somewhat difficult due to poor                            endoscopic visualization and significant looping.                            Successful completion of the procedure was aided by                            using manual pressure. The patient tolerated the                            procedure well. The quality of the bowel                            preparation was inadequate. The ileocecal valve,                            appendiceal orifice, and rectum were photographed.                            The bowel preparation used was MoviPrep. Scope In: 2:19:24 PM Scope Out: 2:37:13 PM Scope Withdrawal Time: 0 hours 7 minutes 14 seconds  Total Procedure Duration: 0 hours 17 minutes 49 seconds  Findings:      The perianal and digital rectal examinations were normal.      Multiple diverticula were found in the sigmoid colon.  Red blood/old blood was found in the entire colon. Adherent and unable       to clear.Estimated blood loss: none.      The retroflexed view of the distal rectum and anal verge was normal and       showed no anal or rectal abnormalities, limited by retained blood. Impression:               - Preparation of the colon was inadequate.                           - Diverticulosis in the sigmoid colon. probable                            source of bleed                           - Blood in the entire examined colon.                           - The distal rectum and anal verge are normal on                            retroflexion view.                           - No specimens collected. Moderate Sedation:      Moderate (conscious) sedation was administered  by the endoscopy nurse       and supervised by the endoscopist. The following parameters were       monitored: oxygen saturation, heart rate, blood pressure, and response       to care. Total physician intraservice time was 20 minutes. Recommendation:           - Patient has a contact number available for                            emergencies. The signs and symptoms of potential                            delayed complications were discussed with the                            patient. Return to normal activities tomorrow.                            Written discharge instructions were provided to the                            patient.                           - Clear liquid diet.                           - Return patient to hospital ward for ongoing care.                           -  With BUN 14 and clinical scenarion still think                            diverticular colon bleeding and blood throughout                            the colon from prep                           Could not see bleeding site here                           Will transfuse 2 [place] and observe/support, clears ok,                            reassess in AM - next step would be CT angio of                            abdomen/pelvis if suspect ongoing active bleeding                           - Continue present medications.                           - Repeat colonoscopy not necessary unless                            persistent bleeding ?'s require. Procedure Code(s):        --- Professional ---                           [phone], Colonoscopy, flexible; diagnostic, including                            collection of specimen(s) by brushing or washing,                            when performed (separate procedure)                           G0500, Moderate sedation services provided by the                            same physician or other qualified health care                            professional performing a  gastrointestinal                            endoscopic service that sedation supports,                            requiring the presence of an independent trained  observer to assist in the monitoring of the                            patient's level of consciousness and physiological                            status; initial 15 minutes of intra-service time;                            patient age 15 years or older (additional time may                            be reported with [phone], as appropriate) Diagnosis Code(s):        --- Professional ---                           K92.2, Gastrointestinal hemorrhage, unspecified                           K92.1, Melena (includes Hematochezia)                           K57.30, Diverticulosis of large intestine without                            perforation or abscess without bleeding CPT copyright 2016 [employer]. All rights reserved. The codes documented in this report are preliminary and upon coder review may  be revised to meet current compliance requirements. Gatha Mayer, MD 09/25/2017 2:56:47 PM This report has been signed electronically. Number of Addenda: 0

## 2017-09-25 NOTE — Progress Notes (Signed)
Patient had a syncopal episode after having a bowel movement. He lowered himself to the ground and his wife alerted [place] to his room. Bloody stool in toilet and on the floor-- rapid was called. VS are stable. MD notified, ordered fluid bolus and recheck his H&H.

## 2017-09-25 NOTE — Care Management Note (Signed)
Case Management Note  Patient Details  Name: Ian Weber MRN: [id number] Date of Birth: 08-22-1934  Subjective/Objective:                  Rectal bleeding  ADate: September 25, 2017 Velva Harman, BSN, [place], [place] Chart and notes review for patient progress and needs. Will follow for case management and discharge needs. Next review date: [id number] ction/Plan:   Expected Discharge Date:  (unknown)               Expected Discharge Plan:  Home/Self Care  In-House Referral:     Discharge planning Services  CM Consult  Post Acute Care Choice:    Choice offered to:     DME Arranged:    DME Agency:     HH Arranged:    HH Agency:     Status of Service:  In process, will continue to follow  If discussed at Long Length of Stay Meetings, dates discussed:    Additional Comments:  Leeroy Cha, RN 09/25/2017, 8:49 AM

## 2017-09-25 NOTE — Progress Notes (Signed)
PROGRESS NOTE  GOLDEN GILREATH TMA:[id number] DOB: Jul 16, 1934 DOA: 09/24/2017 PCP: Jilda Panda, MD  HPI/Recap of past 24 hours:  preSyncope episode at midnight after bloody bm He received fluids bolus He received bowel prep for planned colonoscopy today Per RN , continue have blood in stool  Assessment/Plan: Principal Problem:   GIB (gastrointestinal bleeding) Active Problems:   HYPOTHYROIDISM, POSTSURGICAL   Essential hypertension   Diverticulosis of large intestine   Anxiety  GI bleed with blood loss anemia -hold home meds asa -colonoscopy today, transfuse if hgb less than 7-will follow GI recommendation  Presyncope: while on the toilet last night, tele unremarkable Continue hydration, may need prbc transfusion.  Hypomagnesemia: replace mag  HTN: bp low normal, hold home meds cozaar, continue bystolic with holding parameters  H/o PAF, he reports was  Taken off anticoagulation by his cardiologist due to he is not able to tolerate. Sinus rhythm here, continue bystolic  Bilateral lower extremity trace pitting edema/sysmetric Report chronic, monitor volume status.  Hypothyroidism: tsh 0.9, continue home meds  Right inguinal hernia: reducible   Code Status: full  Family Communication: patient and wife   Disposition Plan: not ready to discharge, need gi clearance   Consultants:  GI  Procedures:  Colonoscopy on 12/18  Antibiotics:  none   Objective: BP (!) 105/53 (BP Location: Right Arm)   Pulse 61   Temp 98.4 F (36.9 C) (Oral)   Resp 17   Ht 6' (1.829 m)   Wt 86.2 kg (190 lb)   SpO2 93%   BMI 25.77 kg/m   Intake/Output Summary (Last 24 hours) at 09/25/2017 0820 Last data filed at 09/25/2017 0335 Gross per 24 hour  Intake 1260 ml  Output -  Net 1260 ml   Filed Weights   09/24/17 0502  Weight: 86.2 kg (190 lb)    Exam: Patient is examined daily including today on 09/25/2017, exams remain the same as of yesterday except that has  changed    General:  NAD  Cardiovascular: RRR  Respiratory: CTABL  Abdomen: Soft/ND/NT, positive BS, right inguinal hernia , reducible, nontender  Musculoskeletal: bilateral lower extremity trace pitting Edema, report chronic  Neuro: alert, oriented   Data Reviewed: Basic Metabolic Panel: Recent Labs  Lab 09/24/17 0326 09/25/17 0042  NA 141 142  K 3.9 3.9  CL 110 112*  CO2 26 25  GLUCOSE 125* 129*  BUN 16 14  CREATININE 0.74 0.62  CALCIUM 8.6* 7.8*  MG  --  1.6*   Liver Function Tests: Recent Labs  Lab 09/24/17 0326  AST 18  ALT 14*  ALKPHOS 55  BILITOT 0.7  PROT 6.1*  ALBUMIN 3.5   No results for input(s): LIPASE, AMYLASE in the last 168 hours. No results for input(s): AMMONIA in the last 168 hours. CBC: Recent Labs  Lab 09/24/17 0326 09/24/17 1703 09/24/17 2337 09/25/17 0042  WBC 4.4 7.0 6.0  --   HGB 11.8* 11.2* 10.1* 8.9*  HCT 35.4* 34.0* 31.0* 25.9*  MCV 93.2 93.7 93.4  --   PLT 173 157 147*  --    Cardiac Enzymes:   No results for input(s): CKTOTAL, CKMB, CKMBINDEX, TROPONINI in the last 168 hours. BNP (last 3 results) No results for input(s): BNP in the last 8760 hours.  ProBNP (last 3 results) No results for input(s): PROBNP in the last 8760 hours.  CBG: Recent Labs  Lab 09/24/17 0830  GLUCAP 94    No results found for this or any previous visit (  from the past 240 hour(s)).   Studies: No results found.  Scheduled Meds: . levothyroxine  100 mcg Oral QAC breakfast  . nebivolol  2.5 mg Oral Daily  . pantoprazole (PROTONIX) IV  40 mg Intravenous Q12H    Continuous Infusions: . sodium chloride 50 mL/hr at 09/25/17 0143  . magnesium sulfate 1 - 4 g bolus IVPB       Time spent: 13mins  I have personally reviewed and interpreted on  09/25/2017 daily labs, tele strips, imagings as discussed above under date review session and assessment and plans.  I reviewed all nursing notes, pharmacy notes, consultant notes,  vitals,  pertinent old records  I have discussed plan of care as described above with RN , patient and family on 09/25/2017   Florencia Reasons MD, PhD  [facility] Pager [phone]. If 7PM-7AM, please contact night-coverage at [email or web address], password [email or web address] 09/25/2017, 8:20 AM  LOS: 1 day

## 2017-09-25 NOTE — Interval H&P Note (Signed)
History and Physical Interval Note:  09/25/2017 2:09 PM  Ian Weber  has presented today for surgery, with the diagnosis of Lower GI bleed  The various methods of treatment have been discussed with the patient and family. After consideration of risks, benefits and other options for treatment, the patient has consented to  Procedure(s) with comments: COLONOSCOPY WITH PROPOFOL (N/A) - moderate sedation acceptable also as a surgical intervention .  The patient's history has been reviewed, patient examined, no change in status, stable for surgery.  I have reviewed the patient's chart and labs.  Questions were answered to the patient's satisfaction.     Silvano Rusk

## 2017-09-26 ENCOUNTER — Encounter (HOSPITAL_COMMUNITY): Payer: Self-pay | Admitting: Internal Medicine

## 2017-09-26 DIAGNOSIS — D62 Acute posthemorrhagic anemia: Secondary | ICD-10-CM

## 2017-09-26 DIAGNOSIS — K922 Gastrointestinal hemorrhage, unspecified: Secondary | ICD-10-CM

## 2017-09-26 DIAGNOSIS — I10 Essential (primary) hypertension: Secondary | ICD-10-CM

## 2017-09-26 LAB — TYPE AND SCREEN
ABO/RH(D): A POS
Antibody Screen: NEGATIVE
UNIT DIVISION: 0
Unit division: 0

## 2017-09-26 LAB — CBC WITH DIFFERENTIAL/PLATELET
Basophils Absolute: 0 10*3/uL (ref 0.0–0.1)
Basophils Relative: 0 %
Eosinophils Absolute: 0.1 10*3/uL (ref 0.0–0.7)
Eosinophils Relative: 1 %
HEMATOCRIT: 25.9 % — AB (ref 39.0–52.0)
HEMOGLOBIN: 9 g/dL — AB (ref 13.0–17.0)
LYMPHS ABS: 2 10*3/uL (ref 0.7–4.0)
LYMPHS PCT: 32 %
MCH: 31.8 pg (ref 26.0–34.0)
MCHC: 34.7 g/dL (ref 30.0–36.0)
MCV: 91.5 fL (ref 78.0–100.0)
MONOS PCT: 9 %
Monocytes Absolute: 0.6 10*3/uL (ref 0.1–1.0)
NEUTROS ABS: 3.6 10*3/uL (ref 1.7–7.7)
NEUTROS PCT: 58 %
Platelets: 113 10*3/uL — ABNORMAL LOW (ref 150–400)
RBC: 2.83 MIL/uL — AB (ref 4.22–5.81)
RDW: 13.5 % (ref 11.5–15.5)
WBC: 6.3 10*3/uL (ref 4.0–10.5)

## 2017-09-26 LAB — HEMOGLOBIN AND HEMATOCRIT, BLOOD
HCT: 28.6 % — ABNORMAL LOW (ref 39.0–52.0)
HEMATOCRIT: 26.2 % — AB (ref 39.0–52.0)
HEMOGLOBIN: 9.8 g/dL — AB (ref 13.0–17.0)
Hemoglobin: 9.1 g/dL — ABNORMAL LOW (ref 13.0–17.0)

## 2017-09-26 LAB — BPAM RBC
BLOOD PRODUCT EXPIRATION DATE: 201901102359
BLOOD PRODUCT EXPIRATION DATE: 201901102359
ISSUE DATE / TIME: 201812181805
ISSUE DATE / TIME: 201812182308
Unit Type and Rh: 6200
Unit Type and Rh: 6200

## 2017-09-26 LAB — MAGNESIUM: MAGNESIUM: 1.9 mg/dL (ref 1.7–2.4)

## 2017-09-26 LAB — BASIC METABOLIC PANEL
Anion gap: 3 — ABNORMAL LOW (ref 5–15)
BUN: 10 mg/dL (ref 6–20)
CHLORIDE: 113 mmol/L — AB (ref 101–111)
CO2: 25 mmol/L (ref 22–32)
CREATININE: 0.59 mg/dL — AB (ref 0.61–1.24)
Calcium: 7.8 mg/dL — ABNORMAL LOW (ref 8.9–10.3)
GFR calc Af Amer: >60 mL/min (ref >60)
GFR calc non Af Amer: >60 mL/min (ref >60)
Glucose, Bld: 88 mg/dL (ref 65–99)
POTASSIUM: 4 mmol/L (ref 3.5–5.1)
Sodium: 141 mmol/L (ref 135–145)

## 2017-09-26 LAB — GLUCOSE, CAPILLARY: GLUCOSE-CAPILLARY: 86 mg/dL (ref 65–99)

## 2017-09-26 MED ORDER — BOOST / RESOURCE BREEZE PO LIQD CUSTOM
1.0000 | ORAL | Status: DC
  Administered 2017-09-26: 1 via ORAL

## 2017-09-26 MED ORDER — PANTOPRAZOLE SODIUM 40 MG PO TBEC
40.0000 mg | DELAYED_RELEASE_TABLET | Freq: Two times a day (BID) | ORAL | Status: DC
  Administered 2017-09-26 – 2017-09-27 (×2): 40 mg via ORAL
  Filled 2017-09-26 (×2): qty 1

## 2017-09-26 NOTE — Progress Notes (Addendum)
PROGRESS NOTE    Ian Weber  YIF:[id number] DOB: 1933-11-29 DOA: 09/24/2017 PCP: Jilda Panda, MD    Brief Narrative: Ian Weber is a 83 y.o. male with medical history significant of diverticulosis, hypertension, post thyroid cancer surgical hypothyroidism, GERD, anxiety, esophageal stricture, IBS, who presents with rectal bleeding.    Assessment & Plan:   Principal Problem:   GIB (gastrointestinal bleeding) Active Problems:   HYPOTHYROIDISM, POSTSURGICAL   Essential hypertension   Diverticulosis of colon with hemorrhage   Anxiety    Rectal bleeding Secondary to diverticulosis of sigmoid colon Patient underwent colonoscopy by Dr.  Carlean Purl, recommended watching the patient overnight and if stable advance diet in the morning and possible discharge home tomorrow. Status post PRBC transfusion and repeat hemoglobin is stable in the morning around 9   Hypothyroidism resume Synthroid   Hypertension Well-controlled resume Bystolic.   Paroxysmal atrial fibrillation Continue with Bystolic for rate control Off anticoagulation due to bleeding.   Right inguinal hernia which is reducible   Questionable presyncope Possibly secondary to dehydration he underwent PRBC transfusion and he worked with physical therapy.  Anemia from GI bleed Status post PRBC transfusion.  Hemoglobin stable around 9.  DVT prophylaxis: SCDs Code Status: Full code Family Communication: Discussed with family at bedside Disposition Plan: Possible discharge home tomorrow   Consultants:   Strength neurology   Procedures: colonoscopy.    Antimicrobials: none.    Subjective: No new complaints.   Objective: Vitals:   09/25/17 2337 09/26/17 0315 09/26/17 0600 09/26/17 0900  BP: (!) 104/55 (!) 113/57 124/81 (!) 141/67  Pulse: 61 (!) 52 91 65  Resp: 16 16 16    Temp: 97.9 F (36.6 C) 98 F (36.7 C) 98.3 F (36.8 C)   TempSrc: Oral Oral Axillary   SpO2: 100% 100% 98% 100%  Weight:       Height:        Intake/Output Summary (Last 24 hours) at 09/26/2017 1808 Last data filed at 09/26/2017 0900 Gross per 24 hour  Intake 1626 ml  Output 250 ml  Net 1376 ml   Filed Weights   09/24/17 0502 09/25/17 1346  Weight: 86.2 kg (190 lb) 86.2 kg (190 lb)    Examination:  General exam: Appears calm and comfortable  Respiratory system: Clear to auscultation. Respiratory effort normal. Cardiovascular system: S1 & S2 heard, RRR. No JVD, murmurs, rubs, gallops or clicks. No pedal edema. Gastrointestinal system: Abdomen is nondistended, soft and nontender. No organomegaly or masses felt. Normal bowel sounds heard. Central nervous system: Alert and oriented. No focal neurological deficits. Extremities: Symmetric 5 x 5 power. Skin: No rashes, lesions or ulcers Psychiatry: Judgement and insight appear normal. Mood & affect appropriate.     Data Reviewed: I have personally reviewed following labs and imaging studies  CBC: Recent Labs  Lab 09/24/17 0326 09/24/17 1703 09/24/17 2337 09/25/17 0042 09/25/17 0825 09/26/17 0557 09/26/17 1204  WBC 4.4 7.0 6.0  --  5.6 6.3  --   NEUTROABS  --   --   --   --   --  3.6  --   HGB 11.8* 11.2* 10.1* 8.9* 7.7* 9.0* 9.8*  HCT 35.4* 34.0* 31.0* 25.9* 22.8* 25.9* 28.6*  MCV 93.2 93.7 93.4  --  92.3 91.5  --   PLT 173 157 147*  --  126* 113*  --    Basic Metabolic Panel: Recent Labs  Lab 09/24/17 0326 09/25/17 0042 09/26/17 0557  NA 141 142 141  K 3.9  3.9 4.0  CL 110 112* 113*  CO2 26 25 25   GLUCOSE 125* 129* 88  BUN 16 14 10   CREATININE 0.74 0.62 0.59*  CALCIUM 8.6* 7.8* 7.8*  MG  --  1.6* 1.9   GFR: Estimated Creatinine Clearance: 79.5 mL/min (A) (by C-G formula based on SCr of 0.59 mg/dL (L)). Liver Function Tests: Recent Labs  Lab 09/24/17 0326  AST 18  ALT 14*  ALKPHOS 55  BILITOT 0.7  PROT 6.1*  ALBUMIN 3.5   No results for input(s): LIPASE, AMYLASE in the last 168 hours. No results for input(s): AMMONIA  in the last 168 hours. Coagulation Profile: Recent Labs  Lab 09/24/17 0326  INR 1.14   Cardiac Enzymes: No results for input(s): CKTOTAL, CKMB, CKMBINDEX, TROPONINI in the last 168 hours. BNP (last 3 results) No results for input(s): PROBNP in the last 8760 hours. HbA1C: No results for input(s): HGBA1C in the last 72 hours. CBG: Recent Labs  Lab 09/24/17 0830 09/25/17 0820 09/26/17 0757  GLUCAP 94 100* 86   Lipid Profile: No results for input(s): CHOL, HDL, LDLCALC, TRIG, CHOLHDL, LDLDIRECT in the last 72 hours. Thyroid Function Tests: Recent Labs    09/24/17 0653  TSH 0.905   Anemia Panel: No results for input(s): VITAMINB12, FOLATE, FERRITIN, TIBC, IRON, RETICCTPCT in the last 72 hours. Sepsis Labs: No results for input(s): PROCALCITON, LATICACIDVEN in the last 168 hours.  No results found for this or any previous visit (from the past 240 hour(s)).       Radiology Studies: No results found.      Scheduled Meds: . feeding supplement  1 Container Oral Q24H  . levothyroxine  100 mcg Oral QAC breakfast  . nebivolol  2.5 mg Oral Daily  . pantoprazole  40 mg Oral BID   Continuous Infusions: . sodium chloride 50 mL/hr at 09/25/17 0952     LOS: 2 days    Time spent: 35 minutes.     Hosie Poisson, MD [facility] Pager [phone]   If 7PM-7AM, please contact night-coverage [email or web address] Password [email or web address] 09/26/2017, 6:08 PM

## 2017-09-26 NOTE — Progress Notes (Signed)
     [facility] Progress Note  Chief Complaint:    Rectal bleeding  Subjective: No complaints. He wants to have bathroom privileges and ambulate. No BMs nor bleeding since colonoscopy   Objective:  Vital signs in last 24 hours: Temp:  [97.4 F (36.3 C)-98.3 F (36.8 C)] 98.3 F (36.8 C) (12/19 0600) Pulse Rate:  [52-91] 91 (12/19 0600) Resp:  [9-18] 16 (12/19 0600) BP: (98-140)/(36-81) 124/81 (12/19 0600) SpO2:  [98 %-100 %] 98 % (12/19 0600) Weight:  [190 lb (86.2 kg)] 190 lb (86.2 kg) (12/18 1346) Last BM Date: 09/25/17 General:   Alert, well-developed,  Black male in NAD EENT:  Normal hearing, non icteric sclera, conjunctive pink.  Heart:  Regular rate and rhythm; trace BLE edema Pulm: Normal respiratory effort Abdomen:  Soft, nondistended, nontender.  Normal bowel sounds, no masses felt. No hepatomegaly.    Neurologic:  Alert and  oriented x4;  grossly normal neurologically. Psych:  Pleasant, cooperative.  Normal mood and affect.  Lab Results: Recent Labs    09/24/17 2337 09/25/17 0042 09/25/17 0825 09/26/17 0557  WBC 6.0  --  5.6 6.3  HGB 10.1* 8.9* 7.7* 9.0*  HCT 31.0* 25.9* 22.8* 25.9*  PLT 147*  --  126* 113*    ASSESSMENT / PLAN:   1. 83 yo male with painless hematochezia. Colonoscopy yesterday >> Blood throughout colon, unable to visualize a source. Suspect diverticular bleed. No further bleeding since colonoscopy.  -continue to watch closely for next 48 hours -maybe ambulate with assistance later today since he feels more stable after transfusion -if no further bleeding today then advance diet tomorrow.  2. ABL anemia. Hgb 7.7 yesterday. Given two units, hgb 9.0 now   LOS: 2 days   Tye Savoy ,NP 09/26/2017, 9:17 AM  Pager number [phone]    [facility] Attending   I have taken an interval history, reviewed the chart and examined the patient. I agree with the Advanced Practitioner's note, impression and recommendations.    Bleeding appears to be stopping. Dx is diverticulosis w/ hemorrhage. Improved. Observe at least until tomorrow and consider dc  Gatha Mayer, MD, [facility] [phone] (pager) 09/26/2017 1:15 PM

## 2017-09-26 NOTE — Evaluation (Signed)
Physical Therapy Evaluation Patient Details Name: Ian Weber MRN: [id number] DOB: 03/16/1934 Today's Date: 09/26/2017   History of Present Illness  83 yo male admitted with GI bleed, anemia. Hx of PAF, bil LE edema  Clinical Impression  On eval, pt was Min guard assist for mobility. He walked ~1200 feet around unit. Unsteady at times but no overt LOB noted. Pt tolerated distance well. Recommend daily ambulation in hallway with nursing supervision. Will follow.      Follow Up Recommendations No PT follow up;Supervision for mobility/OOB(until pt returns to baseline)    Equipment Recommendations  None recommended by PT    Recommendations for Other Services       Precautions / Restrictions Precautions Precautions: Fall Restrictions Weight Bearing Restrictions: No      Mobility  Bed Mobility               General bed mobility comments: oob in recliner  Transfers Overall transfer level: Needs assistance   Transfers: Sit to/from Stand           General transfer comment: close guard for safety. Increased time.   Ambulation/Gait Ambulation/Gait assistance: Min guard Ambulation Distance (Feet): 1200 Feet Assistive device: None Gait Pattern/deviations: Step-through pattern;Drifts right/left;Staggering right     General Gait Details: Intermittent unstaediness but no LOB.   Stairs            Wheelchair Mobility    Modified Rankin (Stroke Patients Only)       Balance Overall balance assessment: Needs assistance           Standing balance-Leahy Scale: Good                               Pertinent Vitals/Pain Pain Assessment: No/denies pain    Home Living Family/patient expects to be discharged to:: Private residence Living Arrangements: Spouse/significant other Available Help at Discharge: Family Type of Home: House Home Access: Stairs to enter     Home Layout: One level Home Equipment: None      Prior Function Level of  Independence: Independent               Hand Dominance        Extremity/Trunk Assessment   Upper Extremity Assessment Upper Extremity Assessment: Overall WFL for tasks assessed    Lower Extremity Assessment Lower Extremity Assessment: Generalized weakness    Cervical / Trunk Assessment Cervical / Trunk Assessment: Normal  Communication   Communication: No difficulties  Cognition Arousal/Alertness: Awake/alert Behavior During Therapy: WFL for tasks assessed/performed Overall Cognitive Status: Within Functional Limits for tasks assessed                                        General Comments      Exercises     Assessment/Plan    PT Assessment Patient needs continued PT services  PT Problem List Decreased mobility       PT Treatment Interventions Gait training;Functional mobility training;Therapeutic activities;Therapeutic exercise    PT Goals (Current goals can be found in the Care Plan section)  Acute Rehab PT Goals Patient Stated Goal: to move around PT Goal Formulation: With patient/family Time For Goal Achievement: 10/10/17 Potential to Achieve Goals: Good    Frequency Min 2X/week   Barriers to discharge        Co-evaluation  AM-PAC PT "6 Clicks" Daily Activity  Outcome Measure Difficulty turning over in bed (including adjusting bedclothes, sheets and blankets)?: None Difficulty moving from lying on back to sitting on the side of the bed? : None Difficulty sitting down on and standing up from a chair with arms (e.g., wheelchair, bedside commode, etc,.)?: A Little Help needed moving to and from a bed to chair (including a wheelchair)?: A Little Help needed walking in hospital room?: A Little Help needed climbing 3-5 steps with a railing? : A Little 6 Click Score: 20    End of Session Equipment Utilized During Treatment: Gait belt Activity Tolerance: Patient tolerated treatment well Patient left: in  chair;with call bell/phone within reach;with family/visitor present   PT Visit Diagnosis: Other abnormalities of gait and mobility (R26.89)    Time: 1445-1501 PT Time Calculation (min) (ACUTE ONLY): 16 min   Charges:   PT Evaluation $PT Eval Low Complexity: 1 Low     PT G Codes:          Weston Anna, MPT Pager: [phone]

## 2017-09-26 NOTE — Progress Notes (Signed)
Initial Nutrition Assessment  DOCUMENTATION CODES:   Not applicable  INTERVENTION:  - Will order Boost Breeze once/day, this supplement provides 250 kcal and 9 grams of protein - Diet advancement as medically feasible.  NUTRITION DIAGNOSIS:   Inadequate protein intake related to other (see comment)(current diet order) as evidenced by other (comment)(CLD does not meet estimated protein need).  GOAL:   Patient will meet greater than or equal to 90% of their needs  MONITOR:   PO intake, Supplement acceptance, Diet advancement, Weight trends, Labs  REASON FOR ASSESSMENT:   Consult Assessment of nutrition requirement/status  ASSESSMENT:   83 y.o. male with medical history significant of diverticulosis, hypertension, post thyroid cancer surgical hypothyroidism, GERD, anxiety, esophageal stricture, IBS, who presents with rectal bleeding.  BMI indicates overweight status (appropriate for age). Pt on CLD and consumed 100% of breakfast this AM without issue. He denies abdominal pain or N/V since admission or PTA. He denies any changes in intakes or appetites PTA. He had colonoscopy yesterday afternoon. This procedure showed diverticulosis in the colon which may have been the source of bleeding with bleeding found in the entirety of the colon. GI NP note from this AM states possible diet advancement tomorrow if no additional bleeding occurs. Per chart review, weight has been stable (185-190 lbs) since 08/08/12.  Medications reviewed; 100 mcg oral Synthroid/day, 40 mg oral Protonix BID. Labs reviewed; Cl: 113 mmol/L, creatinine: 0.59 mg/dL, Ca: 7.8 mg/dL. IVF: NS @ 50 mL/hr.     NUTRITION - FOCUSED PHYSICAL EXAM:  Completed; no muscle wasting and no fat wasting noted.   Diet Order:  Diet clear liquid Room service appropriate? Yes; Fluid consistency: Thin  EDUCATION NEEDS:   No education needs have been identified at this time  Skin:  Skin Assessment: Reviewed RN  Assessment  Last BM:  12/18  Height:   Ht Readings from Last 1 Encounters:  09/25/17 6' (1.829 m)    Weight:   Wt Readings from Last 1 Encounters:  09/25/17 190 lb (86.2 kg)    Ideal Body Weight:  80.91 kg  BMI:  Body mass index is 25.77 kg/m.  Estimated Nutritional Needs:   Kcal:  1450-1735 (25-30 kcal/kg)  Protein:  60-70 grams  Fluid:  >/= 1.8 L/day     Jarome Matin, MS, RD, LDN, [facility] Clinical Dietitian Pager # [phone] After hours/weekend pager # [phone]

## 2017-09-27 LAB — GLUCOSE, CAPILLARY: Glucose-Capillary: 80 mg/dL (ref 65–99)

## 2017-09-27 LAB — HEMOGLOBIN AND HEMATOCRIT, BLOOD
HCT: 29.8 % — ABNORMAL LOW (ref 39.0–52.0)
Hemoglobin: 10.1 g/dL — ABNORMAL LOW (ref 13.0–17.0)

## 2017-09-27 MED ORDER — PANTOPRAZOLE SODIUM 40 MG PO TBEC: 40.0000 mg | DELAYED_RELEASE_TABLET | Freq: Two times a day (BID) | ORAL | 0 refills | Status: DC

## 2017-09-27 MED ORDER — ASPIRIN 81 MG PO TABS: 81.0000 mg | ORAL_TABLET | Freq: Every day | ORAL | Status: DC

## 2017-09-27 NOTE — Progress Notes (Signed)
Physical Therapy Treatment and Discharge Note Patient Details Name: LEALAND ELTING MRN: [id number] DOB: Feb 24, 1934 Today's Date: 09/27/2017    History of Present Illness 83 yo male admitted with GI bleed, anemia. Hx of PAF, bil LE edema    PT Comments    Pt continues to mobilize well. No lightheadedness/dizzinesss with activity. No acute PT needs at this time. Will sign off.    Follow Up Recommendations  No PT follow up     Equipment Recommendations  None recommended by PT    Recommendations for Other Services       Precautions / Restrictions Precautions Precautions: Fall Restrictions Weight Bearing Restrictions: No    Mobility  Bed Mobility Overal bed mobility: Independent                Transfers Overall transfer level: Modified independent                  Ambulation/Gait Ambulation/Gait assistance: Modified independent (Device/Increase time) Ambulation Distance (Feet): 1200 Feet Assistive device: None Gait Pattern/deviations: WFL(Within Functional Limits)   Gait velocity interpretation: at or above normal speed for age/gender     Stairs            Wheelchair Mobility    Modified Rankin (Stroke Patients Only)       Balance Overall balance assessment: No apparent balance deficits (not formally assessed)                                          Cognition Arousal/Alertness: Awake/alert Behavior During Therapy: WFL for tasks assessed/performed Overall Cognitive Status: Within Functional Limits for tasks assessed                                        Exercises      General Comments        Pertinent Vitals/Pain Pain Assessment: No/denies pain    Home Living                      Prior Function            PT Goals (current goals can now be found in the care plan section) Progress towards PT goals: Goals met/education completed, patient discharged from PT    Frequency    Min 2X/week      PT Plan Current plan remains appropriate    Co-evaluation              AM-PAC PT "6 Clicks" Daily Activity  Outcome Measure  Difficulty turning over in bed (including adjusting bedclothes, sheets and blankets)?: None Difficulty moving from lying on back to sitting on the side of the bed? : None Difficulty sitting down on and standing up from a chair with arms (e.g., wheelchair, bedside commode, etc,.)?: None Help needed moving to and from a bed to chair (including a wheelchair)?: None Help needed walking in hospital room?: None Help needed climbing 3-5 steps with a railing? : None 6 Click Score: 24    End of Session   Activity Tolerance: Patient tolerated treatment well Patient left: in bed;with call bell/phone within reach;with family/visitor present         Time: 0922-0933 PT Time Calculation (min) (ACUTE ONLY): 11 min  Charges:  $Gait Training: 8-22 mins  G Codes:          Weston Anna, MPT Pager: [phone]

## 2017-09-27 NOTE — Progress Notes (Signed)
Date: September 27, 2017 Chart review for discharge needs:  None found for case management. Patient has no questions concerning post hospital care.

## 2017-09-27 NOTE — Care Management Important Message (Signed)
Important Message  Patient Details  Name: Ian Weber MRN: [id number] Date of Birth: Dec 24, 1933   Medicare Important Message Given:  Yes    Kerin Salen 09/27/2017, 10:02 AMImportant Message  Patient Details  Name: Ian Weber MRN: [id number] Date of Birth: Oct 04, 1934   Medicare Important Message Given:  Yes    Kerin Salen 09/27/2017, 10:02 AM

## 2017-09-27 NOTE — Progress Notes (Signed)
     [facility] Progress Note  Chief Complaint:    Rectal bleeding  Subjective: feels fine. One BM yesterday morning had small amount of blood in it. Second BM yesterday had no blood. No BMs / bleeding so far today. He wants to go home  Objective:  Vital signs in last 24 hours: Temp:  [98.2 F (36.8 C)-98.4 F (36.9 C)] 98.2 F (36.8 C) (12/20 0437) Pulse Rate:  [52-54] 52 (12/20 0437) Resp:  [16-18] 16 (12/20 0437) BP: (124-127)/(67-72) 127/72 (12/20 0437) SpO2:  [100 %] 100 % (12/20 0437) Last BM Date: 09/25/17 General:   Alert, well-developed, black male in  NAD EENT:  Normal hearing, non icteric sclera, conjunctive pink.  Heart:  1+ BLE edema Pulm: Normal respiratory effort, lungs CTA bilaterally without wheezes or crackles. Abdomen:  Soft, nondistended, nontender.  Normal bowel sounds, no masses felt. No hepatomegaly.    Neurologic:  Alert and  oriented x4;  grossly normal neurologically. Psych:  Pleasant, cooperative.  Normal mood and affect.   Lab Results: Recent Labs    09/24/17 2337  09/25/17 0825 09/26/17 0557 09/26/17 1204 09/26/17 2339 09/27/17 1004  WBC 6.0  --  5.6 6.3  --   --   --   HGB 10.1*   < > 7.7* 9.0* 9.8* 9.1* 10.1*  HCT 31.0*   < > 22.8* 25.9* 28.6* 26.2* 29.8*  PLT 147*  --  126* 113*  --   --   --    < > = values in this interval not displayed.   BMET Recent Labs    09/25/17 0042 09/26/17 0557  NA 142 141  K 3.9 4.0  CL 112* 113*  CO2 25 25  GLUCOSE 129* 88  BUN 14 10  CREATININE 0.62 0.59*  CALCIUM 7.8* 7.8*   ASSESSMENT / PLAN:    83 yo male with painless hematochezia  ABL anemia. Blood throughout colon on colonoscopy so couldn't visualize source but diverticular hemorrhage suspected. No bleeding since yesterday morning and had Bm without blood subsequent to that. Hgb up to 10.1 today, he is s/p 2 units of blood on 12/18 -He really wants to go home. Should be stable for discharge from GI standpoint.     Discharge Planning Diet: no GI restrictions Anticoagulation and antiplatelets: N/A Discharge Medications: No changes    LOS: 3 days   Ian Weber ,NP 09/27/2017, 9:35 AM  Pager number [phone]    [facility] Attending   I have taken an interval history, reviewed the chart and examined the patient. I agree with the Advanced Practitioner's note, impression and recommendations.   I did go ahead and make a f/u with Dr. Loletha Carrow his primary GI for constipation (and the diverticular bleed)  Will show up on list at dc 1/25  He should resume his outpatient GI meds at dc  See PCP in 1-2 weeks after dc  Thanks  Ian Mayer, MD, [facility] [phone] (pager) 09/27/2017 1:08 PM

## 2017-09-28 ENCOUNTER — Telehealth: Payer: Self-pay | Admitting: Gastroenterology

## 2017-09-28 ENCOUNTER — Other Ambulatory Visit: Payer: Self-pay | Admitting: Gastroenterology

## 2017-09-28 NOTE — Telephone Encounter (Signed)
Patient states he was in hosp yesterday and had procedure done with Dr.Gessner. Pt was prescribed pantoprazole in hosp but states he thinks he is having side affects from it. Pt wants to know if he can try something else. Dr.Danis pt.

## 2017-09-28 NOTE — Telephone Encounter (Signed)
Left message for pt to call back  °

## 2017-09-29 NOTE — Discharge Summary (Signed)
Physician Discharge Summary  INDIE NICKERSON VWU:[id number] DOB: [birth year] DOA: 09/24/2017  PCP: Jilda Panda, MD  Admit date: 09/24/2017 Discharge date: 09/27/2017  Admitted From: Home.  Disposition:  Home.   Recommendations for Outpatient Follow-up:  1. Follow up with PCP in 1-2 weeks 2. Please obtain BMP/CBC in one week Please follow up with GI as recommended in 1 to 2 weeks. Marland Kitchen   Discharge Condition:stable.  CODE STATUS: full code.  Diet recommendation: Heart Healthy   Brief/Interim Summary: Ian Burgueno McInnisis a 83 y.o.malewith medical history significant ofdiverticulosis, hypertension, post thyroid cancer surgical hypothyroidism, GERD, anxiety, esophageal stricture, IBS, who presents with rectal bleeding.    Discharge Diagnoses:  Principal Problem:   GIB (gastrointestinal bleeding) Active Problems:   HYPOTHYROIDISM, POSTSURGICAL   Essential hypertension   Diverticulosis of colon with hemorrhage   Anxiety  Rectal bleeding Secondary to diverticulosis of sigmoid colon Patient underwent colonoscopy by Dr.  Carlean Purl,  Started on clear liquid diet and advanced diet to soft diet .  Status post PRBC transfusion and repeat hemoglobin is stable in the morning around 9   Hypothyroidism resume Synthroid   Hypertension Well-controlled resume Bystolic.   Paroxysmal atrial fibrillation Continue with Bystolic for rate control Off anticoagulation due to bleeding.   Right inguinal hernia which is reducible   Questionable presyncope Possibly secondary to dehydration he underwent PRBC transfusion and he worked with physical therapy without any symptoms. .  Anemia from GI bleed Status post PRBC transfusion.  Hemoglobin stable around 9.    Discharge Instructions  Discharge Instructions    Diet - low sodium heart healthy   Complete by:  As directed    Discharge instructions   Complete by:  As directed    Please follow up with PCP ino ne week.  Please  follow up with Gastroenterology as recommended in 1 to 2 weeks.     Allergies as of 09/27/2017      Reactions   Amlodipine    Chlorthalidone    Codeine Phosphate    REACTION: weakness, sweating   Hydrochlorothiazide W-triamterene    Prednisone    REACTION: unspecified   Sulfamethoxazole    REACTION: itching   Triamterene       Medication List    TAKE these medications   ALPRAZolam 0.25 MG tablet Commonly known as:  XANAX Take 0.25 mg by mouth at bedtime as needed for anxiety or sleep.   aspirin 81 MG tablet Take 1 tablet (81 mg total) by mouth daily.   levothyroxine 100 MCG tablet Commonly known as:  SYNTHROID, LEVOTHROID Take 100 mcg by mouth daily.   losartan 50 MG tablet Commonly known as:  COZAAR Take 50 mg by mouth daily.   nebivolol 2.5 MG tablet Commonly known as:  BYSTOLIC Take 2.5 mg by mouth daily.   pantoprazole 40 MG tablet Commonly known as:  PROTONIX Take 1 tablet (40 mg total) by mouth 2 (two) times daily.      Follow-up Information    Jilda Panda, MD. Schedule an appointment as soon as possible for a visit in 1 week(s).   Specialty:  Internal Medicine Contact information: [address] [place] [address] [phone]        Gatha Mayer, MD Follow up in 2 week(s).   Specialty:  Gastroenterology Why:  as needed.  Contact information: [address] [place] [address] [phone]          Allergies  Allergen Reactions  . Amlodipine   .  Chlorthalidone   . Codeine Phosphate     REACTION: weakness, sweating  . Hydrochlorothiazide W-Triamterene   . Prednisone     REACTION: unspecified  . Sulfamethoxazole     REACTION: itching  . Triamterene     Consultations:  GASTROENTEROLOGY.    Procedures/Studies:  No results found.  EGD.    Subjective: No new complaints.   Discharge Exam: Vitals:   09/27/17 0437 09/27/17 1349  BP: 127/72 (!) 117/59  Pulse: (!) 52 (!) 58  Resp: 16 18  Temp: 98.2 F (36.8  C) (!) 97.5 F (36.4 C)  SpO2: 100%    Vitals:   09/26/17 0900 09/26/17 2103 09/27/17 0437 09/27/17 1349  BP: (!) 141/67 124/67 127/72 (!) 117/59  Pulse: 65 (!) 54 (!) 52 (!) 58  Resp:  18 16 18   Temp:  98.4 F (36.9 C) 98.2 F (36.8 C) (!) 97.5 F (36.4 C)  TempSrc:  Oral Oral Oral  SpO2: 100% 100% 100%   Weight:      Height:        General: Pt is alert, awake, not in acute distress Cardiovascular: RRR, S1/S2 +, no rubs, no gallops Respiratory: CTA bilaterally, no wheezing, no rhonchi Abdominal: Soft, NT, ND, bowel sounds + Extremities: no edema, no cyanosis    The results of significant diagnostics from this hospitalization (including imaging, microbiology, ancillary and laboratory) are listed below for reference.     Microbiology: No results found for this or any previous visit (from the past 240 hour(s)).   Labs: BNP (last 3 results) No results for input(s): BNP in the last 8760 hours. Basic Metabolic Panel: Recent Labs  Lab 09/24/17 0326 09/25/17 0042 09/26/17 0557  NA 141 142 141  K 3.9 3.9 4.0  CL 110 112* 113*  CO2 26 25 25   GLUCOSE 125* 129* 88  BUN 16 14 10   CREATININE 0.74 0.62 0.59*  CALCIUM 8.6* 7.8* 7.8*  MG  --  1.6* 1.9   Liver Function Tests: Recent Labs  Lab 09/24/17 0326  AST 18  ALT 14*  ALKPHOS 55  BILITOT 0.7  PROT 6.1*  ALBUMIN 3.5   No results for input(s): LIPASE, AMYLASE in the last 168 hours. No results for input(s): AMMONIA in the last 168 hours. CBC: Recent Labs  Lab 09/24/17 0326 09/24/17 1703 09/24/17 2337  09/25/17 0825 09/26/17 0557 09/26/17 1204 09/26/17 2339 09/27/17 1004  WBC 4.4 7.0 6.0  --  5.6 6.3  --   --   --   NEUTROABS  --   --   --   --   --  3.6  --   --   --   HGB 11.8* 11.2* 10.1*   < > 7.7* 9.0* 9.8* 9.1* 10.1*  HCT 35.4* 34.0* 31.0*   < > 22.8* 25.9* 28.6* 26.2* 29.8*  MCV 93.2 93.7 93.4  --  92.3 91.5  --   --   --   PLT 173 157 147*  --  126* 113*  --   --   --    < > = values in this  interval not displayed.   Cardiac Enzymes: No results for input(s): CKTOTAL, CKMB, CKMBINDEX, TROPONINI in the last 168 hours. BNP: Invalid input(s): POCBNP CBG: Recent Labs  Lab 09/24/17 0830 09/25/17 0820 09/26/17 0757 09/27/17 0717  GLUCAP 94 100* 86 80   D-Dimer No results for input(s): DDIMER in the last 72 hours. Hgb A1c No results for input(s): HGBA1C in the last 72  hours. Lipid Profile No results for input(s): CHOL, HDL, LDLCALC, TRIG, CHOLHDL, LDLDIRECT in the last 72 hours. Thyroid function studies No results for input(s): TSH, T4TOTAL, T3FREE, THYROIDAB in the last 72 hours.  Invalid input(s): FREET3 Anemia work up No results for input(s): VITAMINB12, FOLATE, FERRITIN, TIBC, IRON, RETICCTPCT in the last 72 hours. Urinalysis    Component Value Date/Time   COLORURINE STRAW (A) 11/19/2012 1138   APPEARANCEUR CLEAR 11/19/2012 1138   LABSPEC 1.006 11/19/2012 1138   PHURINE 7.5 11/19/2012 1138   GLUCOSEU NEGATIVE 11/19/2012 1138   HGBUR TRACE (A) 11/19/2012 1138   BILIRUBINUR NEGATIVE 11/19/2012 1138   KETONESUR NEGATIVE 11/19/2012 1138   PROTEINUR NEGATIVE 11/19/2012 1138   UROBILINOGEN 0.2 11/19/2012 1138   NITRITE NEGATIVE 11/19/2012 1138   LEUKOCYTESUR NEGATIVE 11/19/2012 1138   Sepsis Labs Invalid input(s): PROCALCITONIN,  WBC,  LACTICIDVEN Microbiology No results found for this or any previous visit (from the past 240 hour(s)).   Time coordinating discharge: Over 30 minutes  SIGNED:   Hosie Poisson, MD  [facility] 09/29/2017, 11:56 PM Pager   If 7PM-7AM, please contact night-coverage [email or web address] Password [email or web address]

## 2017-10-04 ENCOUNTER — Emergency Department (HOSPITAL_COMMUNITY)
Admission: EM | Admit: 2017-10-04 | Discharge: 2017-10-04 | Disposition: A | Discharge status: Home / Self Care | Payer: Medicare HMO | Attending: Emergency Medicine | Admitting: Emergency Medicine

## 2017-10-04 ENCOUNTER — Encounter (HOSPITAL_COMMUNITY): Payer: Self-pay | Admitting: *Deleted

## 2017-10-04 ENCOUNTER — Other Ambulatory Visit: Payer: Self-pay

## 2017-10-04 DIAGNOSIS — Z79899 Other long term (current) drug therapy: Secondary | ICD-10-CM | POA: Insufficient documentation

## 2017-10-04 DIAGNOSIS — K5909 Other constipation: Secondary | ICD-10-CM | POA: Insufficient documentation

## 2017-10-04 DIAGNOSIS — I10 Essential (primary) hypertension: Secondary | ICD-10-CM | POA: Insufficient documentation

## 2017-10-04 DIAGNOSIS — K625 Hemorrhage of anus and rectum: Secondary | ICD-10-CM | POA: Diagnosis not present

## 2017-10-04 DIAGNOSIS — E039 Hypothyroidism, unspecified: Secondary | ICD-10-CM | POA: Insufficient documentation

## 2017-10-04 LAB — COMPREHENSIVE METABOLIC PANEL
ALT: 13 U/L — ABNORMAL LOW (ref 17–63)
ANION GAP: 8 (ref 5–15)
AST: 19 U/L (ref 15–41)
Albumin: 3.3 g/dL — ABNORMAL LOW (ref 3.5–5.0)
Alkaline Phosphatase: 56 U/L (ref 38–126)
BUN: 10 mg/dL (ref 6–20)
CHLORIDE: 106 mmol/L (ref 101–111)
CO2: 24 mmol/L (ref 22–32)
Calcium: 8.4 mg/dL — ABNORMAL LOW (ref 8.9–10.3)
Creatinine, Ser: 0.79 mg/dL (ref 0.61–1.24)
GFR calc non Af Amer: >60 mL/min (ref >60)
Glucose, Bld: 94 mg/dL (ref 65–99)
Potassium: 4.6 mmol/L (ref 3.5–5.1)
SODIUM: 138 mmol/L (ref 135–145)
Total Bilirubin: 0.6 mg/dL (ref 0.3–1.2)
Total Protein: 5.7 g/dL — ABNORMAL LOW (ref 6.5–8.1)

## 2017-10-04 LAB — CBC WITH DIFFERENTIAL/PLATELET
Basophils Absolute: 0 10*3/uL (ref 0.0–0.1)
Basophils Relative: 0 %
EOS ABS: 0 10*3/uL (ref 0.0–0.7)
EOS PCT: 0 %
HCT: 28.4 % — ABNORMAL LOW (ref 39.0–52.0)
Hemoglobin: 9.3 g/dL — ABNORMAL LOW (ref 13.0–17.0)
LYMPHS ABS: 1.1 10*3/uL (ref 0.7–4.0)
Lymphocytes Relative: 19 %
MCH: 31 pg (ref 26.0–34.0)
MCHC: 32.7 g/dL (ref 30.0–36.0)
MCV: 94.7 fL (ref 78.0–100.0)
MONOS PCT: 6 %
Monocytes Absolute: 0.4 10*3/uL (ref 0.1–1.0)
Neutro Abs: 4.3 10*3/uL (ref 1.7–7.7)
Neutrophils Relative %: 75 %
PLATELETS: 233 10*3/uL (ref 150–400)
RBC: 3 MIL/uL — ABNORMAL LOW (ref 4.22–5.81)
RDW: 14.3 % (ref 11.5–15.5)
WBC: 5.8 10*3/uL (ref 4.0–10.5)

## 2017-10-04 LAB — POC OCCULT BLOOD, ED: Fecal Occult Bld: POSITIVE — AB

## 2017-10-04 MED ORDER — FERROUS SULFATE 325 (65 FE) MG PO TABS: 325.0000 mg | ORAL_TABLET | Freq: Every day | ORAL | 0 refills | Status: DC

## 2017-10-04 NOTE — Telephone Encounter (Signed)
Yes - agree - he had a presumed diverticular bleed though other lesions could have been missed

## 2017-10-04 NOTE — Telephone Encounter (Signed)
Record review shows he was admitted last week for diverticular bleeding and underwent colonoscopy with Dr. Carlean Purl.  If he is bleeding again, he must return to the hospital now through the ED.  I would prefer if her went to [facility], since that is where I am this week.

## 2017-10-04 NOTE — Telephone Encounter (Signed)
Spoke to patient's wife, patient had 2 episodes of bright red blood in stool this morning, bleeding stops after using tissue. Patient states he is "a little bit dizzy", denies any SOB. He also experienced elevated heart rate when he was taking the pantoprazole, he stopped taking this and has not had any further issues. Please advise.

## 2017-10-04 NOTE — Telephone Encounter (Signed)
Left detailed message for patient, asked them to go to [facility] ED. Will try back later to see if I can reach someone.

## 2017-10-04 NOTE — ED Provider Notes (Signed)
  Face-to-face evaluation   History: He presents for evaluation of rectal bleeding, x2 in the last 24 hours.  He was supposed to see his PCP today, but chose to come here for evaluation.  Physical exam: Alert elderly man who is comfortable.  He is conversant.  He is here with a family member.  There is no respiratory distress.  The abdomen is soft and nontender.  Medical screening examination/treatment/procedure(s) were conducted as a shared visit with non-physician practitioner(s) and myself.  I personally evaluated the patient during the encounter    Daleen Bo, MD 10/05/17 [phone]

## 2017-10-04 NOTE — Discharge Instructions (Signed)
You were seen in the emergency department for concern of rectal bleeding this morning.  Your hemoglobin was 9.3, this is stable from the last time it was checked during your hospital stay.  You do not require a blood transfusion today.  You will need to follow-up with Dr. Mellody Drown tomorrow as scheduled for re-evaluation.   We have started you on an iron supplement.  Discuss this with Dr. Mellody Drown to determine whether or not this is something he would like you to be taking long-term. Take this at night before bed to avoid interactions with your Synthroid.   We also discussed your medication list today.  As discussed take your Synthroid first thing in the morning when you wake up. Wait at least 1 hour prior to eating or taking any other medications after you take your Synthroid.   Take your Losartan, Aspirin, and Nebivolol daily- these can be taken 1 hour following your Synthroid at any time throughout the day. Try to take these medicines at the same time each day to ensure consistency.   You explained to me that the Protonix you're prescribed was causing side effects, be sure to mention this at your appointment tomorrow to see if another medication should be initiated to replace this.   Follow-up tomorrow as discussed.  Return to the emergency department for new or worsening symptoms including but not limited to lightheadedness, dizziness, passing out, chest pain, difficulty breathing, uncontrollable bleeding, or any other concerns.

## 2017-10-04 NOTE — ED Provider Notes (Signed)
Patient placed in Quick Look pathway, seen and evaluated for chief complaint of rectal bleeding, discharged on 12/20 after diverticular bleed and required transfusion..  Pertinent H&P findings include  2 episodes of BRPR BMs this morning , no blood thinners, no weakness or sob.  Based on initial evaluation, labs are indicated and radiology studies are not  indicated.  Patient counseled on process, plan, and necessity for staying for completing the evaluation   Margarita Mail, PA-C 10/04/17 1348    Pattricia Boss, MD 10/04/17 1354

## 2017-10-04 NOTE — Telephone Encounter (Signed)
Pt wife returning call from Friday and states pt is bleeding again.

## 2017-10-04 NOTE — ED Triage Notes (Signed)
Pt reports rectal bleeding that started last night. Pt states that he had a bowel movements and noticed bright red bleeding. Pt was seen for same recently and had to have a blood transfusion

## 2017-10-04 NOTE — Telephone Encounter (Signed)
Looks like patient is already at ED.

## 2017-10-04 NOTE — ED Provider Notes (Signed)
[facility] EMERGENCY DEPARTMENT Provider Note   CSN: [id number] Arrival date & time: 10/04/17  1300     History   Chief Complaint Chief Complaint  Patient presents with  . Rectal Bleeding    HPI Ian Weber is a 83 y.o. male with a history of hypertension, hypothyroidism, and diverticulosis who presents the emergency department with 2 episodes of bright red blood per rectum this morning.  Patient states that he has intermittent problems with constipation which he takes MiraLAX for.  Most recently took MiraLAX last night.  States this morning he had 2 bowel movements which were normal with bright red blood in the toilet bowl and on the toilet paper. Bleeding resolved quickly after wiping.  He states he was concerned because he has required blood transfusion previously with bright red blood per rectum. Was admitted 09/24/17, had colonoscopy 09/25/17-surgical note reviewed- preparation of colon was inadequate- diverticulosis in sigmoid colon identified as probably source of bleeding. Patient denies abdominal pain, lightheadedness, chest pain, dyspnea, nausea, vomiting, or syncope.   HPI  Past Medical History:  Diagnosis Date  . Depression   . Diverticulosis of colon (without mention of hemorrhage)   . Esophageal reflux   . Hiatal hernia   . Hypertension   . Hypothyroidism   . Irritable bowel syndrome   . Lactose intolerance   . Stricture and stenosis of esophagus   . Thyroid cancer ([place])   . Unspecified essential hypertension     Patient Active Problem List   Diagnosis Date Noted  . Anxiety 09/24/2017  . GIB (gastrointestinal bleeding) 09/24/2017  . 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 Constipation 01/02/2012  . BRBPR (bright red blood per rectum) 01/02/2012  . VITAMIN D DEFICIENCY 10/22/2009  . Diverticulosis of colon with hemorrhage 10/22/2009  . ABDOMINAL PAIN, EPIGASTRIC 10/22/2009  . THYROID CANCER, HX OF 10/22/2009  . HYPOTHYROIDISM, POSTSURGICAL  10/20/2009  . Essential hypertension 10/20/2009  . ESOPHAGEAL STRICTURE 10/20/2009  . GERD 10/20/2009  . HIATAL HERNIA 10/20/2009    Past Surgical History:  Procedure Laterality Date  . CARDIOVERSION  08/08/2012   Procedure: CARDIOVERSION;  Surgeon: Laverda Page, MD;  Location: [facility];  Service: Cardiovascular;  Laterality: N/A;  . COLONOSCOPY    . COLONOSCOPY WITH PROPOFOL N/A 09/25/2017   Procedure: COLONOSCOPY WITH PROPOFOL;  Surgeon: Gatha Mayer, MD;  Location: [facility];  Service: Endoscopy;  Laterality: N/A;  moderate sedation acceptable also  . HERNIA REPAIR  1992  . THYROIDECTOMY  1990   Dr Deon Pilling     Home Medications    Prior to Admission medications   Medication Sig Start Date End Date Taking? Authorizing Provider  ALPRAZolam Duanne Moron) 0.25 MG tablet Take 0.25 mg by mouth at bedtime as needed for anxiety or sleep.  04/14/16  Yes [provider]  levothyroxine (SYNTHROID, LEVOTHROID) 100 MCG tablet Take 100 mcg by mouth daily.   Yes [provider]  losartan (COZAAR) 50 MG tablet Take 50 mg by mouth daily.   Yes [provider]  nebivolol (BYSTOLIC) 2.5 MG tablet Take 2.5 mg by mouth daily.   Yes [provider]  polyethylene glycol powder (GLYCOLAX/MIRALAX) powder TAKE 17 G BY MOUTH 2 (TWO) TIMES DAILY. 09/28/17  Yes Danis, Kirke Corin, MD  aspirin 81 MG tablet Take 1 tablet (81 mg total) by mouth daily. 09/27/17   Hosie Poisson, MD  pantoprazole (PROTONIX) 40 MG tablet Take 1 tablet (40 mg total) by mouth 2 (two) times  daily. Patient not taking: Reported on 10/04/2017 09/27/17   Hosie Poisson, MD    Family History History reviewed. No pertinent family history.  Social History Social History   Tobacco Use  . Smoking status: Never Smoker  . Smokeless tobacco: Never Used  Substance Use Topics  . Alcohol use: No  . Drug use: No     Allergies   Amlodipine; Chlorthalidone; Codeine phosphate; Hydrochlorothiazide  w-triamterene; Prednisone; Sulfamethoxazole; and Triamterene   Review of Systems Review of Systems  Constitutional: Negative for chills and fever.  Eyes: Negative for visual disturbance.  Respiratory: Negative for shortness of breath.   Cardiovascular: Negative for chest pain.  Gastrointestinal: Positive for blood in stool and constipation (intermittent, chronic). Negative for abdominal pain, diarrhea, nausea and vomiting.  Genitourinary: Negative for dysuria.  Neurological: Negative for dizziness, weakness, light-headedness and numbness.  All other systems reviewed and are negative.    Physical Exam Updated Vital Signs BP 112/61   Pulse (!) 49   Temp 97.8 F (36.6 C) (Oral)   Resp 13   SpO2 100%   Physical Exam  Constitutional: He appears well-developed and well-nourished. No distress.  HENT:  Head: Normocephalic and atraumatic.  Eyes:  Conjunctiva are somewhat pale bilaterally.   Cardiovascular: Normal rate and regular rhythm.  No murmur heard. Pulmonary/Chest: Breath sounds normal. No respiratory distress. He has no wheezes. He has no rales.  Abdominal: Soft. Bowel sounds are normal. He exhibits no distension. There is no tenderness. There is no rigidity, no rebound and no guarding.  Genitourinary: Rectal exam shows guaiac positive stool. Rectal exam shows no external hemorrhoid, no fissure and anal tone normal.  Genitourinary Comments: Rectum: No active bleeding, no gross blood on DRE, brown stool.   Neurological: He is alert.  Clear speech. Normal gait.   Skin: Skin is warm and dry. No rash noted.  Psychiatric: He has a normal mood and affect. His behavior is normal.  Nursing note and vitals reviewed.   ED Treatments / Results  Labs Results for orders placed or performed during the hospital encounter of 10/04/17  CBC with Differential  Result Value Ref Range   WBC 5.8 4.0 - 10.5 K/uL   RBC 3.00 (L) 4.22 - 5.81 MIL/uL   Hemoglobin 9.3 (L) 13.0 - 17.0 g/dL   HCT  28.4 (L) 39.0 - 52.0 %   MCV 94.7 78.0 - 100.0 fL   MCH 31.0 26.0 - 34.0 pg   MCHC 32.7 30.0 - 36.0 g/dL   RDW 14.3 11.5 - 15.5 %   Platelets 233 150 - 400 K/uL   Neutrophils Relative % 75 %   Neutro Abs 4.3 1.7 - 7.7 K/uL   Lymphocytes Relative 19 %   Lymphs Abs 1.1 0.7 - 4.0 K/uL   Monocytes Relative 6 %   Monocytes Absolute 0.4 0.1 - 1.0 K/uL   Eosinophils Relative 0 %   Eosinophils Absolute 0.0 0.0 - 0.7 K/uL   Basophils Relative 0 %   Basophils Absolute 0.0 0.0 - 0.1 K/uL  Comprehensive metabolic panel  Result Value Ref Range   Sodium 138 135 - 145 mmol/L   Potassium 4.6 3.5 - 5.1 mmol/L   Chloride 106 101 - 111 mmol/L   CO2 24 22 - 32 mmol/L   Glucose, Bld 94 65 - 99 mg/dL   BUN 10 6 - 20 mg/dL   Creatinine, Ser 0.79 0.61 - 1.24 mg/dL   Calcium 8.4 (L) 8.9 - 10.3 mg/dL   Total Protein 5.7 (  L) 6.5 - 8.1 g/dL   Albumin 3.3 (L) 3.5 - 5.0 g/dL   AST 19 15 - 41 U/L   ALT 13 (L) 17 - 63 U/L   Alkaline Phosphatase 56 38 - 126 U/L   Total Bilirubin 0.6 0.3 - 1.2 mg/dL   GFR calc non Af Amer >60 >60 mL/min   GFR calc Af Amer >60 >60 mL/min   Anion gap 8 5 - 15  POC occult blood, ED  Result Value Ref Range   Fecal Occult Bld POSITIVE (A) NEGATIVE   No results found. EKG  EKG Interpretation None      Radiology No results found.  Procedures Procedures (including critical care time)  Medications Ordered in ED Medications - No data to display   Initial Impression / Assessment and Plan / ED Course  I have reviewed the triage vital signs and the nursing notes.  Pertinent labs & imaging results that were available during my care of the patient were reviewed by me and considered in my medical decision making (see chart for details).  Patient presents with bright red blood per rectum x 2 this AM.  Presented to the emergency department due to concern of requiring previous blood transfusion.  Patient is nontoxic-appearing, in no apparent distress, with stable vital  signs.  He is noted to be bradycardic however this appears to be his baseline.  He is currently asymptomatic, rectal bleeding resolved following wiping with toilet paper with both episodes.  Patient's fecal occult blood is positive- no gross blood on exam.  Hemoglobin of 9.3, most recently 10.3 one week prior.  Had recent colonoscopy on 12/18 with suspicion for diverticulosis as a source of bleeding.  At present patient is asymptomatic-no lightheadedness, dizziness, syncope, chest pain, or dyspnea. He is hemodynamically stable and safe for discharge home. He has appointment scheduled with PCP for tomorrow, instructed to keep this appointment for close follow up.  Additionally patient with some confusion about his current medication regimen, I went over this with him and his wife, and suggested that he also discuss this again with his primary care provider tomorrow to ensure complete understanding.    Findings and plan of care discussed with supervising physician Dr. Eulis Foster who personally evaluated and examined this patient- in agreement with DC home, he recommended starting patient on Iron supplement. We additionally provided handout on foods that are high in fiber.   I discussed results, treatment plan, need for PCP follow-up, and return precautions with the patient and his wife. Provided opportunity for questions, patient and his wife confirmed understanding and are in agreement with plan.     Final Clinical Impressions(s) / ED Diagnoses   Final diagnoses:  Rectal bleeding    ED Discharge Orders        Ordered    ferrous sulfate 325 (65 FE) MG tablet  Daily     10/04/17 [address] [place], PA-C 10/04/17 Joanie Coddington, MD 10/05/17 1931

## 2017-10-10 ENCOUNTER — Telehealth: Payer: Self-pay | Admitting: Gastroenterology

## 2017-10-10 NOTE — Telephone Encounter (Signed)
On patient's discharge instructions let him know that he was to start his aspirin back on 10/15/17. Spoke to patient about this, let him know that if Dr. Loletha Carrow wanted to adjust this date I would call him back, otherwise continue with plan for 10/15/17.

## 2017-10-11 NOTE — Telephone Encounter (Signed)
Yes, resuming the aspirin on Jan 7th is fine, thanks

## 2017-10-26 ENCOUNTER — Other Ambulatory Visit: Payer: Self-pay

## 2017-11-02 ENCOUNTER — Encounter: Payer: Self-pay | Admitting: Gastroenterology

## 2017-11-02 ENCOUNTER — Other Ambulatory Visit (INDEPENDENT_AMBULATORY_CARE_PROVIDER_SITE_OTHER): Payer: Medicare HMO

## 2017-11-02 ENCOUNTER — Ambulatory Visit: Payer: Medicare HMO | Admitting: Gastroenterology

## 2017-11-02 VITALS — BP 118/80 | HR 74 | Ht 70.0 in | Wt 190.2 lb

## 2017-11-02 DIAGNOSIS — K5909 Other constipation: Secondary | ICD-10-CM

## 2017-11-02 DIAGNOSIS — D62 Acute posthemorrhagic anemia: Secondary | ICD-10-CM | POA: Diagnosis not present

## 2017-11-02 DIAGNOSIS — K5731 Diverticulosis of large intestine without perforation or abscess with bleeding: Secondary | ICD-10-CM | POA: Diagnosis not present

## 2017-11-02 LAB — CBC WITH DIFFERENTIAL/PLATELET
BASOS ABS: 0 10*3/uL (ref 0.0–0.1)
Basophils Relative: 1.1 % (ref 0.0–3.0)
EOS PCT: 1.4 % (ref 0.0–5.0)
Eosinophils Absolute: 0.1 10*3/uL (ref 0.0–0.7)
HCT: 36.6 % — ABNORMAL LOW (ref 39.0–52.0)
Hemoglobin: 12.2 g/dL — ABNORMAL LOW (ref 13.0–17.0)
LYMPHS ABS: 1.1 10*3/uL (ref 0.7–4.0)
Lymphocytes Relative: 27.9 % (ref 12.0–46.0)
MCHC: 33.3 g/dL (ref 30.0–36.0)
MCV: 99.4 fl (ref 78.0–100.0)
MONO ABS: 0.3 10*3/uL (ref 0.1–1.0)
MONOS PCT: 6.4 % (ref 3.0–12.0)
NEUTROS PCT: 63.2 % (ref 43.0–77.0)
Neutro Abs: 2.5 10*3/uL (ref 1.4–7.7)
Platelets: 204 10*3/uL (ref 150.0–400.0)
RBC: 3.69 Mil/uL — AB (ref 4.22–5.81)
RDW: 15.5 % (ref 11.5–15.5)
WBC: 4 10*3/uL (ref 4.0–10.5)

## 2017-11-02 NOTE — Progress Notes (Signed)
     [facility] Progress Note  Chief Complaint: Diverticular bleed and acute blood loss anemia  Subjective  History:  Ian Weber follows up after his hospital stay in December for a diverticular bleed.  Dr. Arelia Longest evaluated him, colonoscopy was performed on 09/24/2017.  There was blood throughout the colon and left-sided diverticulosis.  After discharge he had a brief episode of rebleeding evaluated in the emergency department.  It seemed likely to have been old blood, his hemoglobin was stable for discharge and he was sent home.  Ian Weber has done well since then with no recurrent bleeding, no abdominal pain, loss of appetite or weight loss.  He remains a somewhat limited historian before with a restricted affect.  He still complains of chronic constipation that has lately been under good control with once daily MiraLAX.  ROS: Cardiovascular:  no chest pain Respiratory: no dyspnea  The patient's Past Medical, Family and Social History were reviewed and are on file in the EMR.  Objective:  Med list reviewed  Current Outpatient Medications:  .  ALPRAZolam (XANAX) 0.25 MG tablet, Take 0.25 mg by mouth at bedtime as needed for anxiety or sleep. , Disp: , Rfl:  .  aspirin 81 MG tablet, Take 1 tablet (81 mg total) by mouth daily., Disp: 30 tablet, Rfl:  .  ferrous sulfate 325 (65 FE) MG tablet, Take 1 tablet (325 mg total) by mouth daily., Disp: 30 tablet, Rfl: 0 .  levothyroxine (SYNTHROID, LEVOTHROID) 100 MCG tablet, Take 100 mcg by mouth daily., Disp: , Rfl:  .  losartan (COZAAR) 50 MG tablet, Take 50 mg by mouth daily., Disp: , Rfl:  .  nebivolol (BYSTOLIC) 2.5 MG tablet, Take 2.5 mg by mouth daily., Disp: , Rfl:  .  polyethylene glycol powder (GLYCOLAX/MIRALAX) powder, TAKE 17 G BY MOUTH 2 (TWO) TIMES DAILY., Disp: 1054 g, Rfl: 2   Vital signs in last 24 hrs: Vitals:   11/02/17 1027  BP: 118/80  Pulse: 74    Physical Exam  Restricted affect  HEENT: sclera anicteric, oral mucosa  moist without lesions  Neck: supple, no thyromegaly, JVD or lymphadenopathy  Cardiac: RRR without murmurs, S1S2 heard, no peripheral edema  Pulm: clear to auscultation bilaterally, normal RR and effort noted  Abdomen: soft, no tenderness, with active bowel sounds. No guarding or palpable hepatosplenomegaly.  Skin; warm and dry, no jaundice or rash  Recent Labs:  Hemoglobin 9.1 MCV 101 on 10/05/2017, done at primary care office.  @ASSESSMENTPLANBEGIN @ Assessment: Encounter Diagnoses  Name Primary?  . Diverticulosis of colon with hemorrhage Yes  . Acute blood loss anemia   . Chronic constipation     He had a self-limited diverticular bleed with resultant acute blood loss anemia.  There is been no recurrent bleeding since discharge, and his constipation is under good control.  Plan: Continue current bowel regimen CBC today  Total time 20 minutes, over half spent in counseling and coordination of care.   [name] CC: Jilda Panda, MD

## 2017-11-02 NOTE — Patient Instructions (Signed)
If you are age 82 or older, your body mass index should be between 23-30. Your Body mass index is 27.3 kg/m. If this is out of the aforementioned range listed, please consider follow up with your Primary Care Provider.  If you are age 19 or younger, your body mass index should be between 19-25. Your Body mass index is 27.3 kg/m. If this is out of the aformentioned range listed, please consider follow up with your Primary Care Provider.   Your physician has requested that you go to the basement for the following lab work before leaving today: CBC   Thank you for choosing [facility]  Dr Wilfrid Lund III

## 2018-05-17 ENCOUNTER — Telehealth: Payer: Self-pay | Admitting: Gastroenterology

## 2018-05-17 NOTE — Telephone Encounter (Signed)
Spoke to patient's wife, patient is only taking the Miralax once a day and having only a small amount of stool results. Instructed to increase it to BID and may need to titrate up or down. I have instructed patient to contact office if this is not effective.

## 2018-05-17 NOTE — Telephone Encounter (Signed)
Left message to call back  

## 2018-05-20 NOTE — Telephone Encounter (Signed)
Called and spoke to patient's wife, she states that he is doing better with the increased Miralax. They understand to contact office if they have questions or concerns.

## 2019-03-06 ENCOUNTER — Encounter: Payer: Self-pay | Admitting: Cardiology

## 2019-03-06 ENCOUNTER — Other Ambulatory Visit: Payer: Self-pay

## 2019-03-06 ENCOUNTER — Ambulatory Visit (INDEPENDENT_AMBULATORY_CARE_PROVIDER_SITE_OTHER): Payer: Medicare HMO | Admitting: Cardiology

## 2019-03-06 VITALS — Ht 71.0 in | Wt 190.0 lb

## 2019-03-06 DIAGNOSIS — I10 Essential (primary) hypertension: Secondary | ICD-10-CM

## 2019-03-06 DIAGNOSIS — F419 Anxiety disorder, unspecified: Secondary | ICD-10-CM | POA: Diagnosis not present

## 2019-03-06 NOTE — Progress Notes (Signed)
Virtual Visit via Telephone Note: Patient unable to use video assisted device.  This visit type was conducted due to national recommendations for restrictions regarding the COVID-19 Pandemic (e.g. social distancing).  This format is felt to be most appropriate for this patient at this time.  All issues noted in this document were discussed and addressed.  No physical exam was performed.  The patient has consented to conduct a Telehealth visit and understands insurance will be billed.   I connected with@, on 03/06/19 at  by TELEPHONE and verified that I am speaking with the correct person using two identifiers.   I discussed the limitations of evaluation and management by telemedicine and the availability of in person appointments. The patient expressed understanding and agreed to proceed.   I have discussed with patient regarding the safety during COVID Pandemic and steps and precautions to be taken including social distancing, frequent hand wash and use of detergent soap, gels with the patient. I asked the patient to avoid touching mouth, nose, eyes, ears with the hands. I encouraged regular walking around the neighborhood and exercise and regular diet, as long as social distancing can be maintained.  Primary Physician/Referring:  Jilda Panda, MD  Patient ID: Ian Weber, male    DOB: 11/02/1933, 85 y.o.   MRN: [id number]  Chief Complaint  Patient presents with  . Hypertension  . Follow-up    HPI: Ian Weber  is a 85 y.o. male  with  history of atrial fibrillation in 2013 when he underwent direct current cardioversion and has maintained sinus rhythm. He does not want to be on anticoagulation. He also has hypertension and chronic brief palpitations.  He is very anxious and has and also has had atypical chest pain suggestive of musculoskeletal pain.  This is his 12 month office visit, he has not had any further palpitation since being on Bystolic, has baseline underlying sinus  bradycardia.  He presently denies any chest pain.   No dyspnea, no PND or orthopnea. Leg edema is remained stable.  Past Medical History:  Diagnosis Date  . Anxiety   . Chronic venous stasis   . Depression   . Diverticulosis   . Diverticulosis of colon (without mention of hemorrhage)   . Esophageal reflux   . GERD (gastroesophageal reflux disease)   . Hiatal hernia   . History of BPH   . Hypothyroidism   . Irritable bowel syndrome   . Lactose intolerance   . Stricture and stenosis of esophagus   . Thyroid cancer ([place])   . Vitamin D insufficiency     Past Surgical History:  Procedure Laterality Date  . CARDIOVERSION  08/08/2012   Procedure: CARDIOVERSION;  Surgeon: Laverda Page, MD;  Location: [facility];  Service: Cardiovascular;  Laterality: N/A;  . CATARACT EXTRACTION    . COLONOSCOPY    . COLONOSCOPY WITH PROPOFOL N/A 09/25/2017   Procedure: COLONOSCOPY WITH PROPOFOL;  Surgeon: Gatha Mayer, MD;  Location: [facility];  Service: Endoscopy;  Laterality: N/A;  moderate sedation acceptable also  . HERNIA REPAIR  1992  . THYROIDECTOMY  1990   Dr Deon Pilling    Social History   Socioeconomic History  . Marital status: Married    Spouse name: Cally   . Number of children: 8  . Years of education: 8  . Highest education level: Not on file  Occupational History  . Occupation: Retired  Scientific laboratory technician  . Financial resource strain: Not on file  . Food insecurity:  Worry: Not on file    Inability: Not on file  . Transportation needs:    Medical: Not on file    Non-medical: Not on file  Tobacco Use  . Smoking status: Never Smoker  . Smokeless tobacco: Never Used  Substance and Sexual Activity  . Alcohol use: No  . Drug use: No  . Sexual activity: Yes  Lifestyle  . Physical activity:    Days per week: Not on file    Minutes per session: Not on file  . Stress: Not on file  Relationships  . Social connections:    Talks on phone: Not on file    Gets together: Not  on file    Attends religious service: Not on file    Active member of club or organization: Not on file    Attends meetings of clubs or organizations: Not on file    Relationship status: Not on file  . Intimate partner violence:    Fear of current or ex partner: Not on file    Emotionally abused: Not on file    Physically abused: Not on file    Forced sexual activity: Not on file  Other Topics Concern  . Not on file  Social History Narrative   Lives with wife   Caffeine use:  none    Review of Systems  Constitution: Negative for chills, decreased appetite, malaise/fatigue and weight gain.  Cardiovascular: Positive for leg swelling (chronic and uses support stocking). Negative for dyspnea on exertion and syncope.  Endocrine: Negative for cold intolerance.  Hematologic/Lymphatic: Does not bruise/bleed easily.  Musculoskeletal: Negative for joint swelling.  Gastrointestinal: Negative for abdominal pain, anorexia, change in bowel habit, hematochezia and melena.  Neurological: Negative for headaches and light-headedness.  Psychiatric/Behavioral: Negative for depression and substance abuse.  All other systems reviewed and are negative.     Objective  Height 5\' 11"  (1.803 m), weight 190 lb (86.2 kg). Body mass index is 26.5 kg/m.    Physical exam not performed or limited due to virtual visit. Radiology: No results found.  Laboratory examination:    CMP Latest Ref Rng & Units 10/04/2017 09/26/2017 09/25/2017  Glucose 65 - 99 mg/dL 94 88 129(H)  BUN 6 - 20 mg/dL 10 10 14   Creatinine 0.61 - 1.24 mg/dL 0.79 0.59(L) 0.62  Sodium 135 - 145 mmol/L 138 141 142  Potassium 3.5 - 5.1 mmol/L 4.6 4.0 3.9  Chloride 101 - 111 mmol/L 106 113(H) 112(H)  CO2 22 - 32 mmol/L 24 25 25   Calcium 8.9 - 10.3 mg/dL 8.4(L) 7.8(L) 7.8(L)  Total Protein 6.5 - 8.1 g/dL 5.7(L) - -  Total Bilirubin 0.3 - 1.2 mg/dL 0.6 - -  Alkaline Phos 38 - 126 U/L 56 - -  AST 15 - 41 U/L 19 - -  ALT 17 - 63 U/L 13(L)  - -   CBC Latest Ref Rng & Units 11/02/2017 10/04/2017 09/27/2017  WBC 4.0 - 10.5 K/uL 4.0 5.8 -  Hemoglobin 13.0 - 17.0 g/dL 12.2(L) 9.3(L) 10.1(L)  Hematocrit 39.0 - 52.0 % 36.6(L) 28.4(L) 29.8(L)  Platelets 150.0 - 400.0 K/uL 204.0 233 -   Lipid Panel  No results found for: CHOL, TRIG, HDL, CHOLHDL, VLDL, LDLCALC, LDLDIRECT HEMOGLOBIN A1C No results found for: HGBA1C, MPG TSH No results for input(s): TSH in the last 8760 hours.  PRN Meds:. Medications Discontinued During This Encounter  Medication Reason  . ferrous sulfate 325 (65 FE) MG tablet Error   Current Meds  Medication Sig  .  ALPRAZolam (XANAX) 0.25 MG tablet Take 0.25 mg by mouth at bedtime as needed for anxiety or sleep.   Marland Kitchen aspirin 81 MG tablet Take 1 tablet (81 mg total) by mouth daily.  . hydrALAZINE (APRESOLINE) 50 MG tablet Take 50 mg by mouth 2 (two) times a day.  . Levothyroxine Sodium 112 MCG CAPS Take 112 mcg by mouth daily.   Marland Kitchen losartan (COZAAR) 50 MG tablet Take 50 mg by mouth daily.  . nebivolol (BYSTOLIC) 2.5 MG tablet Take 2.5 mg by mouth daily.  . polyethylene glycol powder (GLYCOLAX/MIRALAX) powder TAKE 17 G BY MOUTH 2 (TWO) TIMES DAILY. (Patient taking differently: daily as needed. )    Cardiac Studies:    Event monitor 03/20/2013 through 04/20/2013:  Normal sinus rhythm, sinus bradycardia, lowest heart rate 39 bpm, no atrial fibrillation. No heart block. Occasional PVCs. No symptoms reported.  Echocardiogram 09/12/12- Borderline enl. LV,EF-55%,Mildy enl. LA,RA, Mild MR,TR  Treadmill stress test  GXT 08/10/12:  Indicatin: Assessment of Chest Pain/A.Fibrillation: Findings: Frequent PAC, Rare PVC, No A. Fibrillation. Negative for ischemia. Hypertensive BP response.4:30 minutes on Bruce protocol. Bystolic restarted for palpitations and hypertension.  Assessment   Essential hypertension  Anxiety   EKG 02/25/2018: Sinus bradycardia at the rate of 59 bpm him a normal axis, no evidence of ischemia.  Normal EKG. No significant change from EKG 05/17/2017  Recommendations:   Patient with remote atrial fibrillation in 2013 with no recurrence, also does not want to be on anticoagulants.  States that his blood pressure has been well controlled.  He has resting sinus bradycardia however tolerating Bystolic without any complications of dizziness fatigue.  He is being closely monitored by his PCP.  He does have generalized anxiety, has had several episodes of prior evaluations for chest pain.  Is remains asymptomatic, states that his blood pressure is well controlled, I will see him back on a as needed basis.  Adrian Prows, MD, [facility] 03/06/2019, 11:01 AM [facility]. Rosa Sanchez Pager: [phone] Office: [phone] If no answer Cell [phone]

## 2020-01-02 ENCOUNTER — Ambulatory Visit: Payer: Medicare HMO | Attending: Internal Medicine

## 2020-06-30 ENCOUNTER — Other Ambulatory Visit: Payer: Self-pay

## 2020-06-30 DIAGNOSIS — I872 Venous insufficiency (chronic) (peripheral): Secondary | ICD-10-CM

## 2020-07-14 ENCOUNTER — Ambulatory Visit (HOSPITAL_COMMUNITY)
Admission: RE | Admit: 2020-07-14 | Discharge: 2020-07-14 | Disposition: A | Discharge status: Home / Self Care | Payer: Medicare HMO | Source: Ambulatory Visit | Attending: Vascular Surgery | Admitting: Vascular Surgery

## 2020-07-14 ENCOUNTER — Encounter: Payer: Self-pay | Admitting: Vascular Surgery

## 2020-07-14 ENCOUNTER — Ambulatory Visit: Payer: Medicare HMO | Admitting: Vascular Surgery

## 2020-07-14 ENCOUNTER — Other Ambulatory Visit: Payer: Self-pay

## 2020-07-14 VITALS — BP 142/73 | HR 57 | Temp 98.1°F | Resp 20 | Ht 71.0 in | Wt 175.0 lb

## 2020-07-14 DIAGNOSIS — I872 Venous insufficiency (chronic) (peripheral): Secondary | ICD-10-CM

## 2020-07-14 NOTE — Progress Notes (Signed)
REASON FOR CONSULT:    Chronic venous insufficiency.  The consult is requested by Dr. Jilda Panda.   ASSESSMENT & PLAN:   COMBINED CHRONIC VENOUS INSUFFICIENCY AND LYMPHEDEMA: Based on his exam he has evidence of combined chronic venous insufficiency (CEAP C4a venous disease) and lymphedema.  I had a long discussion with him about the importance of intermittent leg elevation and the proper positioning for this.  I think he needs to be fitted for at least knee-high compression stockings with a gradient of 15 to 20 mmHg.  However, until he elevates his legs some and gets the swelling down I do not think it would be worthwhile to fit him for stockings today.  I encouraged him to elevate his legs as much as possible for the next 2 weeks and then come back to have stockings fitted.  I have also encouraged him to avoid prolonged sitting and standing.  We discussed the importance of exercise specifically walking and water aerobics.  I plan on seeing him back for an office visit in 3 months.  If his swelling worsens then I think we could potentially consider him for a pneumatic compression device.  Deitra Mayo, MD Office: [phone]   HPI:   Ian Weber is a pleasant 86 y.o. male, who presents with a long history of leg swelling.  He states that he had this for many years.  Of note the patient is somewhat of a poor historian.  He states that the swelling is not tender.  He is unaware of any previous history of DVT.  Has had no previous venous procedures.  He does not currently wear compression stockings.  He does elevate his legs some in his recliner.  His activity is very limited as he is somewhat frail.  I did review the records from the referring office.  The patient was seen on 03/11/2020 with multiple medical issues including hypertension, status post thyroidectomy for carcinoma of the thyroid in 1989, gastroesophageal reflux disease, osteoarthritis, and multiple other issues including  cognitive impairment.  He has been followed with chronic venous insufficiency and as this was worsening he is sent for vascular consultation.  Past Medical History:  Diagnosis Date  . Anxiety   . Chronic venous stasis   . Depression   . Diverticulosis   . Diverticulosis of colon (without mention of hemorrhage)   . Esophageal reflux   . GERD (gastroesophageal reflux disease)   . Hiatal hernia   . History of BPH   . Hypothyroidism   . Irritable bowel syndrome   . Lactose intolerance   . Stricture and stenosis of esophagus   . Thyroid cancer ([place])   . Vitamin D insufficiency     Family History  Family history unknown: Yes    SOCIAL HISTORY: Social History   Socioeconomic History  . Marital status: Married    Spouse name: Cally   . Number of children: 8  . Years of education: 8  . Highest education level: Not on file  Occupational History  . Occupation: Retired  Tobacco Use  . Smoking status: Never Smoker  . Smokeless tobacco: Never Used  Vaping Use  . Vaping Use: Never used  Substance and Sexual Activity  . Alcohol use: No  . Drug use: No  . Sexual activity: Yes  Other Topics Concern  . Not on file  Social History Narrative   Lives with wife   Caffeine use:  none   Social Determinants of Health  Financial Resource Strain:   . Difficulty of Paying Living Expenses: Not on file  Food Insecurity:   . Worried About Charity fundraiser in the Last Year: Not on file  . Ran Out of Food in the Last Year: Not on file  Transportation Needs:   . Lack of Transportation (Medical): Not on file  . Lack of Transportation (Non-Medical): Not on file  Physical Activity:   . Days of Exercise per Week: Not on file  . Minutes of Exercise per Session: Not on file  Stress:   . Feeling of Stress : Not on file  Social Connections:   . Frequency of Communication with Friends and Family: Not on file  . Frequency of Social Gatherings with Friends and Family: Not on file  .  Attends Religious Services: Not on file  . Active Member of Clubs or Organizations: Not on file  . Attends Archivist Meetings: Not on file  . Marital Status: Not on file  Intimate Partner Violence:   . Fear of Current or Ex-Partner: Not on file  . Emotionally Abused: Not on file  . Physically Abused: Not on file  . Sexually Abused: Not on file    Allergies  Allergen Reactions  . Amlodipine Other (See Comments)    Reaction not recalled by patient  . Chlorthalidone Other (See Comments)    Reaction not recalled by patient  . Codeine Phosphate Other (See Comments)    Weakness, sweating  . Hydrochlorothiazide W-Triamterene Other (See Comments)    Reaction not recalled by patient  . Prednisone Itching  . Sulfamethoxazole Itching  . Triamterene Other (See Comments)    Reaction not recalled by patient    Current Outpatient Medications  Medication Sig Dispense Refill  . ALPRAZolam (XANAX) 0.25 MG tablet Take 0.25 mg by mouth at bedtime as needed for anxiety or sleep.     Marland Kitchen aspirin 81 MG tablet Take 1 tablet (81 mg total) by mouth daily. 30 tablet   . furosemide (LASIX) 40 MG tablet Take 40 mg by mouth daily.    . hydrALAZINE (APRESOLINE) 25 MG tablet Take 25 mg by mouth 2 (two) times daily.    Marland Kitchen levothyroxine (SYNTHROID) 125 MCG tablet levothyroxine 125 mcg tablet  TAKE 1 TABLET BY MOUTH EVERY DAY    . losartan (COZAAR) 25 MG tablet Take 25 mg by mouth 2 (two) times daily.    . nebivolol (BYSTOLIC) 2.5 MG tablet Take 2.5 mg by mouth daily.    . polyethylene glycol powder (GLYCOLAX/MIRALAX) powder TAKE 17 G BY MOUTH 2 (TWO) TIMES DAILY. (Patient taking differently: daily as needed. ) 1054 g 2  . terbinafine (LAMISIL) 250 MG tablet terbinafine HCl 250 mg tablet  TAKE 1 TABLET BY MOUTH DAILY     No current facility-administered medications for this visit.    REVIEW OF SYSTEMS:  [X]  denotes positive finding, [ ]  denotes negative finding Cardiac  Comments:  Chest pain or  chest pressure:    Shortness of breath upon exertion:    Short of breath when lying flat:    Irregular heart rhythm:        Vascular    Pain in calf, thigh, or hip brought on by ambulation:    Pain in feet at night that wakes you up from your sleep:     Blood clot in your veins:    Leg swelling:         Pulmonary    Oxygen at home:  Productive cough:     Wheezing:         Neurologic    Sudden weakness in arms or legs:     Sudden numbness in arms or legs:     Sudden onset of difficulty speaking or slurred speech:    Temporary loss of vision in one eye:     Problems with dizziness:         Gastrointestinal    Blood in stool:     Vomited blood:         Genitourinary    Burning when urinating:     Blood in urine:        Psychiatric    Major depression:         Hematologic    Bleeding problems:    Problems with blood clotting too easily:        Skin    Rashes or ulcers:        Constitutional    Fever or chills:     PHYSICAL EXAM:   Vitals:   07/14/20 1338  BP: (!) 142/73  Pulse: (!) 57  Resp: 20  Temp: 98.1 F (36.7 C)  SpO2: 98%  Weight: 175 lb (79.4 kg)  Height: 5\' 11"  (1.803 m)    GENERAL: The patient is a well-nourished male, in no acute distress. The vital signs are documented above. CARDIAC: There is a regular rate and rhythm.  VASCULAR: I do not hear carotid bruits. He has bilateral pedal pulses. He has significant bilateral lower extremity swelling with hyperpigmentation.  This is more significant on the right side.   RIGHT LEG: Calf 39 cm, ankle 32.5 cm, foot 29 cm. LEFT LEG: Calf 37 cm, ankle 31.5 cm, foot 28 cm. PULMONARY: There is good air exchange bilaterally without wheezing or rales. ABDOMEN: Soft and non-tender with normal pitched bowel sounds.  MUSCULOSKELETAL: There are no major deformities or cyanosis. NEUROLOGIC: No focal weakness or paresthesias are detected. SKIN: There are no ulcers or rashes noted. PSYCHIATRIC: The patient  has a normal affect.  DATA:    VENOUS DUPLEX: I have independently interpreted his venous duplex scan today.  On the right side there is no evidence of DVT or superficial venous thrombosis.  There is deep venous reflux involving the femoral vein.  There is no significant superficial venous reflux.  On the left side there is no evidence of DVT or superficial venous thrombosis.  There is no significant deep venous reflux.  There is no significant superficial venous reflux.

## 2020-07-28 DIAGNOSIS — M7989 Other specified soft tissue disorders: Secondary | ICD-10-CM

## 2020-08-26 ENCOUNTER — Telehealth: Payer: Self-pay

## 2020-08-26 NOTE — Telephone Encounter (Signed)
Called about when to wear compression socks. Told them daily and can take off at night

## 2020-10-21 ENCOUNTER — Other Ambulatory Visit: Payer: Self-pay

## 2020-10-21 ENCOUNTER — Encounter: Payer: Self-pay | Admitting: Vascular Surgery

## 2020-10-21 ENCOUNTER — Ambulatory Visit: Payer: Medicare HMO | Admitting: Vascular Surgery

## 2020-10-21 VITALS — BP 119/67 | HR 61 | Temp 97.7°F | Resp 16 | Ht 70.0 in | Wt 169.0 lb

## 2020-10-21 DIAGNOSIS — I872 Venous insufficiency (chronic) (peripheral): Secondary | ICD-10-CM | POA: Diagnosis not present

## 2020-10-21 DIAGNOSIS — I890 Lymphedema, not elsewhere classified: Secondary | ICD-10-CM

## 2020-10-21 NOTE — Progress Notes (Signed)
REASON FOR VISIT:   Follow-up of chronic venous insufficiency.  MEDICAL ISSUES:   CHRONIC VENOUS INSUFFICIENCY/LYMPHEDEMA: The patient swelling in both legs is actually improved.  We have again discussed the importance of intermittent leg elevation and the proper positioning for this.  He does not like the compression stocking so we have fitted him for another pair of knee-high 15-20 stockings.  I have encouraged him to exercise and avoid prolonged sitting and standing.  At this point given that his swelling has improved we will not pursue a pneumatic compression device.  However if his swelling worsens in the future I think this would be a good option.  He will call if his swelling worsens.   HPI:   Ian Weber is a pleasant 87 y.o. male who I last saw on 07/14/2020 with combined chronic venous insufficiency and lymphedema.  He had CEAP C4a venous disease and lymphedema.  At that time we discussed the importance of intermittent leg elevation and the proper positioning for this.  I recommended knee-high compression stockings but encouraged him to elevate his legs to get the swelling down before being fitted.  We also discussed the importance of avoiding prolonged sitting and standing and importance of exercise.  I set him up for 32-month follow-up visit.  I felt if his swelling was not improving we could consider him for a pneumatic compression device.  Of note his noninvasive studies at that time showed deep venous reflux on the right with no significant superficial venous reflux.  On the left side there was no significant venous reflux.  There is no DVT on either side.  Since I saw him last he feels like the swelling in his legs has improved some.  He has been elevating his legs some.  He has been wearing knee-high compression stockings with a gradient of 15 to 20 mmHg.  He tries to exercise but I think his activity is very limited.  He has no other specific complaints.  The swelling does not  cause significant pain.  Past Medical History:  Diagnosis Date  . Anxiety   . Chronic venous stasis   . Depression   . Diverticulosis   . Diverticulosis of colon (without mention of hemorrhage)   . Esophageal reflux   . GERD (gastroesophageal reflux disease)   . Hiatal hernia   . History of BPH   . Hypothyroidism   . Irritable bowel syndrome   . Lactose intolerance   . Stricture and stenosis of esophagus   . Thyroid cancer ([facility])   . Vitamin D insufficiency     Family History  Family history unknown: Yes    SOCIAL HISTORY: Social History   Tobacco Use  . Smoking status: Never Smoker  . Smokeless tobacco: Never Used  Substance Use Topics  . Alcohol use: No    Allergies  Allergen Reactions  . Amlodipine Other (See Comments)    Reaction not recalled by patient  . Chlorthalidone Other (See Comments)    Reaction not recalled by patient  . Codeine Phosphate Other (See Comments)    Weakness, sweating  . Hydrochlorothiazide W-Triamterene Other (See Comments)    Reaction not recalled by patient  . Prednisone Itching  . Sulfamethoxazole Itching  . Triamterene Other (See Comments)    Reaction not recalled by patient    Current Outpatient Medications  Medication Sig Dispense Refill  . ALPRAZolam (XANAX) 0.25 MG tablet Take 0.25 mg by mouth at bedtime as needed for anxiety or  sleep.     . aspirin 81 MG tablet Take 1 tablet (81 mg total) by mouth daily. 30 tablet   . furosemide (LASIX) 40 MG tablet Take 40 mg by mouth daily.    . hydrALAZINE (APRESOLINE) 25 MG tablet Take 25 mg by mouth 2 (two) times daily.    Marland Kitchen levothyroxine (SYNTHROID) 125 MCG tablet levothyroxine 125 mcg tablet  TAKE 1 TABLET BY MOUTH EVERY DAY    . losartan (COZAAR) 25 MG tablet Take 25 mg by mouth 2 (two) times daily.    . nebivolol (BYSTOLIC) 2.5 MG tablet Take 2.5 mg by mouth daily.    . polyethylene glycol powder (GLYCOLAX/MIRALAX) powder TAKE 17 G BY MOUTH 2 (TWO) TIMES DAILY. (Patient taking  differently: daily as needed.) 1054 g 2  . terbinafine (LAMISIL) 250 MG tablet terbinafine HCl 250 mg tablet  TAKE 1 TABLET BY MOUTH DAILY     No current facility-administered medications for this visit.    REVIEW OF SYSTEMS:  [X]  denotes positive finding, [ ]  denotes negative finding Cardiac  Comments:  Chest pain or chest pressure:    Shortness of breath upon exertion:    Short of breath when lying flat:    Irregular heart rhythm:        Vascular    Pain in calf, thigh, or hip brought on by ambulation: x   Pain in feet at night that wakes you up from your sleep:     Blood clot in your veins:    Leg swelling:  x       Pulmonary    Oxygen at home:    Productive cough:     Wheezing:         Neurologic    Sudden weakness in arms or legs:     Sudden numbness in arms or legs:     Sudden onset of difficulty speaking or slurred speech:    Temporary loss of vision in one eye:     Problems with dizziness:         Gastrointestinal    Blood in stool:     Vomited blood:         Genitourinary    Burning when urinating:     Blood in urine:        Psychiatric    Major depression:         Hematologic    Bleeding problems:    Problems with blood clotting too easily:        Skin    Rashes or ulcers:        Constitutional    Fever or chills:     PHYSICAL EXAM:   Vitals:   10/21/20 1304  BP: 119/67  Pulse: 61  Resp: 16  Temp: 97.7 F (36.5 C)  TempSrc: Temporal  SpO2: 98%  Weight: 169 lb (76.7 kg)  Height: 5\' 10"  (1.778 m)    GENERAL: The patient is a well-nourished male, in no acute distress. The vital signs are documented above. CARDIAC: There is a regular rate and rhythm.  VASCULAR: I do not detect carotid bruits. He has palpable dorsalis pedis pulses bilaterally. He has bilateral lower extremity swelling which is more significant on the right side.  He has hyperpigmentation of the right leg consistent with chronic venous insufficiency. RIGHT LEG: Calf 38 cm,  ankle 29.5 cm, foot 29 cm. LEFT LEG: Calf 35 cm, ankle 28.5 cm, foot 28 cm. These numbers have improved compared to the measurements back  on 07/14/2020. PULMONARY: There is good air exchange bilaterally without wheezing or rales. ABDOMEN: Soft and non-tender with normal pitched bowel sounds.  MUSCULOSKELETAL: There are no major deformities or cyanosis. NEUROLOGIC: No focal weakness or paresthesias are detected. SKIN: There are no ulcers or rashes noted. PSYCHIATRIC: The patient has a normal affect.  DATA:    No new data thank you  Deitra Mayo [facility] [phone]

## 2021-02-26 ENCOUNTER — Other Ambulatory Visit: Payer: Self-pay

## 2021-02-26 ENCOUNTER — Encounter (HOSPITAL_COMMUNITY): Payer: Self-pay | Admitting: *Deleted

## 2021-02-26 ENCOUNTER — Emergency Department (HOSPITAL_COMMUNITY): Payer: Medicare HMO

## 2021-02-26 ENCOUNTER — Emergency Department (HOSPITAL_COMMUNITY)
Admission: EM | Admit: 2021-02-26 | Discharge: 2021-02-26 | Disposition: A | Discharge status: Home / Self Care | Payer: Medicare HMO | Attending: Emergency Medicine | Admitting: Emergency Medicine

## 2021-02-26 DIAGNOSIS — Z85850 Personal history of malignant neoplasm of thyroid: Secondary | ICD-10-CM | POA: Diagnosis not present

## 2021-02-26 DIAGNOSIS — Z79899 Other long term (current) drug therapy: Secondary | ICD-10-CM | POA: Insufficient documentation

## 2021-02-26 DIAGNOSIS — Z7982 Long term (current) use of aspirin: Secondary | ICD-10-CM | POA: Diagnosis not present

## 2021-02-26 DIAGNOSIS — E039 Hypothyroidism, unspecified: Secondary | ICD-10-CM | POA: Diagnosis not present

## 2021-02-26 DIAGNOSIS — K5900 Constipation, unspecified: Secondary | ICD-10-CM | POA: Diagnosis not present

## 2021-02-26 DIAGNOSIS — I10 Essential (primary) hypertension: Secondary | ICD-10-CM | POA: Insufficient documentation

## 2021-02-26 MED ORDER — SORBITOL 70 % SOLN
960.0000 mL | TOPICAL_OIL | Freq: Once | ORAL | Status: AC
  Administered 2021-02-26: 960 mL via RECTAL
  Filled 2021-02-26: qty 473

## 2021-02-26 MED ORDER — SENNOSIDES-DOCUSATE SODIUM 8.6-50 MG PO TABS: 1.0000 | ORAL_TABLET | Freq: Every day | ORAL | 0 refills | Status: DC | PRN

## 2021-02-26 NOTE — ED Notes (Signed)
DC instructions reviewed with the pt.  PT verbalized instructions.  PT DC.  

## 2021-02-26 NOTE — Discharge Instructions (Signed)
Take the miralax daily for constipation.  Add Senokot as needed for constipation.  Increase your fluid intake and fiber.

## 2021-02-26 NOTE — ED Provider Notes (Signed)
[facility] EMERGENCY DEPARTMENT Provider Note   CSN: [id number] Arrival date & time: 02/26/21  1410     History Chief Complaint  Patient presents with  . Constipation    Ian Weber is a 87 y.o. male.  Pt presents to the ED today with constipation.  He had a small bowel movement last week (5/14), but nothing since then.  He has tried suppositories, but has not had an enema.  He has had problems with constipation in the past.  No n/v.        Past Medical History:  Diagnosis Date  . Anxiety   . Chronic venous stasis   . Depression   . Diverticulosis   . Diverticulosis of colon (without mention of hemorrhage)   . Esophageal reflux   . GERD (gastroesophageal reflux disease)   . Hiatal hernia   . History of BPH   . Hypothyroidism   . Irritable bowel syndrome   . Lactose intolerance   . Stricture and stenosis of esophagus   . Thyroid cancer ([place])   . Vitamin D insufficiency     Patient Active Problem List   Diagnosis Date Noted  . Anxiety 09/24/2017  . GIB (gastrointestinal bleeding) 09/24/2017  . 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 Constipation 01/02/2012  . BRBPR (bright red blood per rectum) 01/02/2012  . VITAMIN D DEFICIENCY 10/22/2009  . Diverticulosis of colon with hemorrhage 10/22/2009  . ABDOMINAL PAIN, EPIGASTRIC 10/22/2009  . THYROID CANCER, HX OF 10/22/2009  . HYPOTHYROIDISM, POSTSURGICAL 10/20/2009  . Essential hypertension 10/20/2009  . ESOPHAGEAL STRICTURE 10/20/2009  . GERD 10/20/2009  . HIATAL HERNIA 10/20/2009    Past Surgical History:  Procedure Laterality Date  . CARDIOVERSION  08/08/2012   Procedure: CARDIOVERSION;  Surgeon: Laverda Page, MD;  Location: [facility];  Service: Cardiovascular;  Laterality: N/A;  . CATARACT EXTRACTION    . COLONOSCOPY    . COLONOSCOPY WITH PROPOFOL N/A 09/25/2017   Procedure: COLONOSCOPY WITH PROPOFOL;  Surgeon: Gatha Mayer, MD;  Location: [facility];  Service: Endoscopy;   Laterality: N/A;  moderate sedation acceptable also  . HERNIA REPAIR  1992  . THYROIDECTOMY  1990   Dr Deon Pilling       Family History  Family history unknown: Yes    Social History   Tobacco Use  . Smoking status: Never Smoker  . Smokeless tobacco: Never Used  Vaping Use  . Vaping Use: Never used  Substance Use Topics  . Alcohol use: No  . Drug use: No    Home Medications Prior to Admission medications   Medication Sig Start Date End Date Taking? Authorizing Provider  senna-docusate (SENOKOT-S) 8.6-50 MG tablet Take 1 tablet by mouth daily as needed for moderate constipation. 02/26/21  Yes Isla Pence, MD  ALPRAZolam Duanne Moron) 0.25 MG tablet Take 0.25 mg by mouth at bedtime as needed for anxiety or sleep.  04/14/16   [provider]  aspirin 81 MG tablet Take 1 tablet (81 mg total) by mouth daily. 09/27/17   Hosie Poisson, MD  furosemide (LASIX) 40 MG tablet Take 40 mg by mouth daily. 04/30/20   [provider]  hydrALAZINE (APRESOLINE) 25 MG tablet Take 25 mg by mouth 2 (two) times daily. 05/18/20   [provider]  levothyroxine (SYNTHROID) 125 MCG tablet levothyroxine 125 mcg tablet  TAKE 1 TABLET BY MOUTH EVERY DAY    [provider]  losartan (COZAAR) 25 MG tablet Take 25 mg by mouth 2 (two)  times daily. 05/04/20   [provider]  nebivolol (BYSTOLIC) 2.5 MG tablet Take 2.5 mg by mouth daily.    [provider]  polyethylene glycol powder (GLYCOLAX/MIRALAX) powder TAKE 17 G BY MOUTH 2 (TWO) TIMES DAILY. Patient taking differently: daily as needed. 09/28/17   Doran Stabler, MD  terbinafine (LAMISIL) 250 MG tablet terbinafine HCl 250 mg tablet  TAKE 1 TABLET BY MOUTH DAILY    [provider]    Allergies    Amlodipine, Chlorthalidone, Codeine phosphate, Hydrochlorothiazide w-triamterene, Prednisone, Sulfamethoxazole, and Triamterene  Review of Systems   Review of Systems  Gastrointestinal: Positive for  constipation.  All other systems reviewed and are negative.   Physical Exam Updated Vital Signs BP 135/75   Pulse 62   Temp 98.1 F (36.7 C)   Resp 10   SpO2 100%   Physical Exam Vitals and nursing note reviewed. Exam conducted with a chaperone present.  Constitutional:      Appearance: Normal appearance.  HENT:     Head: Normocephalic and atraumatic.     Right Ear: External ear normal.     Left Ear: External ear normal.     Nose: Nose normal.     Mouth/Throat:     Mouth: Mucous membranes are moist.     Pharynx: Oropharynx is clear.  Eyes:     Extraocular Movements: Extraocular movements intact.     Conjunctiva/sclera: Conjunctivae normal.     Pupils: Pupils are equal, round, and reactive to light.  Cardiovascular:     Rate and Rhythm: Normal rate and regular rhythm.     Pulses: Normal pulses.     Heart sounds: Normal heart sounds.  Pulmonary:     Effort: Pulmonary effort is normal.     Breath sounds: Normal breath sounds.  Abdominal:     General: Abdomen is flat. Bowel sounds are decreased.     Palpations: Abdomen is soft.  Genitourinary:    Rectum: No tenderness. Normal anal tone.     Comments: No fecal impaction Musculoskeletal:        General: Normal range of motion.     Cervical back: Normal range of motion and neck supple.  Skin:    General: Skin is warm and dry.     Capillary Refill: Capillary refill takes less than 2 seconds.  Neurological:     General: No focal deficit present.     Mental Status: He is alert and oriented to person, place, and time.  Psychiatric:        Mood and Affect: Mood normal.        Behavior: Behavior normal.     ED Results / Procedures / Treatments   Labs (all labs ordered are listed, but only abnormal results are displayed) Labs Reviewed - No data to display  EKG None  Radiology DG Abdomen Acute W/Chest  Result Date: 02/26/2021 CLINICAL DATA:  Constipation, abdominal pain EXAM: DG ABDOMEN ACUTE WITH 1 VIEW CHEST  COMPARISON:  None. FINDINGS: The bowel gas pattern is normal. There is no evidence of free intraperitoneal air. No suspicious radio-opaque calculi or other significant radiographic abnormality is seen. Heart size and mediastinal contours are within normal limits. Both lungs are clear. IMPRESSION: Negative abdominal radiographs.  No acute cardiopulmonary disease. Electronically Signed   By: Rolm Baptise M.D.   On: 02/26/2021 15:28    Procedures Procedures   Medications Ordered in ED Medications  sorbitol, milk of mag, mineral oil, glycerin (SMOG) enema (has no  administration in time range)    ED Course  I have reviewed the triage vital signs and the nursing notes.  Pertinent labs & imaging results that were available during my care of the patient were reviewed by me and considered in my medical decision making (see chart for details).    MDM Rules/Calculators/A&P                         Pt was given a SMOG enema and had a very large bm and feels better.  There was some blood in his stool.  I think this is from the large and hard stool.  When I did the rectal earlier, there was no gross blood.  Pt is d/c with senokot to add to his miralax prn.  He is encouraged to increase his fluid and fiber intake.  Return if worse.  F/u with pcp. Final Clinical Impression(s) / ED Diagnoses Final diagnoses:  Constipation, unspecified constipation type    Rx / DC Orders ED Discharge Orders         Ordered    senna-docusate (SENOKOT-S) 8.6-50 MG tablet  Daily PRN        02/26/21 1917           Isla Pence, MD 02/26/21 1919

## 2021-02-26 NOTE — ED Triage Notes (Signed)
Pt here for constipation since last Saturday.  Has been using suppositories, but he only has minimal stool output.

## 2021-09-28 ENCOUNTER — Encounter (HOSPITAL_COMMUNITY): Payer: Self-pay

## 2021-09-28 ENCOUNTER — Other Ambulatory Visit: Payer: Self-pay

## 2021-09-28 ENCOUNTER — Emergency Department (HOSPITAL_COMMUNITY): Payer: Medicare HMO

## 2021-09-28 ENCOUNTER — Emergency Department (HOSPITAL_COMMUNITY)
Admission: EM | Admit: 2021-09-28 | Discharge: 2021-09-29 | Disposition: A | Discharge status: Home / Self Care | Payer: Medicare HMO | Attending: Emergency Medicine | Admitting: Emergency Medicine

## 2021-09-28 DIAGNOSIS — Z79899 Other long term (current) drug therapy: Secondary | ICD-10-CM | POA: Insufficient documentation

## 2021-09-28 DIAGNOSIS — Z7982 Long term (current) use of aspirin: Secondary | ICD-10-CM | POA: Diagnosis not present

## 2021-09-28 DIAGNOSIS — R059 Cough, unspecified: Secondary | ICD-10-CM | POA: Diagnosis present

## 2021-09-28 DIAGNOSIS — I10 Essential (primary) hypertension: Secondary | ICD-10-CM | POA: Diagnosis not present

## 2021-09-28 DIAGNOSIS — Z85850 Personal history of malignant neoplasm of thyroid: Secondary | ICD-10-CM | POA: Insufficient documentation

## 2021-09-28 DIAGNOSIS — U071 COVID-19: Secondary | ICD-10-CM | POA: Diagnosis not present

## 2021-09-28 DIAGNOSIS — E039 Hypothyroidism, unspecified: Secondary | ICD-10-CM | POA: Insufficient documentation

## 2021-09-28 LAB — CBC WITH DIFFERENTIAL/PLATELET
Abs Immature Granulocytes: 0.03 10*3/uL (ref 0.00–0.07)
Basophils Absolute: 0 10*3/uL (ref 0.0–0.1)
Basophils Relative: 0 %
Eosinophils Absolute: 0 10*3/uL (ref 0.0–0.5)
Eosinophils Relative: 0 %
HCT: 40.2 % (ref 39.0–52.0)
Hemoglobin: 13.2 g/dL (ref 13.0–17.0)
Immature Granulocytes: 0 %
Lymphocytes Relative: 15 %
Lymphs Abs: 1.5 10*3/uL (ref 0.7–4.0)
MCH: 30.3 pg (ref 26.0–34.0)
MCHC: 32.8 g/dL (ref 30.0–36.0)
MCV: 92.4 fL (ref 80.0–100.0)
Monocytes Absolute: 0.6 10*3/uL (ref 0.1–1.0)
Monocytes Relative: 6 %
Neutro Abs: 8.2 10*3/uL — ABNORMAL HIGH (ref 1.7–7.7)
Neutrophils Relative %: 79 %
Platelets: 174 10*3/uL (ref 150–400)
RBC: 4.35 MIL/uL (ref 4.22–5.81)
RDW: 12.5 % (ref 11.5–15.5)
WBC: 10.4 10*3/uL (ref 4.0–10.5)
nRBC: 0 % (ref 0.0–0.2)

## 2021-09-28 LAB — COMPREHENSIVE METABOLIC PANEL
ALT: 17 U/L (ref 0–44)
AST: 32 U/L (ref 15–41)
Albumin: 3.2 g/dL — ABNORMAL LOW (ref 3.5–5.0)
Alkaline Phosphatase: 73 U/L (ref 38–126)
Anion gap: 9 (ref 5–15)
BUN: 20 mg/dL (ref 8–23)
CO2: 27 mmol/L (ref 22–32)
Calcium: 8.3 mg/dL — ABNORMAL LOW (ref 8.9–10.3)
Chloride: 104 mmol/L (ref 98–111)
Creatinine, Ser: 0.8 mg/dL (ref 0.61–1.24)
GFR, Estimated: >60 mL/min (ref >60)
Glucose, Bld: 100 mg/dL — ABNORMAL HIGH (ref 70–99)
Potassium: 3.4 mmol/L — ABNORMAL LOW (ref 3.5–5.1)
Sodium: 140 mmol/L (ref 135–145)
Total Bilirubin: 1 mg/dL (ref 0.3–1.2)
Total Protein: 6.9 g/dL (ref 6.5–8.1)

## 2021-09-28 LAB — RESP PANEL BY RT-PCR (FLU A&B, COVID) ARPGX2
Influenza A by PCR: NEGATIVE
Influenza B by PCR: NEGATIVE
SARS Coronavirus 2 by RT PCR: POSITIVE — AB

## 2021-09-28 LAB — URINALYSIS, ROUTINE W REFLEX MICROSCOPIC
Bacteria, UA: NONE SEEN
Bilirubin Urine: NEGATIVE
Glucose, UA: NEGATIVE mg/dL
Ketones, ur: NEGATIVE mg/dL
Leukocytes,Ua: NEGATIVE
Nitrite: NEGATIVE
Protein, ur: 30 mg/dL — AB
Specific Gravity, Urine: 1.019 (ref 1.005–1.030)
pH: 5 (ref 5.0–8.0)

## 2021-09-28 MED ORDER — SODIUM CHLORIDE 0.9 % IV BOLUS
500.0000 mL | Freq: Once | INTRAVENOUS | Status: AC
  Administered 2021-09-28: 21:00:00 500 mL via INTRAVENOUS

## 2021-09-28 NOTE — ED Provider Notes (Signed)
[facility] EMERGENCY DEPARTMENT Provider Note   CSN: [id number] Arrival date & time: 09/28/21  1926     History Chief Complaint  Patient presents with   Failure To Thrive    Ian Weber is a 87 y.o. male.  Patient from home presents with chief complaint of decreased energy decreased appetite ongoing for the past 4 to 5 days.  Lives with his wife takes care of him.  She states that he is only eating a little bit of his grades and has decreased appetite.  Otherwise has a history of dementia ambulates with a walker at baseline.  No reports of fevers.  No reports of vomiting or diarrhea.  Positive cough for 3 to 4 days.      Past Medical History:  Diagnosis Date   Anxiety    Chronic venous stasis    Depression    Diverticulosis    Diverticulosis of colon (without mention of hemorrhage)    Esophageal reflux    GERD (gastroesophageal reflux disease)    Hiatal hernia    History of BPH    Hypothyroidism    Irritable bowel syndrome    Lactose intolerance    Stricture and stenosis of esophagus    Thyroid cancer ([facility] [place])    Vitamin D insufficiency     Patient Active Problem List   Diagnosis Date Noted   Anxiety 09/24/2017   GIB (gastrointestinal bleeding) 09/24/2017   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   Constipation 01/02/2012   BRBPR (bright red blood per rectum) 01/02/2012   VITAMIN D DEFICIENCY 10/22/2009   Diverticulosis of colon with hemorrhage 10/22/2009   ABDOMINAL PAIN, EPIGASTRIC 10/22/2009   THYROID CANCER, HX OF 10/22/2009   HYPOTHYROIDISM, POSTSURGICAL 10/20/2009   Essential hypertension 10/20/2009   ESOPHAGEAL STRICTURE 10/20/2009   GERD 10/20/2009   HIATAL HERNIA 10/20/2009    Past Surgical History:  Procedure Laterality Date   CARDIOVERSION  08/08/2012   Procedure: CARDIOVERSION;  Surgeon: Laverda Page, MD;  Location: [place];  Service: Cardiovascular;  Laterality: N/A;   CATARACT EXTRACTION     COLONOSCOPY     COLONOSCOPY WITH  PROPOFOL N/A 09/25/2017   Procedure: COLONOSCOPY WITH PROPOFOL;  Surgeon: Gatha Mayer, MD;  Location: [facility];  Service: Endoscopy;  Laterality: N/A;  moderate sedation acceptable also   HERNIA REPAIR  1992   THYROIDECTOMY  1990   Dr Deon Pilling       Family History  Family history unknown: Yes    Social History   Tobacco Use   Smoking status: Never   Smokeless tobacco: Never  Vaping Use   Vaping Use: Never used  Substance Use Topics   Alcohol use: No   Drug use: No    Home Medications Prior to Admission medications   Medication Sig Start Date End Date Taking? Authorizing Provider  aspirin 81 MG tablet Take 1 tablet (81 mg total) by mouth daily. 09/27/17  Yes Hosie Poisson, MD  furosemide (LASIX) 40 MG tablet Take 40 mg by mouth daily. 04/30/20  Yes [provider]  levothyroxine (SYNTHROID) 100 MCG tablet Take 100 mcg by mouth daily. 09/25/21  Yes [provider]  losartan (COZAAR) 25 MG tablet Take 25 mg by mouth daily. 05/04/20  Yes [provider]  nebivolol (BYSTOLIC) 2.5 MG tablet Take 2.5 mg by mouth daily.   Yes [provider]  polyethylene glycol powder (GLYCOLAX/MIRALAX) powder TAKE 17 G BY MOUTH 2 (TWO) TIMES DAILY. Patient not taking: Reported on 09/28/2021  09/28/17   Doran Stabler, MD  senna-docusate (SENOKOT-S) 8.6-50 MG tablet Take 1 tablet by mouth daily as needed for moderate constipation. Patient not taking: Reported on 09/28/2021 02/26/21   Isla Pence, MD    Allergies    Amlodipine, Chlorthalidone, Codeine phosphate, Hydrochlorothiazide w-triamterene, Prednisone, Sulfamethoxazole, and Triamterene  Review of Systems   Review of Systems  Constitutional:  Negative for fever.  HENT:  Negative for ear pain and sore throat.   Eyes:  Negative for pain.  Respiratory:  Positive for cough.   Cardiovascular:  Negative for chest pain.  Gastrointestinal:  Negative for abdominal pain.  Genitourinary:  Negative for  flank pain.  Musculoskeletal:  Negative for back pain.  Skin:  Negative for color change and rash.  Neurological:  Negative for syncope.  All other systems reviewed and are negative.  Physical Exam Updated Vital Signs BP (!) 142/74    Pulse (!) 58    Temp 98.2 F (36.8 C) (Oral)    Resp 14    SpO2 97%   Physical Exam Constitutional:      Appearance: He is well-developed.  HENT:     Head: Normocephalic.     Nose: Nose normal.  Eyes:     Extraocular Movements: Extraocular movements intact.  Cardiovascular:     Rate and Rhythm: Normal rate.  Pulmonary:     Effort: Pulmonary effort is normal.  Skin:    Coloration: Skin is not jaundiced.  Neurological:     General: No focal deficit present.     Mental Status: He is alert. Mental status is at baseline.     Comments: At his baseline per wife.  Moving all extremities.  Confused at baseline.    ED Results / Procedures / Treatments   Labs (all labs ordered are listed, but only abnormal results are displayed) Labs Reviewed  RESP PANEL BY RT-PCR (FLU A&B, COVID) ARPGX2 - Abnormal; Notable for the following components:      Result Value   SARS Coronavirus 2 by RT PCR POSITIVE (*)    All other components within normal limits  CBC WITH DIFFERENTIAL/PLATELET - Abnormal; Notable for the following components:   Neutro Abs 8.2 (*)    All other components within normal limits  COMPREHENSIVE METABOLIC PANEL - Abnormal; Notable for the following components:   Potassium 3.4 (*)    Glucose, Bld 100 (*)    Calcium 8.3 (*)    Albumin 3.2 (*)    All other components within normal limits  URINALYSIS, ROUTINE W REFLEX MICROSCOPIC - Abnormal; Notable for the following components:   APPearance HAZY (*)    Hgb urine dipstick MODERATE (*)    Protein, ur 30 (*)    All other components within normal limits  CULTURE, BLOOD (ROUTINE X 2)  CULTURE, BLOOD (ROUTINE X 2)  LACTIC ACID, PLASMA  LACTIC ACID, PLASMA    EKG None  Radiology DG Chest  Portable 1 View  Result Date: 09/28/2021 CLINICAL DATA:  Increasing confusion for to several weeks with cough, initial encounter EXAM: PORTABLE CHEST 1 VIEW COMPARISON:  09/04/2013 FINDINGS: Cardiac shadow is stable. Postsurgical changes in the neck are noted consistent with prior thyroidectomy. Lungs are clear bilaterally. No bony abnormality is seen. IMPRESSION: No active disease. Electronically Signed   By: Inez Catalina M.D.   On: 09/28/2021 20:50    Procedures Procedures   Medications Ordered in ED Medications  sodium chloride 0.9 % bolus 500 mL (0 mLs Intravenous Stopped 09/28/21 2141)  ED Course  I have reviewed the triage vital signs and the nursing notes.  Pertinent labs & imaging results that were available during my care of the patient were reviewed by me and considered in my medical decision making (see chart for details).    MDM Rules/Calculators/A&P                         Labs sent 6.  Chemistry remarkable white count normal bili normal urinalysis is negative.  COVID test is positive for COVID which is likely the cause of his symptoms.  Recommending hydration rest and follow-up with a primary care doctor via phone call within the next 2 to 3 days.  Advising immediate return for difficulty breathing worsening symptoms or any additional concerns.     Final Clinical Impression(s) / ED Diagnoses Final diagnoses:  COVID-19 virus infection    Rx / DC Orders ED Discharge Orders     None        Luna Fuse, MD 09/28/21 2321

## 2021-09-28 NOTE — ED Notes (Signed)
Pt sitting up in bed with eyes open. Responds to  verbal but still cant answer questions appropriately

## 2021-09-28 NOTE — ED Triage Notes (Signed)
Pt BIB [facility] from home .Per pt's wife,  Pt has been having decreased appetite & increased confusion for the past 2 weeks, however, worsened in the past 3 days. Pt has dementia at baseline. Pt does not have issues w urination. Pt's temp was 101.1 at home.   BP -supine 120/70       - sitting 98 palpated  HR 58 Spo2 96% RA Cbg 121

## 2021-09-28 NOTE — ED Notes (Signed)
[facility] CALLED, I WAS TOLD IT WILL BE A MOMENT.

## 2021-09-28 NOTE — ED Notes (Signed)
Lab at bedside

## 2021-09-28 NOTE — ED Notes (Signed)
Pt is solument. Pt will respond to verbal stimuli but will not answer any questions. Per family pt has been like this for at least a week or longer and has not eaten anything and that is why they called EMS

## 2021-09-28 NOTE — ED Notes (Signed)
While completing perineal care on pt prior to in and out cath pt started urinating. UA sample obtained this way instead of completing an in and out cath

## 2021-09-28 NOTE — Discharge Instructions (Addendum)
Call your primary care doctor or specialist as discussed in the next 2-3 days.   Return immediately back to the ER if:  Your symptoms worsen within the next 12-24 hours. You develop new symptoms such as new fevers, persistent vomiting, new pain, shortness of breath, or new weakness or numbness, or if you have any other concerns.  

## 2021-09-28 NOTE — ED Notes (Signed)
Verbal report given to Clara O RN at this time. Pt moved to room 14 at this time

## 2021-09-28 NOTE — ED Notes (Signed)
Radiology at bedside

## 2021-09-28 NOTE — ED Notes (Signed)
Provider at bedside

## 2021-09-29 MED ORDER — POTASSIUM CHLORIDE CRYS ER 20 MEQ PO TBCR: 40.0000 meq | EXTENDED_RELEASE_TABLET | Freq: Once | ORAL | Status: DC

## 2021-10-03 LAB — CULTURE, BLOOD (ROUTINE X 2)
Culture: NO GROWTH
Culture: NO GROWTH

## 2021-10-04 ENCOUNTER — Encounter (HOSPITAL_COMMUNITY): Payer: Self-pay

## 2021-10-04 ENCOUNTER — Emergency Department (HOSPITAL_COMMUNITY): Payer: Medicare HMO

## 2021-10-04 ENCOUNTER — Inpatient Hospital Stay (HOSPITAL_COMMUNITY)
Admission: EM | Admit: 2021-10-04 | Discharge: 2021-10-22 | DRG: 177 | Disposition: A | Discharge status: Hospice | Payer: Medicare HMO | Attending: Internal Medicine | Admitting: Internal Medicine

## 2021-10-04 ENCOUNTER — Other Ambulatory Visit: Payer: Self-pay

## 2021-10-04 DIAGNOSIS — L89323 Pressure ulcer of left buttock, stage 3: Secondary | ICD-10-CM | POA: Diagnosis not present

## 2021-10-04 DIAGNOSIS — E876 Hypokalemia: Secondary | ICD-10-CM | POA: Diagnosis not present

## 2021-10-04 DIAGNOSIS — R627 Adult failure to thrive: Secondary | ICD-10-CM | POA: Diagnosis present

## 2021-10-04 DIAGNOSIS — E890 Postprocedural hypothyroidism: Secondary | ICD-10-CM | POA: Diagnosis present

## 2021-10-04 DIAGNOSIS — G9349 Other encephalopathy: Secondary | ICD-10-CM | POA: Diagnosis present

## 2021-10-04 DIAGNOSIS — U071 COVID-19: Principal | ICD-10-CM

## 2021-10-04 DIAGNOSIS — E860 Dehydration: Secondary | ICD-10-CM | POA: Diagnosis present

## 2021-10-04 DIAGNOSIS — F419 Anxiety disorder, unspecified: Secondary | ICD-10-CM | POA: Diagnosis present

## 2021-10-04 DIAGNOSIS — L89322 Pressure ulcer of left buttock, stage 2: Secondary | ICD-10-CM | POA: Diagnosis present

## 2021-10-04 DIAGNOSIS — G9340 Encephalopathy, unspecified: Secondary | ICD-10-CM | POA: Diagnosis not present

## 2021-10-04 DIAGNOSIS — I10 Essential (primary) hypertension: Secondary | ICD-10-CM | POA: Diagnosis present

## 2021-10-04 DIAGNOSIS — Z79890 Hormone replacement therapy (postmenopausal): Secondary | ICD-10-CM

## 2021-10-04 DIAGNOSIS — E870 Hyperosmolality and hypernatremia: Secondary | ICD-10-CM | POA: Diagnosis present

## 2021-10-04 DIAGNOSIS — F05 Delirium due to known physiological condition: Secondary | ICD-10-CM | POA: Diagnosis not present

## 2021-10-04 DIAGNOSIS — L89152 Pressure ulcer of sacral region, stage 2: Secondary | ICD-10-CM | POA: Diagnosis present

## 2021-10-04 DIAGNOSIS — Z85850 Personal history of malignant neoplasm of thyroid: Secondary | ICD-10-CM

## 2021-10-04 DIAGNOSIS — R319 Hematuria, unspecified: Secondary | ICD-10-CM | POA: Diagnosis not present

## 2021-10-04 DIAGNOSIS — R32 Unspecified urinary incontinence: Secondary | ICD-10-CM | POA: Diagnosis not present

## 2021-10-04 DIAGNOSIS — J690 Pneumonitis due to inhalation of food and vomit: Secondary | ICD-10-CM | POA: Diagnosis present

## 2021-10-04 DIAGNOSIS — J9601 Acute respiratory failure with hypoxia: Secondary | ICD-10-CM | POA: Diagnosis present

## 2021-10-04 DIAGNOSIS — F0390 Unspecified dementia without behavioral disturbance: Secondary | ICD-10-CM

## 2021-10-04 DIAGNOSIS — Z66 Do not resuscitate: Secondary | ICD-10-CM | POA: Diagnosis present

## 2021-10-04 DIAGNOSIS — I2693 Single subsegmental pulmonary embolism without acute cor pulmonale: Secondary | ICD-10-CM | POA: Diagnosis present

## 2021-10-04 DIAGNOSIS — E873 Alkalosis: Secondary | ICD-10-CM | POA: Diagnosis present

## 2021-10-04 DIAGNOSIS — F32A Depression, unspecified: Secondary | ICD-10-CM | POA: Diagnosis present

## 2021-10-04 DIAGNOSIS — R400 Somnolence: Secondary | ICD-10-CM | POA: Diagnosis not present

## 2021-10-04 DIAGNOSIS — R778 Other specified abnormalities of plasma proteins: Secondary | ICD-10-CM | POA: Diagnosis not present

## 2021-10-04 DIAGNOSIS — Z888 Allergy status to other drugs, medicaments and biological substances status: Secondary | ICD-10-CM

## 2021-10-04 DIAGNOSIS — K219 Gastro-esophageal reflux disease without esophagitis: Secondary | ICD-10-CM | POA: Diagnosis present

## 2021-10-04 DIAGNOSIS — R451 Restlessness and agitation: Secondary | ICD-10-CM | POA: Diagnosis not present

## 2021-10-04 DIAGNOSIS — Z4659 Encounter for fitting and adjustment of other gastrointestinal appliance and device: Secondary | ICD-10-CM

## 2021-10-04 DIAGNOSIS — K449 Diaphragmatic hernia without obstruction or gangrene: Secondary | ICD-10-CM | POA: Diagnosis present

## 2021-10-04 DIAGNOSIS — E871 Hypo-osmolality and hyponatremia: Secondary | ICD-10-CM | POA: Diagnosis present

## 2021-10-04 DIAGNOSIS — I878 Other specified disorders of veins: Secondary | ICD-10-CM | POA: Diagnosis present

## 2021-10-04 DIAGNOSIS — L89001 Pressure ulcer of unspecified elbow, stage 1: Secondary | ICD-10-CM | POA: Diagnosis not present

## 2021-10-04 DIAGNOSIS — R159 Full incontinence of feces: Secondary | ICD-10-CM | POA: Diagnosis not present

## 2021-10-04 DIAGNOSIS — Z515 Encounter for palliative care: Secondary | ICD-10-CM | POA: Diagnosis not present

## 2021-10-04 DIAGNOSIS — I248 Other forms of acute ischemic heart disease: Secondary | ICD-10-CM | POA: Diagnosis present

## 2021-10-04 DIAGNOSIS — L89312 Pressure ulcer of right buttock, stage 2: Secondary | ICD-10-CM | POA: Diagnosis present

## 2021-10-04 DIAGNOSIS — Z7982 Long term (current) use of aspirin: Secondary | ICD-10-CM

## 2021-10-04 DIAGNOSIS — R404 Transient alteration of awareness: Secondary | ICD-10-CM | POA: Diagnosis not present

## 2021-10-04 DIAGNOSIS — R7989 Other specified abnormal findings of blood chemistry: Secondary | ICD-10-CM

## 2021-10-04 DIAGNOSIS — Z781 Physical restraint status: Secondary | ICD-10-CM

## 2021-10-04 DIAGNOSIS — R1310 Dysphagia, unspecified: Secondary | ICD-10-CM | POA: Diagnosis present

## 2021-10-04 DIAGNOSIS — E559 Vitamin D deficiency, unspecified: Secondary | ICD-10-CM | POA: Diagnosis present

## 2021-10-04 DIAGNOSIS — R4182 Altered mental status, unspecified: Secondary | ICD-10-CM | POA: Diagnosis present

## 2021-10-04 DIAGNOSIS — Z885 Allergy status to narcotic agent status: Secondary | ICD-10-CM

## 2021-10-04 DIAGNOSIS — F0392 Unspecified dementia, unspecified severity, with psychotic disturbance: Secondary | ICD-10-CM | POA: Diagnosis not present

## 2021-10-04 DIAGNOSIS — Z79899 Other long term (current) drug therapy: Secondary | ICD-10-CM

## 2021-10-04 DIAGNOSIS — R0989 Other specified symptoms and signs involving the circulatory and respiratory systems: Secondary | ICD-10-CM

## 2021-10-04 DIAGNOSIS — L249 Irritant contact dermatitis, unspecified cause: Secondary | ICD-10-CM | POA: Diagnosis not present

## 2021-10-04 DIAGNOSIS — Z7189 Other specified counseling: Secondary | ICD-10-CM | POA: Diagnosis not present

## 2021-10-04 DIAGNOSIS — Z7401 Bed confinement status: Secondary | ICD-10-CM

## 2021-10-04 DIAGNOSIS — L8990 Pressure ulcer of unspecified site, unspecified stage: Secondary | ICD-10-CM | POA: Insufficient documentation

## 2021-10-04 DIAGNOSIS — N400 Enlarged prostate without lower urinary tract symptoms: Secondary | ICD-10-CM | POA: Diagnosis present

## 2021-10-04 LAB — CBC WITH DIFFERENTIAL/PLATELET
Abs Immature Granulocytes: 0.09 10*3/uL — ABNORMAL HIGH (ref 0.00–0.07)
Basophils Absolute: 0 10*3/uL (ref 0.0–0.1)
Basophils Relative: 0 %
Eosinophils Absolute: 0 10*3/uL (ref 0.0–0.5)
Eosinophils Relative: 0 %
HCT: 51.5 % (ref 39.0–52.0)
Hemoglobin: 16.2 g/dL (ref 13.0–17.0)
Immature Granulocytes: 1 %
Lymphocytes Relative: 8 %
Lymphs Abs: 0.9 10*3/uL (ref 0.7–4.0)
MCH: 29.8 pg (ref 26.0–34.0)
MCHC: 31.5 g/dL (ref 30.0–36.0)
MCV: 94.7 fL (ref 80.0–100.0)
Monocytes Absolute: 0.7 10*3/uL (ref 0.1–1.0)
Monocytes Relative: 6 %
Neutro Abs: 8.6 10*3/uL — ABNORMAL HIGH (ref 1.7–7.7)
Neutrophils Relative %: 85 %
Platelets: 359 10*3/uL (ref 150–400)
RBC: 5.44 MIL/uL (ref 4.22–5.81)
RDW: 12.8 % (ref 11.5–15.5)
WBC: 10.3 10*3/uL (ref 4.0–10.5)
nRBC: 0 % (ref 0.0–0.2)

## 2021-10-04 LAB — BLOOD GAS, VENOUS
Acid-Base Excess: 4.2 mmol/L — ABNORMAL HIGH (ref 0.0–2.0)
Bicarbonate: 31.2 mmol/L — ABNORMAL HIGH (ref 20.0–28.0)
O2 Saturation: 27.6 %
Patient temperature: 98.6
pCO2, Ven: 56.8 mmHg (ref 44.0–60.0)
pH, Ven: 7.358 (ref 7.250–7.430)
pO2, Ven: <31 mmHg — CL (ref 32.0–45.0)

## 2021-10-04 LAB — TROPONIN I (HIGH SENSITIVITY)
Troponin I (High Sensitivity): 4339 ng/L (ref <18)
Troponin I (High Sensitivity): 2885 ng/L (ref <18)

## 2021-10-04 LAB — COMPREHENSIVE METABOLIC PANEL
ALT: 20 U/L (ref 0–44)
AST: 33 U/L (ref 15–41)
Albumin: 4 g/dL (ref 3.5–5.0)
Alkaline Phosphatase: 87 U/L (ref 38–126)
Anion gap: 13 (ref 5–15)
BUN: 55 mg/dL — ABNORMAL HIGH (ref 8–23)
CO2: 27 mmol/L (ref 22–32)
Calcium: 9.4 mg/dL (ref 8.9–10.3)
Chloride: 108 mmol/L (ref 98–111)
Creatinine, Ser: 1.01 mg/dL (ref 0.61–1.24)
GFR, Estimated: >60 mL/min (ref >60)
Glucose, Bld: 116 mg/dL — ABNORMAL HIGH (ref 70–99)
Potassium: 3.9 mmol/L (ref 3.5–5.1)
Sodium: 148 mmol/L — ABNORMAL HIGH (ref 135–145)
Total Bilirubin: 1 mg/dL (ref 0.3–1.2)
Total Protein: 9 g/dL — ABNORMAL HIGH (ref 6.5–8.1)

## 2021-10-04 LAB — LACTIC ACID, PLASMA
Lactic Acid, Venous: 2 mmol/L (ref 0.5–1.9)
Lactic Acid, Venous: 2.1 mmol/L (ref 0.5–1.9)

## 2021-10-04 LAB — TSH: TSH: 1.234 u[IU]/mL (ref 0.350–4.500)

## 2021-10-04 MED ORDER — SODIUM CHLORIDE 0.9 % IV SOLN: INTRAVENOUS | Status: AC

## 2021-10-04 MED ORDER — NIRMATRELVIR/RITONAVIR (PAXLOVID)TABLET
3.0000 | ORAL_TABLET | Freq: Two times a day (BID) | ORAL | Status: DC
  Filled 2021-10-04: qty 30

## 2021-10-04 MED ORDER — SODIUM CHLORIDE 0.9 % IV BOLUS
1000.0000 mL | Freq: Once | INTRAVENOUS | Status: AC
  Administered 2021-10-04: 17:00:00 1000 mL via INTRAVENOUS

## 2021-10-04 MED ORDER — SODIUM CHLORIDE 0.9 % IV SOLN
100.0000 mg | Freq: Once | INTRAVENOUS | Status: AC
  Administered 2021-10-04: 23:00:00 100 mg via INTRAVENOUS
  Filled 2021-10-04: qty 20

## 2021-10-04 MED ORDER — ENOXAPARIN SODIUM 40 MG/0.4ML IJ SOSY
40.0000 mg | PREFILLED_SYRINGE | INTRAMUSCULAR | Status: DC
  Administered 2021-10-05: 06:00:00 40 mg via SUBCUTANEOUS
  Filled 2021-10-04: qty 0.4

## 2021-10-04 MED ORDER — SODIUM CHLORIDE 0.9 % IV SOLN
100.0000 mg | Freq: Once | INTRAVENOUS | Status: AC
  Administered 2021-10-05: 100 mg via INTRAVENOUS
  Filled 2021-10-04: qty 20

## 2021-10-04 MED ORDER — SODIUM CHLORIDE 0.9 % IV SOLN
100.0000 mg | Freq: Every day | INTRAVENOUS | Status: AC
  Administered 2021-10-05 – 2021-10-08 (×4): 100 mg via INTRAVENOUS
  Filled 2021-10-04 (×4): qty 20

## 2021-10-04 NOTE — ED Triage Notes (Signed)
Unable to complete screening questions, pt is minimally responsive.

## 2021-10-04 NOTE — Progress Notes (Signed)
87 y/o [place] male (last seen by Dr Einar Gip in 2014) with h/o thyroid cancer, hypothyroidism, remote h/o Afib, not on anticoagulation, dementia, recent COVID, now with decreased responsiveness and encephalopathy. Lactic acid 2, Na 148. Most likely dehydrated. Incidental finding of trop 4500. EKG with no acute ischemia. Presentation not consistent with ACS, likely demand ischemia. Recommend rehydration and w/u for encephalopathy as per the primary team. I would not use heparin unless ischemic changes on EKG or large delta in trop. Formal consult in AM.    Nigel Mormon, MD Pager: [phone] Office: [phone]

## 2021-10-04 NOTE — ED Provider Notes (Signed)
[facility] DEPT Provider Note   CSN: [id number] Arrival date & time: 10/04/21  1515     History Chief Complaint  Patient presents with   Altered Mental Status    Ian Weber is a 87 y.o. male.  Patient with history of dementia, lives at home with family currently, recent ED visit and diagnosis of COVID-19 --presents to the emergency department for decreased responsiveness, altered mental status.  Initial history from EMS as family is not at bedside.  Level 5 caveat due to dementia.  Patient was seen at [facility] 6 days ago.  Work-up there revealed positive COVID-19, otherwise unremarkable work-up.  Patient was discharged to home.  Presents today for continued decreased responsiveness.  Per EMS report last month, patient was ambulatory with a walker and alert, although not currently.  Additional history obtained from family on arrival.  Patient typically ambulates a bit with a walker.  After arriving home from his ED visit he continued to be weak.  He has been in bed for the past several days.  He has barely had anything to eat or drink.  Yesterday he had a few bites of a pancake and a little water to take his thyroid medication.  Other than that, no pain reported unless trying to move his legs.  Patient is not able to participate in ADLs like he typically could before.        Past Medical History:  Diagnosis Date   Anxiety    Chronic venous stasis    Depression    Diverticulosis    Diverticulosis of colon (without mention of hemorrhage)    Esophageal reflux    GERD (gastroesophageal reflux disease)    Hiatal hernia    History of BPH    Hypothyroidism    Irritable bowel syndrome    Lactose intolerance    Stricture and stenosis of esophagus    Thyroid cancer ([place])    Vitamin D insufficiency     Patient Active Problem List   Diagnosis Date Noted   Anxiety 09/24/2017   GIB (gastrointestinal bleeding) 09/24/2017   Radial nerve palsy  05/09/2016   Constipation 01/02/2012   BRBPR (bright red blood per rectum) 01/02/2012   VITAMIN D DEFICIENCY 10/22/2009   Diverticulosis of colon with hemorrhage 10/22/2009   ABDOMINAL PAIN, EPIGASTRIC 10/22/2009   THYROID CANCER, HX OF 10/22/2009   HYPOTHYROIDISM, POSTSURGICAL 10/20/2009   Essential hypertension 10/20/2009   ESOPHAGEAL STRICTURE 10/20/2009   GERD 10/20/2009   HIATAL HERNIA 10/20/2009    Past Surgical History:  Procedure Laterality Date   CARDIOVERSION  08/08/2012   Procedure: CARDIOVERSION;  Surgeon: Laverda Page, MD;  Location: [place];  Service: Cardiovascular;  Laterality: N/A;   CATARACT EXTRACTION     COLONOSCOPY     COLONOSCOPY WITH PROPOFOL N/A 09/25/2017   Procedure: COLONOSCOPY WITH PROPOFOL;  Surgeon: Gatha Mayer, MD;  Location: [facility];  Service: Endoscopy;  Laterality: N/A;  moderate sedation acceptable also   HERNIA REPAIR  1992   THYROIDECTOMY  1990   Dr Deon Pilling       Family History  Family history unknown: Yes    Social History   Tobacco Use   Smoking status: Never   Smokeless tobacco: Never  Vaping Use   Vaping Use: Never used  Substance Use Topics   Alcohol use: No   Drug use: No    Home Medications Prior to Admission medications   Medication Sig Start Date End Date Taking? Authorizing  Provider  aspirin 81 MG tablet Take 1 tablet (81 mg total) by mouth daily. 09/27/17   Hosie Poisson, MD  furosemide (LASIX) 40 MG tablet Take 40 mg by mouth daily. 04/30/20   [provider]  levothyroxine (SYNTHROID) 100 MCG tablet Take 100 mcg by mouth daily. 09/25/21   [provider]  losartan (COZAAR) 25 MG tablet Take 25 mg by mouth daily. 05/04/20   [provider]  nebivolol (BYSTOLIC) 2.5 MG tablet Take 2.5 mg by mouth daily.    [provider]  polyethylene glycol powder (GLYCOLAX/MIRALAX) powder TAKE 17 G BY MOUTH 2 (TWO) TIMES DAILY. Patient not taking: Reported on 09/28/2021 09/28/17    Doran Stabler, MD  senna-docusate (SENOKOT-S) 8.6-50 MG tablet Take 1 tablet by mouth daily as needed for moderate constipation. Patient not taking: Reported on 09/28/2021 02/26/21   Isla Pence, MD    Allergies    Amlodipine, Chlorthalidone, Codeine phosphate, Hydrochlorothiazide w-triamterene, Prednisone, Sulfamethoxazole, and Triamterene  Review of Systems   Review of Systems  Unable to perform ROS: Dementia   Physical Exam Updated Vital Signs BP (!) 110/96 (BP Location: Left Arm)    Pulse 86    Temp 98 F (36.7 C) (Axillary)    Resp 18    Ht 6' (1.829 m)    Wt 72.6 kg    SpO2 94%    BMI 21.70 kg/m   Physical Exam Vitals and nursing note reviewed.  Constitutional:      General: He is not in acute distress.    Appearance: He is well-developed.  HENT:     Head: Normocephalic and atraumatic.     Right Ear: External ear normal.     Left Ear: External ear normal.     Nose: No congestion.     Mouth/Throat:     Mouth: Mucous membranes are dry.     Comments: Very dry mucous membranes Eyes:     General:        Right eye: No discharge.        Left eye: No discharge.     Conjunctiva/sclera: Conjunctivae normal.  Cardiovascular:     Rate and Rhythm: Normal rate and regular rhythm.     Heart sounds: Normal heart sounds.  Pulmonary:     Effort: Pulmonary effort is normal.     Breath sounds: Rhonchi present. No wheezing or rales.  Abdominal:     Palpations: Abdomen is soft.     Tenderness: There is no abdominal tenderness. There is no guarding or rebound.  Musculoskeletal:     Cervical back: Normal range of motion and neck supple.  Skin:    General: Skin is warm and dry.  Neurological:     Mental Status: He is lethargic.     GCS: GCS eye subscore is 2. GCS verbal subscore is 3. GCS motor subscore is 5.     Comments: Patient uncooperative with more extensive neuro exam.    ED Results / Procedures / Treatments   Labs (all labs ordered are listed, but only abnormal  results are displayed) Labs Reviewed  CBC WITH DIFFERENTIAL/PLATELET - Abnormal; Notable for the following components:      Result Value   Neutro Abs 8.6 (*)    Abs Immature Granulocytes 0.09 (*)    All other components within normal limits  COMPREHENSIVE METABOLIC PANEL - Abnormal; Notable for the following components:   Sodium 148 (*)    Glucose, Bld 116 (*)    BUN 55 (*)  Total Protein 9.0 (*)    All other components within normal limits  LACTIC ACID, PLASMA - Abnormal; Notable for the following components:   Lactic Acid, Venous 2.0 (*)    All other components within normal limits  BLOOD GAS, VENOUS - Abnormal; Notable for the following components:   pO2, Ven <31.0 (*)    Bicarbonate 31.2 (*)    Acid-Base Excess 4.2 (*)    All other components within normal limits  TROPONIN I (HIGH SENSITIVITY) - Abnormal; Notable for the following components:   Troponin I (High Sensitivity) 4,339 (*)    All other components within normal limits  TSH  URINALYSIS, ROUTINE W REFLEX MICROSCOPIC    EKG EKG Interpretation  Date/Time:  Tuesday October 04 2021 15:58:16 EST Ventricular Rate:  86 PR Interval:  125 QRS Duration: 142 QT Interval:  419 QTC Calculation: 502 R Axis:   -84 Text Interpretation: Sinus rhythm RBBB and LAFB Non-specific ST-t changes Confirmed by Lajean Saver [phone]) on 10/04/2021 5:11:44 PM  Radiology CT Head Wo Contrast  Result Date: 10/04/2021 CLINICAL DATA:  87 year old male with a history of mental status changes EXAM: CT HEAD WITHOUT CONTRAST TECHNIQUE: Contiguous axial images were obtained from the base of the skull through the vertex without intravenous contrast. COMPARISON:  None. FINDINGS: Brain: No acute intracranial hemorrhage. No midline shift or mass effect. Gray-white differentiation maintained. Confluent hypodensity in the bilateral periventricular white matter predominantly posteriorly. Unremarkable appearance of the ventricular system. Vascular:  Calcifications of the intracranial vasculature. Skull: No acute fracture.  No aggressive bone lesion identified. Sinuses/Orbits: Unremarkable appearance of the orbits. Mastoid air cells clear. No middle ear effusion. No significant sinus disease. Other: None IMPRESSION: Negative for acute intracranial abnormality. Changes of chronic microvascular ischemic disease and associated intracranial atherosclerosis Electronically Signed   By: Corrie Mckusick D.O.   On: 10/04/2021 17:12   DG Chest Port 1 View  Result Date: 10/04/2021 CLINICAL DATA:  Altered mental status COVID EXAM: PORTABLE CHEST 1 VIEW COMPARISON:  09/28/2021 FINDINGS: Postsurgical changes at the thoracic inlet. No focal opacity, pleural effusion or pneumothorax. Normal cardiomediastinal silhouette. IMPRESSION: No active disease. Electronically Signed   By: Donavan Foil M.D.   On: 10/04/2021 17:21    Procedures Procedures   Medications Ordered in ED Medications  sodium chloride 0.9 % bolus 1,000 mL (1,000 mLs Intravenous New Bag/Given 10/04/21 1642)    ED Course  I have reviewed the triage vital signs and the nursing notes.  Pertinent labs & imaging results that were available during my care of the patient were reviewed by me and considered in my medical decision making (see chart for details).  Patient seen and examined. Plan discussed with patient.   Labs: CBC, CMP, UA, troponin, lactate, TSH  Imaging: Chest x-ray  Medications/Fluids: Fluid bolus  Vital signs reviewed and are as follows: BP (!) 110/96 (BP Location: Left Arm)    Pulse 86    Temp 98 F (36.7 C) (Axillary)    Resp 18    Ht 6' (1.829 m)    Wt 72.6 kg    SpO2 94%    BMI 21.70 kg/m   Initial impression: Decline, partially related to COVID-19, but cannot rule out other metabolic cause  9:73 PM Patient discussed with Dr. Ashok Cordia earlier.   Patient's wife Cally is now at bedside.  Exam is unchanged.  Awaiting additional work-up.  5:52 PM Troponin 4339. EKG  unchanged today.  6:18 PM family updated.  Patient has seen Dr. Einar Gip  in the past.  I spoke with Dr. Virgina Jock who is on.  Recommendations: Low likelihood of intervention and low suspicion for ACS.  Would recheck troponin in the morning.  Would only give heparin if troponins are elevating or if EKG has changes.  Would prefer admission to [facility] if possible, but if no bed availability [facility] is okay.  Plan for consult in the morning.   Will request hospitalist admission.  7:03 PM Discussed case with Dr. Flossie Buffy of [facility] who will see.   CRITICAL CARE Performed by: Carlisle Cater PA-C Total critical care time: 40 minutes Critical care time was exclusive of separately billable procedures and treating other patients. Critical care was necessary to treat or prevent imminent or life-threatening deterioration. Critical care was time spent personally by me on the following activities: development of treatment plan with patient and/or surrogate as well as nursing, discussions with consultants, evaluation of patient's response to treatment, examination of patient, obtaining history from patient or surrogate, ordering and performing treatments and interventions, ordering and review of laboratory studies, ordering and review of radiographic studies, pulse oximetry and re-evaluation of patient's condition.     MDM Rules/Calculators/A&P                          Admit.       Final Clinical Impression(s) / ED Diagnoses Final diagnoses:  Encephalopathy acute  Elevated troponin  Dehydration  COVID-19    Rx / DC Orders ED Discharge Orders     None        Carlisle Cater, Hershal Coria 10/04/21 1904    Lajean Saver, MD 10/04/21 1925

## 2021-10-04 NOTE — H&P (Incomplete)
H&P      History and Physical    Ian Weber FXT:[id number] DOB: [birth year] DOA: 10/04/2021  PCP: Jilda Panda, MD  Patient coming from: Home  I have personally briefly reviewed patient's old medical records in [place]  Chief Complaint: Weakness, altered mental status.   HPI: Ian Weber is a 87 y.o. male with medical history significant for dementia, hypertension, hypothyroidism history of GI bleed who presents with concerns of increasing altered mental status.   Wife at bedside provides history as patient only responsive to noxious stimuli. He tested positive for COVID about 6 days ago and since then has had decreased appetite.  Usually at baseline he is able to help wife with transfer but for the last couple days has become increasingly more bedbound.  Patient has been grunting, not able to carry conversation and not opening his eyes. Has not complaint of pain but wife noticed increase labored respiration.   ED Course: ***  Review of Systems:  Social Pt lives with his wife who does all her his ADLS. He is only able to help her with transfer from bed to chair. Usually alert but not oriented at baseline with his demential. No tobacco, alcohol or illicit drug use.   Past Medical History:  Diagnosis Date   Anxiety    Chronic venous stasis    Depression    Diverticulosis    Diverticulosis of colon (without mention of hemorrhage)    Esophageal reflux    GERD (gastroesophageal reflux disease)    Hiatal hernia    History of BPH    Hypothyroidism    Irritable bowel syndrome    Lactose intolerance    Stricture and stenosis of esophagus    Thyroid cancer ([place])    Vitamin D insufficiency     Past Surgical History:  Procedure Laterality Date   CARDIOVERSION  08/08/2012   Procedure: CARDIOVERSION;  Surgeon: Laverda Page, MD;  Location: [facility];  Service: Cardiovascular;  Laterality: N/A;   CATARACT EXTRACTION     COLONOSCOPY     COLONOSCOPY WITH PROPOFOL N/A  09/25/2017   Procedure: COLONOSCOPY WITH PROPOFOL;  Surgeon: Gatha Mayer, MD;  Location: [facility];  Service: Endoscopy;  Laterality: N/A;  moderate sedation acceptable also   HERNIA REPAIR  1992   THYROIDECTOMY  1990   Dr Deon Pilling     reports that he has never smoked. He has never used smokeless tobacco. He reports that he does not drink alcohol and does not use drugs. Social History  Allergies  Allergen Reactions   Amlodipine Other (See Comments)    Reaction not recalled by patient   Chlorthalidone Other (See Comments)    Reaction not recalled by patient   Codeine Phosphate Other (See Comments)    Weakness, sweating   Hydrochlorothiazide W-Triamterene Other (See Comments)    Reaction not recalled by patient   Prednisone Itching   Sulfamethoxazole Itching   Triamterene Other (See Comments)    Reaction not recalled by patient    Family History  Family history unknown: Yes     Prior to Admission medications   Medication Sig Start Date End Date Taking? Authorizing Provider  aspirin 81 MG tablet Take 1 tablet (81 mg total) by mouth daily. 09/27/17  Yes Hosie Poisson, MD  furosemide (LASIX) 40 MG tablet Take 40 mg by mouth daily. 04/30/20  Yes [provider]  levothyroxine (SYNTHROID) 100 MCG tablet Take 100 mcg by mouth daily. 09/25/21  Yes [provider]  losartan (COZAAR) 25 MG tablet Take 25 mg by mouth daily. 05/04/20  Yes [provider]  nebivolol (BYSTOLIC) 2.5 MG tablet Take 2.5 mg by mouth daily.   Yes [provider]  polyethylene glycol powder (GLYCOLAX/MIRALAX) powder TAKE 17 G BY MOUTH 2 (TWO) TIMES DAILY. Patient not taking: Reported on 09/28/2021 09/28/17   Doran Stabler, MD  senna-docusate (SENOKOT-S) 8.6-50 MG tablet Take 1 tablet by mouth daily as needed for moderate constipation. Patient not taking: Reported on 09/28/2021 02/26/21   Isla Pence, MD    Physical Exam: Vitals:   10/04/21 1537 10/04/21 1538  10/04/21 1625 10/04/21 1820  BP: (!) 110/96  107/77 105/83  Pulse: 86  85 84  Resp: 18  18 12   Temp: 98 F (36.7 C)  98.8 F (37.1 C)   TempSrc: Axillary  Rectal   SpO2: 94%  100% 93%  Weight:  72.6 kg    Height:  6' (1.829 m)      Constitutional: NAD, calm, comfortable Vitals:   10/04/21 1537 10/04/21 1538 10/04/21 1625 10/04/21 1820  BP: (!) 110/96  107/77 105/83  Pulse: 86  85 84  Resp: 18  18 12   Temp: 98 F (36.7 C)  98.8 F (37.1 C)   TempSrc: Axillary  Rectal   SpO2: 94%  100% 93%  Weight:  72.6 kg    Height:  6' (1.829 m)     Eyes: PERRL, lids and conjunctivae normal ENMT: Mucous membranes are moist. Posterior pharynx clear of any exudate or lesions.Normal dentition.  Neck: normal, supple, no masses, no thyromegaly Respiratory: clear to auscultation bilaterally, no wheezing, no crackles. Normal respiratory effort. No accessory muscle use.  Cardiovascular: Regular rate and rhythm, no murmurs / rubs / gallops. No extremity edema. 2+ pedal pulses. No carotid bruits.  Abdomen: no tenderness, no masses palpated. No hepatosplenomegaly. Bowel sounds positive.  Musculoskeletal: no clubbing / cyanosis. No joint deformity upper and lower extremities. Good ROM, no contractures. Normal muscle tone.  Skin: no rashes, lesions, ulcers. No induration Neurologic: CN 2-12 grossly intact. Sensation intact, DTR normal. Strength 5/5 in all 4.  Psychiatric: Normal judgment and insight. Alert and oriented x 3. Normal mood.   (Anything < 9 systems with 2 bullets each down codes to level 1) (If patient refuses exam cant bill higher level) (Make sure to document decubitus ulcers present on admission -- if possible -- and whether patient has chronic indwelling catheter at time of admission)  Labs on Admission: I have personally reviewed following labs and imaging studies  CBC: Recent Labs  Lab 09/28/21 2030 10/04/21 1548  WBC 10.4 10.3  NEUTROABS 8.2* 8.6*  HGB 13.2 16.2  HCT 40.2  51.5  MCV 92.4 94.7  PLT 174 160   Basic Metabolic Panel: Recent Labs  Lab 09/28/21 2030 10/04/21 1548  NA 140 148*  K 3.4* 3.9  CL 104 108  CO2 27 27  GLUCOSE 100* 116*  BUN 20 55*  CREATININE 0.80 1.01  CALCIUM 8.3* 9.4   GFR: Estimated Creatinine Clearance: 54.9 mL/min (by C-G formula based on SCr of 1.01 mg/dL). Liver Function Tests: Recent Labs  Lab 09/28/21 2030 10/04/21 1548  AST 32 33  ALT 17 20  ALKPHOS 73 87  BILITOT 1.0 1.0  PROT 6.9 9.0*  ALBUMIN 3.2* 4.0   No results for input(s): LIPASE, AMYLASE in the last 168 hours. No results for input(s): AMMONIA in the last 168 hours. Coagulation Profile: No results  for input(s): INR, PROTIME in the last 168 hours. Cardiac Enzymes: No results for input(s): CKTOTAL, CKMB, CKMBINDEX, TROPONINI in the last 168 hours. BNP (last 3 results) No results for input(s): PROBNP in the last 8760 hours. HbA1C: No results for input(s): HGBA1C in the last 72 hours. CBG: No results for input(s): GLUCAP in the last 168 hours. Lipid Profile: No results for input(s): CHOL, HDL, LDLCALC, TRIG, CHOLHDL, LDLDIRECT in the last 72 hours. Thyroid Function Tests: Recent Labs    10/04/21 1549  TSH 1.234   Anemia Panel: No results for input(s): VITAMINB12, FOLATE, FERRITIN, TIBC, IRON, RETICCTPCT in the last 72 hours. Urine analysis:    Component Value Date/Time   COLORURINE YELLOW 09/28/2021 2030   APPEARANCEUR HAZY (A) 09/28/2021 2030   LABSPEC 1.019 09/28/2021 2030   PHURINE 5.0 09/28/2021 2030   GLUCOSEU NEGATIVE 09/28/2021 2030   HGBUR MODERATE (A) 09/28/2021 2030   [place] NEGATIVE 09/28/2021 2030   [place] 09/28/2021 2030   PROTEINUR 30 (A) 09/28/2021 2030   UROBILINOGEN 0.2 11/19/2012 1138   NITRITE NEGATIVE 09/28/2021 2030   LEUKOCYTESUR NEGATIVE 09/28/2021 2030    Radiological Exams on Admission: CT Head Wo Contrast  Result Date: 10/04/2021 CLINICAL DATA:  87 year old male with a history of  mental status changes EXAM: CT HEAD WITHOUT CONTRAST TECHNIQUE: Contiguous axial images were obtained from the base of the skull through the vertex without intravenous contrast. COMPARISON:  None. FINDINGS: Brain: No acute intracranial hemorrhage. No midline shift or mass effect. Gray-white differentiation maintained. Confluent hypodensity in the bilateral periventricular white matter predominantly posteriorly. Unremarkable appearance of the ventricular system. Vascular: Calcifications of the intracranial vasculature. Skull: No acute fracture.  No aggressive bone lesion identified. Sinuses/Orbits: Unremarkable appearance of the orbits. Mastoid air cells clear. No middle ear effusion. No significant sinus disease. Other: None IMPRESSION: Negative for acute intracranial abnormality. Changes of chronic microvascular ischemic disease and associated intracranial atherosclerosis Electronically Signed   By: Corrie Mckusick D.O.   On: 10/04/2021 17:12   DG Chest Port 1 View  Result Date: 10/04/2021 CLINICAL DATA:  Altered mental status COVID EXAM: PORTABLE CHEST 1 VIEW COMPARISON:  09/28/2021 FINDINGS: Postsurgical changes at the thoracic inlet. No focal opacity, pleural effusion or pneumothorax. Normal cardiomediastinal silhouette. IMPRESSION: No active disease. Electronically Signed   By: Donavan Foil M.D.   On: 10/04/2021 17:21      Assessment/Plan  Acute metabolic encephalopathy   COVID   Elevated Troponin   Dementia Failure to thrive  Baseline not oriented to person, place or time   HTN   Level of care: Progressive  Status is: Inpatient  {Inpatient:23812}         Orene Desanctis DO [facility]   If 7PM-7AM, please contact night-coverage [email or web address]   10/04/2021, 8:29 PM

## 2021-10-04 NOTE — ED Triage Notes (Signed)
Pt to er via ems, per ems pt was oriented to self in November and was ambulatory with a walker, states that 6 days ago he went to [facility] and was dx with covid, states that at that time they were thinking about facility placement, states that pt is here today for decreased loc, pt in bed with eyes closed, pt has wet cough, pt minimally responsive to verbal stim, pt no talking at this time.

## 2021-10-05 ENCOUNTER — Inpatient Hospital Stay (HOSPITAL_COMMUNITY): Payer: Medicare HMO

## 2021-10-05 ENCOUNTER — Encounter (HOSPITAL_COMMUNITY): Payer: Self-pay | Admitting: Family Medicine

## 2021-10-05 DIAGNOSIS — U071 COVID-19: Secondary | ICD-10-CM

## 2021-10-05 DIAGNOSIS — R778 Other specified abnormalities of plasma proteins: Secondary | ICD-10-CM

## 2021-10-05 DIAGNOSIS — I10 Essential (primary) hypertension: Secondary | ICD-10-CM

## 2021-10-05 DIAGNOSIS — F0390 Unspecified dementia without behavioral disturbance: Secondary | ICD-10-CM

## 2021-10-05 DIAGNOSIS — I248 Other forms of acute ischemic heart disease: Secondary | ICD-10-CM | POA: Diagnosis not present

## 2021-10-05 DIAGNOSIS — L8990 Pressure ulcer of unspecified site, unspecified stage: Secondary | ICD-10-CM | POA: Insufficient documentation

## 2021-10-05 DIAGNOSIS — R400 Somnolence: Secondary | ICD-10-CM | POA: Diagnosis not present

## 2021-10-05 LAB — URINALYSIS, ROUTINE W REFLEX MICROSCOPIC
Bacteria, UA: NONE SEEN
Bilirubin Urine: NEGATIVE
Glucose, UA: NEGATIVE mg/dL
Ketones, ur: 5 mg/dL — AB
Leukocytes,Ua: NEGATIVE
Nitrite: NEGATIVE
Protein, ur: NEGATIVE mg/dL
Specific Gravity, Urine: 1.028 (ref 1.005–1.030)
pH: 5 (ref 5.0–8.0)

## 2021-10-05 LAB — MAGNESIUM: Magnesium: 2.5 mg/dL — ABNORMAL HIGH (ref 1.7–2.4)

## 2021-10-05 LAB — ECHOCARDIOGRAM COMPLETE
Area-P 1/2: 5.5 cm2
Height: 72 in
Weight: 2560 oz

## 2021-10-05 LAB — BASIC METABOLIC PANEL
Anion gap: 8 (ref 5–15)
Anion gap: 9 (ref 5–15)
BUN: 45 mg/dL — ABNORMAL HIGH (ref 8–23)
BUN: 36 mg/dL — ABNORMAL HIGH (ref 8–23)
CO2: 27 mmol/L (ref 22–32)
CO2: 26 mmol/L (ref 22–32)
Calcium: 8.5 mg/dL — ABNORMAL LOW (ref 8.9–10.3)
Calcium: 8.4 mg/dL — ABNORMAL LOW (ref 8.9–10.3)
Chloride: 115 mmol/L — ABNORMAL HIGH (ref 98–111)
Chloride: 116 mmol/L — ABNORMAL HIGH (ref 98–111)
Creatinine, Ser: 0.77 mg/dL (ref 0.61–1.24)
Creatinine, Ser: 0.76 mg/dL (ref 0.61–1.24)
GFR, Estimated: >60 mL/min (ref >60)
GFR, Estimated: >60 mL/min (ref >60)
Glucose, Bld: 111 mg/dL — ABNORMAL HIGH (ref 70–99)
Glucose, Bld: 118 mg/dL — ABNORMAL HIGH (ref 70–99)
Potassium: 3.2 mmol/L — ABNORMAL LOW (ref 3.5–5.1)
Potassium: 3.5 mmol/L (ref 3.5–5.1)
Sodium: 150 mmol/L — ABNORMAL HIGH (ref 135–145)
Sodium: 151 mmol/L — ABNORMAL HIGH (ref 135–145)

## 2021-10-05 LAB — HEPARIN LEVEL (UNFRACTIONATED): Heparin Unfractionated: 0.13 IU/mL — ABNORMAL LOW (ref 0.30–0.70)

## 2021-10-05 LAB — PROCALCITONIN: Procalcitonin: <0.1 ng/mL

## 2021-10-05 LAB — CBC
HCT: 46.1 % (ref 39.0–52.0)
Hemoglobin: 14.7 g/dL (ref 13.0–17.0)
MCH: 30.2 pg (ref 26.0–34.0)
MCHC: 31.9 g/dL (ref 30.0–36.0)
MCV: 94.7 fL (ref 80.0–100.0)
Platelets: 331 10*3/uL (ref 150–400)
RBC: 4.87 MIL/uL (ref 4.22–5.81)
RDW: 13.1 % (ref 11.5–15.5)
WBC: 12 10*3/uL — ABNORMAL HIGH (ref 4.0–10.5)
nRBC: 0 % (ref 0.0–0.2)

## 2021-10-05 LAB — C-REACTIVE PROTEIN: CRP: 11 mg/dL — ABNORMAL HIGH (ref <1.0)

## 2021-10-05 LAB — BLOOD GAS, ARTERIAL
Acid-Base Excess: 3.8 mmol/L — ABNORMAL HIGH (ref 0.0–2.0)
Bicarbonate: 26.9 mmol/L (ref 20.0–28.0)
Drawn by: 560031
FIO2: 21
O2 Saturation: 94.1 %
Patient temperature: 98.6
pCO2 arterial: 36.6 mmHg (ref 32.0–48.0)
pH, Arterial: 7.479 — ABNORMAL HIGH (ref 7.350–7.450)
pO2, Arterial: 70.5 mmHg — ABNORMAL LOW (ref 83.0–108.0)

## 2021-10-05 LAB — D-DIMER, QUANTITATIVE: D-Dimer, Quant: >20 ug/mL-FEU — ABNORMAL HIGH (ref 0.00–0.50)

## 2021-10-05 LAB — BRAIN NATRIURETIC PEPTIDE: B Natriuretic Peptide: 181.6 pg/mL — ABNORMAL HIGH (ref 0.0–100.0)

## 2021-10-05 MED ORDER — POTASSIUM CHLORIDE 10 MEQ/100ML IV SOLN
10.0000 meq | INTRAVENOUS | Status: AC
  Administered 2021-10-05 (×3): 10 meq via INTRAVENOUS
  Filled 2021-10-05 (×3): qty 100

## 2021-10-05 MED ORDER — LOSARTAN POTASSIUM 25 MG PO TABS: 25.0000 mg | ORAL_TABLET | Freq: Every day | ORAL | Status: DC

## 2021-10-05 MED ORDER — HEPARIN (PORCINE) 25000 UT/250ML-% IV SOLN
1500.0000 [IU]/h | INTRAVENOUS | Status: AC
  Administered 2021-10-05: 13:00:00 1100 [IU]/h via INTRAVENOUS
  Administered 2021-10-06 – 2021-10-08 (×4): 1350 [IU]/h via INTRAVENOUS
  Filled 2021-10-05 (×6): qty 250

## 2021-10-05 MED ORDER — HEPARIN BOLUS VIA INFUSION
2300.0000 [IU] | Freq: Once | INTRAVENOUS | Status: AC
  Administered 2021-10-05: 12:00:00 2300 [IU] via INTRAVENOUS
  Filled 2021-10-05: qty 2300

## 2021-10-05 MED ORDER — IOHEXOL 350 MG/ML SOLN
80.0000 mL | Freq: Once | INTRAVENOUS | Status: AC | PRN
  Administered 2021-10-05: 15:00:00 80 mL via INTRAVENOUS

## 2021-10-05 MED ORDER — HEPARIN BOLUS VIA INFUSION
2300.0000 [IU] | Freq: Once | INTRAVENOUS | Status: DC
  Filled 2021-10-05: qty 2300

## 2021-10-05 MED ORDER — SODIUM CHLORIDE (PF) 0.9 % IJ SOLN
INTRAMUSCULAR | Status: AC
  Filled 2021-10-05: qty 50

## 2021-10-05 MED ORDER — POTASSIUM CHLORIDE 2 MEQ/ML IV SOLN
INTRAVENOUS | Status: AC
  Filled 2021-10-05 (×4): qty 1000

## 2021-10-05 MED ORDER — PERFLUTREN LIPID MICROSPHERE
1.0000 mL | INTRAVENOUS | Status: AC | PRN
Start: 2021-10-05 — End: 2021-10-05
  Administered 2021-10-05: 11:00:00 2 mL via INTRAVENOUS
  Filled 2021-10-05: qty 10

## 2021-10-05 MED ORDER — NEBIVOLOL HCL 2.5 MG PO TABS
2.5000 mg | ORAL_TABLET | Freq: Every day | ORAL | Status: DC
  Filled 2021-10-05: qty 1

## 2021-10-05 MED ORDER — LEVOTHYROXINE SODIUM 100 MCG PO TABS
100.0000 ug | ORAL_TABLET | Freq: Every day | ORAL | Status: DC
  Administered 2021-10-07 – 2021-10-22 (×12): 100 ug via ORAL
  Filled 2021-10-05 (×14): qty 1

## 2021-10-05 MED ORDER — HEPARIN BOLUS VIA INFUSION
2000.0000 [IU] | Freq: Once | INTRAVENOUS | Status: AC
  Administered 2021-10-05: 23:00:00 2000 [IU] via INTRAVENOUS
  Filled 2021-10-05: qty 2000

## 2021-10-05 MED ORDER — ACETAMINOPHEN 650 MG RE SUPP
650.0000 mg | RECTAL | Status: AC | PRN
  Administered 2021-10-05 – 2021-10-06 (×3): 650 mg via RECTAL
  Filled 2021-10-05 (×3): qty 1

## 2021-10-05 MED ORDER — ASPIRIN 81 MG PO CHEW
81.0000 mg | CHEWABLE_TABLET | Freq: Every day | ORAL | Status: DC
  Administered 2021-10-08 – 2021-10-16 (×7): 81 mg via ORAL
  Filled 2021-10-05 (×10): qty 1

## 2021-10-05 MED ORDER — LEVALBUTEROL HCL 0.63 MG/3ML IN NEBU: 0.6300 mg | INHALATION_SOLUTION | Freq: Four times a day (QID) | RESPIRATORY_TRACT | Status: DC | PRN

## 2021-10-05 NOTE — Progress Notes (Signed)
Pharmacy Brief Note - Evening Anticoagulation Follow Up:  Patient is a 87yoM on heparin drip for suspected PE. CTA revealed bilateral PE. For full history, see note by Dia Sitter, PharmD from earlier today.   Assessment: HL = 0.13 on 1100 units/hr CBC WNL Confirmed with RN that heparin infusing at correct rate. No interruptions. No signs of bleeding.   Goal: HL 0.3 - 0.7  Plan: Heparin bolus 2000 units x 1 Increase heparin drip to 1350 Heparin level in 8 hours Daily CBC  Dolly Rias RPh 10/05/2021, 10:39 PM

## 2021-10-05 NOTE — Progress Notes (Signed)
D/W Dr. Irene Pap.  Patient with multiple medical issues, questionable PE, she will call us if cardiology consultation is needed.  If necessary, happy to arrange outpatient evaluation also from cardiac standpoint.   Adrian Prows, MD, [facility] 10/05/2021, 8:47 AM Office: [phone] Fax: [phone] Pager: [phone]

## 2021-10-05 NOTE — Progress Notes (Signed)
°   10/05/21 2100  Assess: MEWS Score  Temp (!) 102.8 F (39.3 C)  BP 128/87  Pulse Rate 88  ECG Heart Rate (!) 103  Resp 18  Level of Consciousness Responds to Voice  SpO2 96 %  O2 Device Room Air  Assess: MEWS Score  MEWS Temp 2  MEWS Systolic 0  MEWS Pulse 1  MEWS RR 0  MEWS LOC 1  MEWS Score 4  MEWS Score Color Red  Assess: if the MEWS score is Yellow or Red  Were vital signs taken at a resting state? Yes  Focused Assessment No change from prior assessment  Does the patient meet 2 or more of the SIRS criteria? Yes  Does the patient have a confirmed or suspected source of infection? Yes  Provider and Rapid Response Notified? Yes  MEWS guidelines implemented *See Row Information* Yes  Treat  MEWS Interventions Escalated (See documentation below)  Pain Scale FLACC  Pain Score 0  Faces Pain Scale 0  Take Vital Signs  Increase Vital Sign Frequency  Yellow: Q 2hr X 2 then Q 4hr X 2, if remains yellow, continue Q 4hrs;Red: Q 1hr X 4 then Q 4hr X 4, if remains red, continue Q 4hrs  Escalate  MEWS: Escalate Red: discuss with charge nurse/RN and provider, consider discussing with RRT  Notify: Charge Nurse/RN  Name of Charge Nurse/RN Notified Pam RN  Date Charge Nurse/RN Notified 10/05/21  Time Charge Nurse/RN Notified 2221  Notify: Provider  Provider Name/Title Olena Heckle  Date Provider Notified 10/05/21  Time Provider Notified 2200  Notification Type Page  Notification Reason Critical result  Provider response At bedside  Date of Provider Response 10/05/21  Time of Provider Response 2230  Notify: Rapid Response  Name of Rapid Response RN Notified Mandy RN  Date Rapid Response Notified 10/05/21  Time Rapid Response Notified 2225  Assess: SIRS CRITERIA  SIRS Temperature  1  SIRS Pulse 1  SIRS Respirations  0  SIRS WBC 0  SIRS Score Sum  2

## 2021-10-05 NOTE — Progress Notes (Signed)
Chart reviewed. Trop down trending. Na still up. Again, picture not consistent with ACS. Continue current management as per primary team.   Nigel Mormon, MD Pager: [phone] Office: [phone]

## 2021-10-05 NOTE — Progress Notes (Signed)
Initial Nutrition Assessment  DOCUMENTATION CODES:  Not applicable  INTERVENTION:  Consider SLP evaluation given pt with history of dementia for swallowing.  Advance diet if possible if within [place].  RD to order supplements based on SLP evaluation.  NUTRITION DIAGNOSIS:  Inadequate oral intake related to lethargy/confusion as evidenced by per patient/family report.  GOAL:  Patient will meet greater than or equal to 90% of their needs  MONITOR:  Diet advancement, PO intake, Supplement acceptance, Labs, Weight trends, I & O's  REASON FOR ASSESSMENT:  Malnutrition Screening Tool    ASSESSMENT:  87 yo male with a PMH of dementia, hypertension, hypothyroidism, history of GI bleed who presents with concerns of increasing altered mental status. He tested positive for COVID about 6 days ago and since then has had decreased appetite. Admitted with AMS.  RD working remotely.  Pt currently NPO. Prognosis is poor, family with ongoing discussions regarding [place].  Pt with limited weight history. Per Epic, pt has lost ~9 lbs (5.2%) in 11 months, which is not necessarily significant for the time frame.  Of note, pt with severe BLE edema.  Messaged MD regarding plan for diet advancement. Recommended a cognitive evaluation for safe swallowing given patient's dementia.  Suspect patient is malnourished to some degree but cannot definitively diagnose at this time.  Medications: reviewed; Synthroid, D5 with K-Cl 40 mEq @ 50 ml/hr  Labs: reviewed; Na 150 (H), K 3.2 (L), Glucose 111 (H), Mag 2.5 (H)  NUTRITION - FOCUSED PHYSICAL EXAM: Unable to perform - defer to follow-up  Diet Order:   Diet Order             Diet NPO time specified  Diet effective now                  EDUCATION NEEDS:  Not appropriate for education at this time  Skin:  Skin Assessment: Skin Integrity Issues: Skin Integrity Issues:: Stage II Stage II: Bilateral buttocks and sacral  Last BM:  PTA  Height:   Ht Readings from Last 1 Encounters:  10/04/21 6' (1.829 m)   Weight:  Wt Readings from Last 1 Encounters:  10/04/21 72.6 kg   BMI:  Body mass index is 21.7 kg/m.  Estimated Nutritional Needs:  Kcal:  1900-2100 Protein:  95-110 grams Fluid:  >1.9 L  Derrel Nip, RD, LDN (she/her/hers) Clinical Inpatient Dietitian RD Pager/After-Hours/Weekend Pager # in [place]

## 2021-10-05 NOTE — Plan of Care (Signed)
  Problem: Education: Goal: Knowledge of risk factors and measures for prevention of condition will improve Outcome: Progressing   

## 2021-10-05 NOTE — Plan of Care (Signed)
°  Problem: Respiratory: Goal: Will maintain a patent airway Outcome: Progressing   Problem: Clinical Measurements: Goal: Diagnostic test results will improve Outcome: Progressing Goal: Respiratory complications will improve Outcome: Progressing Goal: Cardiovascular complication will be avoided Outcome: Progressing

## 2021-10-05 NOTE — Progress Notes (Signed)
Attempted lower extremity venous duplex x2, pt in CT and then with lab. Per RN, patient unlikely to tolerate exam given current mentation and pressure of transducer that is needed to be applied to legs. Will attempt again tomorrow as schedule permits.  10/05/2021 3:45 PM Kelby Aline., MHA, RVT, RDCS, RDMS

## 2021-10-05 NOTE — H&P (Signed)
H&P      History and Physical    ELL TISO UVO:[id number] DOB: 10/23/1933 DOA: 10/04/2021  PCP: Jilda Panda, MD  Patient coming from: Home  I have personally briefly reviewed patient's old medical records in [place]  Chief Complaint: Weakness, altered mental status.   HPI: Ian Weber is a 87 y.o. male with medical history significant for dementia, hypertension, hypothyroidism, history of GI bleed who presents with concerns of increasing altered mental status.   Wife at bedside provides history as patient only responsive to noxious stimuli. He tested positive for COVID about 6 days ago and since then has had decreased appetite.  Usually at baseline he is able to help wife with transfer but for the last couple days has become increasingly more bedbound.  Patient has been grunting, not able to carry conversation or opening his eyes. Has not complaint of pain but wife noticed increase labored respiration.   ED Course: He was afebrile, normotensive on room air. CBC unremarkable.  Lactate of 2. Sodium of 148, K of 3.9, creatinine of 1.01, BG of 116.  Troponin of 4339 with EKG showing RBBB and left anterior fascicular block.  Cardiology was consulted by ED PA who feels this more likely demand ischemia and does not recommend starting IV heparin unless he has a significant delta change on the next troponin.  Review of Systems: Patient unable to provide for ROS since he was obtunded  Social Pt lives with his wife who does all her his ADLS. He is only able to help her with transfer from bed to chair. Usually alert but not oriented at baseline with his demential. No tobacco, alcohol or illicit drug use.   Past Medical History:  Diagnosis Date   Anxiety    Chronic venous stasis    Depression    Diverticulosis    Diverticulosis of colon (without mention of hemorrhage)    Esophageal reflux    GERD (gastroesophageal reflux disease)    Hiatal hernia    History of BPH     Hypothyroidism    Irritable bowel syndrome    Lactose intolerance    Stricture and stenosis of esophagus    Thyroid cancer ([place])    Vitamin D insufficiency     Past Surgical History:  Procedure Laterality Date   CARDIOVERSION  08/08/2012   Procedure: CARDIOVERSION;  Surgeon: Laverda Page, MD;  Location: [facility];  Service: Cardiovascular;  Laterality: N/A;   CATARACT EXTRACTION     COLONOSCOPY     COLONOSCOPY WITH PROPOFOL N/A 09/25/2017   Procedure: COLONOSCOPY WITH PROPOFOL;  Surgeon: Gatha Mayer, MD;  Location: [facility];  Service: Endoscopy;  Laterality: N/A;  moderate sedation acceptable also   HERNIA REPAIR  1992   THYROIDECTOMY  1990   Dr Deon Pilling     reports that he has never smoked. He has never used smokeless tobacco. He reports that he does not drink alcohol and does not use drugs. Social History  Allergies  Allergen Reactions   Amlodipine Other (See Comments)    Reaction not recalled by patient   Chlorthalidone Other (See Comments)    Reaction not recalled by patient   Codeine Phosphate Other (See Comments)    Weakness, sweating   Hydrochlorothiazide W-Triamterene Other (See Comments)    Reaction not recalled by patient   Prednisone Itching   Sulfamethoxazole Itching   Triamterene Other (See Comments)    Reaction not recalled by patient    Family History  Family history unknown: Yes     Prior to Admission medications   Medication Sig Start Date End Date Taking? Authorizing Provider  aspirin 81 MG tablet Take 1 tablet (81 mg total) by mouth daily. 09/27/17  Yes Hosie Poisson, MD  furosemide (LASIX) 40 MG tablet Take 40 mg by mouth daily. 04/30/20  Yes [provider]  levothyroxine (SYNTHROID) 100 MCG tablet Take 100 mcg by mouth daily. 09/25/21  Yes [provider]  losartan (COZAAR) 25 MG tablet Take 25 mg by mouth daily. 05/04/20  Yes [provider]  nebivolol (BYSTOLIC) 2.5 MG tablet Take 2.5 mg by mouth daily.   Yes  [provider]  polyethylene glycol powder (GLYCOLAX/MIRALAX) powder TAKE 17 G BY MOUTH 2 (TWO) TIMES DAILY. Patient not taking: Reported on 09/28/2021 09/28/17   Doran Stabler, MD  senna-docusate (SENOKOT-S) 8.6-50 MG tablet Take 1 tablet by mouth daily as needed for moderate constipation. Patient not taking: Reported on 09/28/2021 02/26/21   Isla Pence, MD    Physical Exam: Vitals:   10/04/21 1537 10/04/21 1538 10/04/21 1625 10/04/21 1820  BP: (!) 110/96  107/77 105/83  Pulse: 86  85 84  Resp: 18  18 12   Temp: 98 F (36.7 C)  98.8 F (37.1 C)   TempSrc: Axillary  Rectal   SpO2: 94%  100% 93%  Weight:  72.6 kg    Height:  6' (1.829 m)      Constitutional: Ill-appearing thin frail patient obtunded and responsive only to noxious stimuli.  Patient unable to follow commands and only grunts on exam. Vitals:   10/04/21 1537 10/04/21 1538 10/04/21 1625 10/04/21 1820  BP: (!) 110/96  107/77 105/83  Pulse: 86  85 84  Resp: 18  18 12   Temp: 98 F (36.7 C)  98.8 F (37.1 C)   TempSrc: Axillary  Rectal   SpO2: 94%  100% 93%  Weight:  72.6 kg    Height:  6' (1.829 m)     Eyes: Patient intentionally held eye closed when attempted to open his lids ENMT: Mucous membranes are moist.  Neck: normal, supple Respiratory: clear to auscultation bilaterally, no wheezing, no crackles. Normal respiratory effort on room air. No accessory muscle use.  Cardiovascular: Regular rate and rhythm, no murmurs / rubs / gallops. Circumferential non-pitting lower extremity edema up to mid tibia region of bilateral lower extremity Abdomen: no tenderness,. Bowel sounds positive.  Musculoskeletal: no clubbing / cyanosis. No joint deformity upper and lower extremities.  Skin: no rashes, lesions, ulcers. No induration Neurologic: obtunded responding only to noxious stimuli, grunting. Became more alert when blood was drawn and briefly opened eyes.  Psychiatric: obtunded   Labs on Admission: I  have personally reviewed following labs and imaging studies  CBC: Recent Labs  Lab 09/28/21 2030 10/04/21 1548  WBC 10.4 10.3  NEUTROABS 8.2* 8.6*  HGB 13.2 16.2  HCT 40.2 51.5  MCV 92.4 94.7  PLT 174 419   Basic Metabolic Panel: Recent Labs  Lab 09/28/21 2030 10/04/21 1548  NA 140 148*  K 3.4* 3.9  CL 104 108  CO2 27 27  GLUCOSE 100* 116*  BUN 20 55*  CREATININE 0.80 1.01  CALCIUM 8.3* 9.4   GFR: Estimated Creatinine Clearance: 54.9 mL/min (by C-G formula based on SCr of 1.01 mg/dL). Liver Function Tests: Recent Labs  Lab 09/28/21 2030 10/04/21 1548  AST 32 33  ALT 17 20  ALKPHOS 73 87  BILITOT 1.0 1.0  PROT  6.9 9.0*  ALBUMIN 3.2* 4.0   No results for input(s): LIPASE, AMYLASE in the last 168 hours. No results for input(s): AMMONIA in the last 168 hours. Coagulation Profile: No results for input(s): INR, PROTIME in the last 168 hours. Cardiac Enzymes: No results for input(s): CKTOTAL, CKMB, CKMBINDEX, TROPONINI in the last 168 hours. BNP (last 3 results) No results for input(s): PROBNP in the last 8760 hours. HbA1C: No results for input(s): HGBA1C in the last 72 hours. CBG: No results for input(s): GLUCAP in the last 168 hours. Lipid Profile: No results for input(s): CHOL, HDL, LDLCALC, TRIG, CHOLHDL, LDLDIRECT in the last 72 hours. Thyroid Function Tests: Recent Labs    10/04/21 1549  TSH 1.234   Anemia Panel: No results for input(s): VITAMINB12, FOLATE, FERRITIN, TIBC, IRON, RETICCTPCT in the last 72 hours. Urine analysis:    Component Value Date/Time   COLORURINE YELLOW 09/28/2021 2030   APPEARANCEUR HAZY (A) 09/28/2021 2030   LABSPEC 1.019 09/28/2021 2030   PHURINE 5.0 09/28/2021 2030   GLUCOSEU NEGATIVE 09/28/2021 2030   HGBUR MODERATE (A) 09/28/2021 2030   [place] NEGATIVE 09/28/2021 2030   [place] 09/28/2021 2030   PROTEINUR 30 (A) 09/28/2021 2030   UROBILINOGEN 0.2 11/19/2012 1138   NITRITE NEGATIVE 09/28/2021 2030    LEUKOCYTESUR NEGATIVE 09/28/2021 2030    Radiological Exams on Admission: CT Head Wo Contrast  Result Date: 10/04/2021 CLINICAL DATA:  87 year old male with a history of mental status changes EXAM: CT HEAD WITHOUT CONTRAST TECHNIQUE: Contiguous axial images were obtained from the base of the skull through the vertex without intravenous contrast. COMPARISON:  None. FINDINGS: Brain: No acute intracranial hemorrhage. No midline shift or mass effect. Gray-white differentiation maintained. Confluent hypodensity in the bilateral periventricular white matter predominantly posteriorly. Unremarkable appearance of the ventricular system. Vascular: Calcifications of the intracranial vasculature. Skull: No acute fracture.  No aggressive bone lesion identified. Sinuses/Orbits: Unremarkable appearance of the orbits. Mastoid air cells clear. No middle ear effusion. No significant sinus disease. Other: None IMPRESSION: Negative for acute intracranial abnormality. Changes of chronic microvascular ischemic disease and associated intracranial atherosclerosis Electronically Signed   By: Corrie Mckusick D.O.   On: 10/04/2021 17:12   DG Chest Port 1 View  Result Date: 10/04/2021 CLINICAL DATA:  Altered mental status COVID EXAM: PORTABLE CHEST 1 VIEW COMPARISON:  09/28/2021 FINDINGS: Postsurgical changes at the thoracic inlet. No focal opacity, pleural effusion or pneumothorax. Normal cardiomediastinal silhouette. IMPRESSION: No active disease. Electronically Signed   By: Donavan Foil M.D.   On: 10/04/2021 17:21      Assessment/Plan  Acute metabolic encephalopathy  Suspect secondary to COVID and decrease intake with underlying demand ischemia from likely severe undiagnosed CAD. Also suspect just general decline in health.  -Will hydrate with IV fluids overnight but had extensive discussion with wife that his prognosis remains poor and guarded during this admission  COVID 19 viral illness Positive on 09/28/2021   Start IV remdesivir  Elevated Troponin  Trending. 4339-->2885 Likely demand with underlying undiagnosed CAD Cardiology is aware and will see in consultation tomorrow Obtain echocardiogram  Dementia Failure to thrive  Baseline not oriented to person, place or time   HTN Controlled  Hold Lasix while receiving fluid   Sacral decubitus ulcer Wound care daily  Pressure Injury 10/04/21 Sacrum Stage 2 -  Partial thickness loss of dermis presenting as a shallow open injury with a red, pink wound bed without slough. (Active)  10/04/21 2300  Location: Sacrum  Location Orientation:  Staging: Stage 2 -  Partial thickness loss of dermis presenting as a shallow open injury with a red, pink wound bed without slough.  Wound Description (Comments):   Present on Admission: Yes     Pressure Injury 10/04/21 Buttocks Bilateral Stage 2 -  Partial thickness loss of dermis presenting as a shallow open injury with a red, pink wound bed without slough. (Active)  10/04/21 2300  Location: Buttocks  Location Orientation: Bilateral  Staging: Stage 2 -  Partial thickness loss of dermis presenting as a shallow open injury with a red, pink wound bed without slough.  Wound Description (Comments):   Present on Admission: Yes        DVT prophylaxis:.Lovenox Code Status: Full Family Communication: Plan discussed with wife at bedside  disposition Plan: Home with at least 2 midnight stays  Consults called: cardiology Admission status: inpatient  Level of care: Progressive  Status is: Inpatient  Remains inpatient appropriate because: Admit - It is my clinical opinion that admission to INPATIENT is reasonable and necessary because this patient will require at least 2 midnights in the hospital to treat this condition based on the medical complexity of the problems presented.  Given the aforementioned information, the predictability of an adverse outcome is felt to be significant.         Orene Desanctis DO [facility]   If 7PM-7AM, please contact night-coverage [email or web address]   10/04/2021, 8:29 PM

## 2021-10-05 NOTE — Progress Notes (Incomplete)
Pharmacy Brief Note - Evening Anticoagulation Follow Up:  Patient is a 87yoM on heparin drip for suspected PE. CTA revealed bilateral PE. For full history, see note by Dia Sitter, PharmD from earlier today.   Assessment: HL =  Confirmed with RN that heparin infusing at correct rate. No interruptions. No signs of bleeding.   Goal: HL 0.3 - 0.7  Plan: Heparin   Lenis Noon, PharmD 10/05/21 7:52 PM

## 2021-10-05 NOTE — Progress Notes (Signed)
[place] for heparin Indication: r/o pulmonary embolus  Allergies  Allergen Reactions   Amlodipine Other (See Comments)    Reaction not recalled by patient   Chlorthalidone Other (See Comments)    Reaction not recalled by patient   Codeine Phosphate Other (See Comments)    Weakness, sweating   Hydrochlorothiazide W-Triamterene Other (See Comments)    Reaction not recalled by patient   Prednisone Itching   Sulfamethoxazole Itching   Triamterene Other (See Comments)    Reaction not recalled by patient    Patient Measurements: Height: 6' (182.9 cm) Weight: 72.6 kg (160 lb) IBW/kg (Calculated) : 77.6 Heparin Dosing Weight: 73 kg  Vital Signs: Temp: 98.8 F (37.1 C) (12/28 0950) Temp Source: Oral (12/28 0950) BP: 109/83 (12/28 0950) Pulse Rate: 91 (12/28 0950)  Labs: Recent Labs    10/04/21 1548 10/04/21 1852 10/05/21 0359  HGB 16.2  --   --   HCT 51.5  --   --   PLT 359  --   --   CREATININE 1.01  --  0.77  TROPONINIHS 4,339* 2,885*  --     Estimated Creatinine Clearance: 69.3 mL/min (by C-G formula based on SCr of 0.77 mg/dL).   Assessment: Patient is an 87 y.o M who was recently diagnosed with COVID-19 presented to the ED on 10/04/21 with AMS. Troponin was found to be elevated, but cardiology does not think clinical picture is consistent with ACS.  D-dimer on 10/05/21 was found to be >20. Pharmacy has been consulted to dose heparin drip for suspected PE.  Today, 10/05/2021: - Ddimer >20 - cbc on 12/27 ok - pt received lovenox 40 mg dose at 0625 this morning  Goal of Therapy:  Heparin level 0.3-0.7 units/ml Monitor platelets by anticoagulation protocol: Yes   Plan:  - d/c lovenox 40mg  daily - heparin 2300 units IV x1 bolus, then drip at 1100 units/hr - check 8 hr heparin level - monitor for s/sx bleeding - f/u chest CTA result  Charline Hoskinson P 10/05/2021,12:44 PM

## 2021-10-05 NOTE — Progress Notes (Signed)
PROGRESS NOTE  Ian Weber IOE:[id number] DOB: [birth year] DOA: 10/04/2021 PCP: Jilda Panda, MD  HPI/Recap of past 24 hours: Ian Weber is a 87 y.o. male with medical history significant for dementia, hypertension, hypothyroidism, history of GI bleed who presents from home with concerns of increasing altered mental status and generalized weakness.  Associated with poor appetite and poor oral intake.  Was brought to the ED for further evaluation.  Found to be COVID-19 positive with electrolytes abnormality.  Started on antiviral IV remdesivir.  10/05/2021: Patient was seen and examined at his bedside.  His wife was present in the room.  Per his wife he is not back to his baseline mentation.  He is weak appearing.  D-dimer elevated this morning greater than 20.  Patient started on heparin drip, CTA PE and bilateral Doppler ultrasound ordered to rule out venous thromboembolism.  Assessment/Plan: Principal Problem:   AMS (altered mental status) Active Problems:   Pressure injury of skin   COVID-19 virus infection   Elevated troponin   Dementia without behavioral disturbance (HCC)   HTN (hypertension)   Acute metabolic encephalopathy, multifactorial secondary to COVID-19 viral infection and hypernatremia from poor oral intake, dehydration.  CT head nonacute Reorient as needed Treat underlying conditions  Elevated troponin in the setting of covid 19 infection Trop S peaked at >4300 and down trended Follow 2D echo Follow CTA to r/o PE  Covid-19 viral infection, POA D-dimer>20 Trend inflammatory markers  Elevated D-dimer, r/o thromboembolism D-dimer>20 10/05/21 Start Hep drip until PE or DVT are ruled out Follow results  Hypernatremia, hypovolemic Started D5KCL 40 meq at 50cc/hr x 2 days Repeat BMP this afternoon  Hypokalemia K+ 3.2 Replete with IV fluid  Dementia Re-orient as needed Fall/aspiration/delirium precautions  Code Status: Full code   Family  Communication: Updated his wife at bedside   Disposition Plan: Likely to return to home when hemodynamically stable and close to baseline.  Ongoing work up to r/o PE/DVT or WMA with elevated troponins.    Consultants: None   Procedures: 2D echo   Antimicrobials: Antiviral Remdesivir   DVT prophylaxis:  Hep drip  Status is: Inpatient  Patient requires at least 2 midnights for further evaluation and treatment of present conditions.        Objective: Vitals:   10/04/21 2253 10/05/21 0145 10/05/21 0521 10/05/21 0950  BP: 127/75 113/74 109/83 109/83  Pulse: 91 83 82 91  Resp: 16 14 18 20   Temp: 98.7 F (37.1 C) 97.7 F (36.5 C) 98 F (36.7 C) 98.8 F (37.1 C)  TempSrc: Oral Oral Oral Oral  SpO2: 99% 97% 98% 95%  Weight:      Height:        Intake/Output Summary (Last 24 hours) at 10/05/2021 1120 Last data filed at 10/05/2021 0805 Gross per 24 hour  Intake 1931.31 ml  Output 600 ml  Net 1331.31 ml   Filed Weights   10/04/21 1538  Weight: 72.6 kg    Exam:  General: 87 y.o. year-old male well developed well nourished in no acute distress.  Somnolent. Cardiovascular: Regular rate and rhythm with no rubs or gallops.  No thyromegaly or JVD noted.   Respiratory: Mild diffused rales.  No wheezing noted.  Poor inspiratory efforts.  Abdomen: Soft nontender nondistended with normal bowel sounds x4 quadrants. Musculoskeletal: No lower extremity edema. 2/4 pulses in all 4 extremities. Skin: No ulcerative lesions noted or rashes, Psychiatry: Mood is appropriate for condition and setting   Data Reviewed:  CBC: Recent Labs  Lab 09/28/21 2030 10/04/21 1548  WBC 10.4 10.3  NEUTROABS 8.2* 8.6*  HGB 13.2 16.2  HCT 40.2 51.5  MCV 92.4 94.7  PLT 174 433   Basic Metabolic Panel: Recent Labs  Lab 09/28/21 2030 10/04/21 1548 10/05/21 0359 10/05/21 0836  NA 140 148* 150*  --   K 3.4* 3.9 3.2*  --   CL 104 108 115*  --   CO2 27 27 27   --   GLUCOSE 100* 116*  111*  --   BUN 20 55* 45*  --   CREATININE 0.80 1.01 0.77  --   CALCIUM 8.3* 9.4 8.5*  --   MG  --   --   --  2.5*   GFR: Estimated Creatinine Clearance: 69.3 mL/min (by C-G formula based on SCr of 0.77 mg/dL). Liver Function Tests: Recent Labs  Lab 09/28/21 2030 10/04/21 1548  AST 32 33  ALT 17 20  ALKPHOS 73 87  BILITOT 1.0 1.0  PROT 6.9 9.0*  ALBUMIN 3.2* 4.0   No results for input(s): LIPASE, AMYLASE in the last 168 hours. No results for input(s): AMMONIA in the last 168 hours. Coagulation Profile: No results for input(s): INR, PROTIME in the last 168 hours. Cardiac Enzymes: No results for input(s): CKTOTAL, CKMB, CKMBINDEX, TROPONINI in the last 168 hours. BNP (last 3 results) No results for input(s): PROBNP in the last 8760 hours. HbA1C: No results for input(s): HGBA1C in the last 72 hours. CBG: No results for input(s): GLUCAP in the last 168 hours. Lipid Profile: No results for input(s): CHOL, HDL, LDLCALC, TRIG, CHOLHDL, LDLDIRECT in the last 72 hours. Thyroid Function Tests: Recent Labs    10/04/21 1549  TSH 1.234   Anemia Panel: No results for input(s): VITAMINB12, FOLATE, FERRITIN, TIBC, IRON, RETICCTPCT in the last 72 hours. Urine analysis:    Component Value Date/Time   COLORURINE YELLOW 10/05/2021 0631   APPEARANCEUR HAZY (A) 10/05/2021 0631   LABSPEC 1.028 10/05/2021 0631   PHURINE 5.0 10/05/2021 0631   GLUCOSEU NEGATIVE 10/05/2021 0631   HGBUR SMALL (A) 10/05/2021 0631   BILIRUBINUR NEGATIVE 10/05/2021 0631   KETONESUR 5 (A) 10/05/2021 0631   PROTEINUR NEGATIVE 10/05/2021 0631   UROBILINOGEN 0.2 11/19/2012 1138   NITRITE NEGATIVE 10/05/2021 0631   LEUKOCYTESUR NEGATIVE 10/05/2021 0631   Sepsis Labs: @LABRCNTIP (procalcitonin:4,lacticidven:4)  ) Recent Results (from the past 240 hour(s))  Culture, blood (routine x 2)     Status: None   Collection Time: 09/28/21  7:43 PM   Specimen: BLOOD RIGHT ARM  Result Value Ref Range Status    Specimen Description BLOOD RIGHT ARM  Final   Special Requests   Final    BOTTLES DRAWN AEROBIC AND ANAEROBIC Blood Culture results may not be optimal due to an inadequate volume of blood received in culture bottles   Culture   Final    NO GROWTH 5 DAYS Performed at [facility], [place] [address], [place],  [address]    Report Status 10/03/2021 FINAL  Final  Resp Panel by RT-PCR (Flu A&B, Covid) Peripheral     Status: Abnormal   Collection Time: 09/28/21  8:31 PM   Specimen: Peripheral; Nasopharyngeal(NP) swabs in vial transport medium  Result Value Ref Range Status   SARS Coronavirus 2 by RT PCR POSITIVE (A) NEGATIVE Final    Comment: (NOTE) SARS-CoV-2 target nucleic acids are DETECTED.  The SARS-CoV-2 RNA is generally detectable in upper respiratory specimens during the acute phase of  infection. Positive results are indicative of the presence of the identified virus, but do not rule out bacterial infection or co-infection with other pathogens not detected by the test. Clinical correlation with patient history and other diagnostic information is necessary to determine patient infection status. The expected result is Negative.  Fact Sheet for Patients: EntrepreneurPulse.com.au  Fact Sheet for Healthcare Providers: IncredibleEmployment.be  This test is not yet approved or cleared by the [place] FDA and  has been authorized for detection and/or diagnosis of SARS-CoV-2 by FDA under an Emergency Use Authorization (EUA).  This EUA will remain in effect (meaning this test can be used) for the duration of  the COVID-19 declaration under Section 564(b)(1) of the A ct, 21 U.S.C. section 360bbb-3(b)(1), unless the authorization is terminated or revoked sooner.     Influenza A by PCR NEGATIVE NEGATIVE Final   Influenza B by PCR NEGATIVE NEGATIVE Final    Comment: (NOTE) The Xpert Xpress SARS-CoV-2/FLU/RSV plus assay is intended as an  aid in the diagnosis of influenza from Nasopharyngeal swab specimens and should not be used as a sole basis for treatment. Nasal washings and aspirates are unacceptable for Xpert Xpress SARS-CoV-2/FLU/RSV testing.  Fact Sheet for Patients: EntrepreneurPulse.com.au  Fact Sheet for Healthcare Providers: IncredibleEmployment.be  This test is not yet approved or cleared by the [place] FDA and has been authorized for detection and/or diagnosis of SARS-CoV-2 by FDA under an Emergency Use Authorization (EUA). This EUA will remain in effect (meaning this test can be used) for the duration of the COVID-19 declaration under Section 564(b)(1) of the Act, 21 U.S.C. section 360bbb-3(b)(1), unless the authorization is terminated or revoked.  Performed at [facility], [place] [address], [place], [place] [address]   Culture, blood (routine x 2)     Status: None   Collection Time: 09/28/21  8:35 PM   Specimen: BLOOD RIGHT HAND  Result Value Ref Range Status   Specimen Description BLOOD RIGHT HAND  Final   Special Requests   Final    BOTTLES DRAWN AEROBIC AND ANAEROBIC Blood Culture results may not be optimal due to an inadequate volume of blood received in culture bottles   Culture   Final    NO GROWTH 5 DAYS Performed at [facility], [place] [address], [place], [place] [address]    Report Status 10/03/2021 FINAL  Final      Studies: CT Head Wo Contrast  Result Date: 10/04/2021 CLINICAL DATA:  86 year old male with a history of mental status changes EXAM: CT HEAD WITHOUT CONTRAST TECHNIQUE: Contiguous axial images were obtained from the base of the skull through the vertex without intravenous contrast. COMPARISON:  None. FINDINGS: Brain: No acute intracranial hemorrhage. No midline shift or mass effect. Gray-white differentiation maintained. Confluent hypodensity in the bilateral periventricular white matter predominantly posteriorly.  Unremarkable appearance of the ventricular system. Vascular: Calcifications of the intracranial vasculature. Skull: No acute fracture.  No aggressive bone lesion identified. Sinuses/Orbits: Unremarkable appearance of the orbits. Mastoid air cells clear. No middle ear effusion. No significant sinus disease. Other: None IMPRESSION: Negative for acute intracranial abnormality. Changes of chronic microvascular ischemic disease and associated intracranial atherosclerosis Electronically Signed   By: Corrie Mckusick D.O.   On: 10/04/2021 17:12   DG Chest Port 1 View  Result Date: 10/04/2021 CLINICAL DATA:  Altered mental status COVID EXAM: PORTABLE CHEST 1 VIEW COMPARISON:  09/28/2021 FINDINGS: Postsurgical changes at the thoracic inlet. No focal opacity, pleural effusion or pneumothorax. Normal  cardiomediastinal silhouette. IMPRESSION: No active disease. Electronically Signed   By: Donavan Foil M.D.   On: 10/04/2021 17:21    Scheduled Meds:  aspirin  81 mg Oral Daily   enoxaparin (LOVENOX) injection  40 mg Subcutaneous Q24H   levothyroxine  100 mcg Oral Q0600    Continuous Infusions:  dextrose 5 % with kcl 50 mL/hr at 10/05/21 1006   remdesivir 100 mg in NS 100 mL       LOS: 1 day     Kayleen Memos, MD [facility] Pager [phone]  If 7PM-7AM, please contact night-coverage [email or web address] Password [facility] 10/05/2021, 11:20 AM

## 2021-10-05 NOTE — Evaluation (Signed)
Clinical/Bedside Swallow Evaluation Patient Details  Name: Ian Weber MRN: [id number] Date of Birth: 02/17/1934  Today's Date: 10/05/2021 Time: SLP Start Time (ACUTE ONLY): 18 SLP Stop Time (ACUTE ONLY): 5631 SLP Time Calculation (min) (ACUTE ONLY): 28 min  Past Medical History:  Past Medical History:  Diagnosis Date   Anxiety    Chronic venous stasis    Depression    Diverticulosis    Diverticulosis of colon (without mention of hemorrhage)    Esophageal reflux    GERD (gastroesophageal reflux disease)    Hiatal hernia    History of BPH    Hypothyroidism    Irritable bowel syndrome    Lactose intolerance    Stricture and stenosis of esophagus    Thyroid cancer ([place])    Vitamin D insufficiency    Past Surgical History:  Past Surgical History:  Procedure Laterality Date   CARDIOVERSION  08/08/2012   Procedure: CARDIOVERSION;  Surgeon: Laverda Page, MD;  Location: [place];  Service: Cardiovascular;  Laterality: N/A;   CATARACT EXTRACTION     COLONOSCOPY     COLONOSCOPY WITH PROPOFOL N/A 09/25/2017   Procedure: COLONOSCOPY WITH PROPOFOL;  Surgeon: Gatha Mayer, MD;  Location: [facility];  Service: Endoscopy;  Laterality: N/A;  moderate sedation acceptable also   HERNIA REPAIR  1992   THYROIDECTOMY  1990   Dr Deon Pilling   HPI:  Ian Weber is a 87 yo male with a PMH of dementia, hypertension, hypothyroidism, history of GI bleed who presents from home with concerns for increasing altered mental status, poor appetite and poor PO intake. He tested positive for COVID about six days PTA. Dx acute metabolic encephalopathy secondary to COVID-19, hypernatremia, dehydration.    Assessment / Plan / Recommendation  Clinical Impression  Pt alert, not following commands, talking intermittently but difficult to understand. Voice at baseline is wet; cough is very congested at level of larynx. Wife at bedside. Oral mechanism exam revealed dried/thick secretions covering teeth and  palate.  Rigorous oral care provided, removing most of visible secretions; oral suctioning removed thick/yellow secretions from oropharynx. Pt demonstrated recognition of ice chips; he actively masticated them and triggered a consistent swallow response.  Coughing was productive and pt was able to expectorate more secretions. Suspect that there are copious dried secretions in pharynx that are difficult to reach, and since pt has not eaten at all, they have accummulated.   He may benefit from nasotracheal suctioning - or even suctioning to level of hypopharynx to help with removal as long as it is not traumatic for him.  Given his ability to chew and activate a swallow, recommend allowing ice chips after oral care to help prevent further drying of mucosa and to maintain spontaneous swallowing. D/W RN.  Please continue aggressive oral care. Continue NPO except ice chips. SLP will follow. SLP Visit Diagnosis: Dysphagia, unspecified (R13.10)           Diet Recommendation   NPO except ice chips intermittently throughout the day       Other  Recommendations Oral Care Recommendations: Oral care QID;Oral care prior to ice chip/H20    Recommendations for follow up therapy are one component of a multi-disciplinary discharge planning process, led by the attending physician.  Recommendations may be updated based on patient status, additional functional criteria and insurance authorization.  Follow up Recommendations Other (comment) (tba)      Assistance Recommended at Discharge Frequent or constant Supervision/Assistance  Functional Status Assessment  Frequency and Duration min 2x/week  1 week       Prognosis Prognosis for Safe Diet Advancement: Guarded Barriers to Reach Goals: Severity of deficits      Swallow Study   General HPI: Ian Weber is a 87 yo male with a PMH of dementia, hypertension, hypothyroidism, history of GI bleed who presents from home with concerns for increasing altered  mental status, poor appetite and poor PO intake. He tested positive for COVID about six days PTA. Dx acute metabolic encephalopathy secondary to COVID-19, hypernatremia, dehydration. Type of Study: Bedside Swallow Evaluation Previous Swallow Assessment: no Diet Prior to this Study: NPO Temperature Spikes Noted: No Respiratory Status: Room air History of Recent Intubation: No Behavior/Cognition: Lethargic/Drowsy Oral Cavity Assessment: Dried secretions;Excessive secretions Oral Care Completed by SLP: Yes Self-Feeding Abilities: Total assist Patient Positioning: Upright in bed Baseline Vocal Quality: Wet Volitional Cough: Cognitively unable to elicit Volitional Swallow: Unable to elicit    Oral/Motor/Sensory Function Overall Oral Motor/Sensory Function: Other (comment) (symmetric at baseline)   Ice Chips Ice chips: Impaired Presentation: Spoon Pharyngeal Phase Impairments: Cough - Delayed   Thin Liquid Thin Liquid: Not tested    Nectar Thick Nectar Thick Liquid: Not tested   Honey Thick Honey Thick Liquid: Not tested   Puree Puree: Not tested   Solid     Solid: Not tested     Hance Caspers L. Tivis Ringer, [place] Office number [phone] Pager [phone]  Juan Quam Laurice 10/05/2021,4:58 PM

## 2021-10-06 ENCOUNTER — Inpatient Hospital Stay (HOSPITAL_COMMUNITY): Payer: Medicare HMO

## 2021-10-06 DIAGNOSIS — R400 Somnolence: Secondary | ICD-10-CM | POA: Diagnosis not present

## 2021-10-06 DIAGNOSIS — G9340 Encephalopathy, unspecified: Secondary | ICD-10-CM | POA: Diagnosis not present

## 2021-10-06 LAB — BASIC METABOLIC PANEL
Anion gap: 9 (ref 5–15)
BUN: 26 mg/dL — ABNORMAL HIGH (ref 8–23)
CO2: 25 mmol/L (ref 22–32)
Calcium: 8.3 mg/dL — ABNORMAL LOW (ref 8.9–10.3)
Chloride: 119 mmol/L — ABNORMAL HIGH (ref 98–111)
Creatinine, Ser: 1 mg/dL (ref 0.61–1.24)
GFR, Estimated: >60 mL/min (ref >60)
Glucose, Bld: 95 mg/dL (ref 70–99)
Potassium: 4.8 mmol/L (ref 3.5–5.1)
Sodium: 153 mmol/L — ABNORMAL HIGH (ref 135–145)

## 2021-10-06 LAB — BLOOD GAS, ARTERIAL
Acid-Base Excess: 4.9 mmol/L — ABNORMAL HIGH (ref 0.0–2.0)
Bicarbonate: 27.8 mmol/L (ref 20.0–28.0)
Drawn by: 270211
FIO2: 21
O2 Saturation: 94.1 %
Patient temperature: 98.6
pCO2 arterial: 35.8 mmHg (ref 32.0–48.0)
pH, Arterial: 7.502 — ABNORMAL HIGH (ref 7.350–7.450)
pO2, Arterial: 67.5 mmHg — ABNORMAL LOW (ref 83.0–108.0)

## 2021-10-06 LAB — CBC
HCT: 43.5 % (ref 39.0–52.0)
Hemoglobin: 13.7 g/dL (ref 13.0–17.0)
MCH: 29.7 pg (ref 26.0–34.0)
MCHC: 31.5 g/dL (ref 30.0–36.0)
MCV: 94.2 fL (ref 80.0–100.0)
Platelets: 318 10*3/uL (ref 150–400)
RBC: 4.62 MIL/uL (ref 4.22–5.81)
RDW: 13.2 % (ref 11.5–15.5)
WBC: 12.5 10*3/uL — ABNORMAL HIGH (ref 4.0–10.5)
nRBC: 0 % (ref 0.0–0.2)

## 2021-10-06 LAB — COMPREHENSIVE METABOLIC PANEL
ALT: 16 U/L (ref 0–44)
AST: 21 U/L (ref 15–41)
Albumin: 2.6 g/dL — ABNORMAL LOW (ref 3.5–5.0)
Alkaline Phosphatase: 58 U/L (ref 38–126)
Anion gap: 3 — ABNORMAL LOW (ref 5–15)
BUN: 29 mg/dL — ABNORMAL HIGH (ref 8–23)
CO2: 28 mmol/L (ref 22–32)
Calcium: 7.8 mg/dL — ABNORMAL LOW (ref 8.9–10.3)
Chloride: 120 mmol/L — ABNORMAL HIGH (ref 98–111)
Creatinine, Ser: 0.95 mg/dL (ref 0.61–1.24)
GFR, Estimated: >60 mL/min (ref >60)
Glucose, Bld: 139 mg/dL — ABNORMAL HIGH (ref 70–99)
Potassium: 3.8 mmol/L (ref 3.5–5.1)
Sodium: 151 mmol/L — ABNORMAL HIGH (ref 135–145)
Total Bilirubin: 0.8 mg/dL (ref 0.3–1.2)
Total Protein: 6.3 g/dL — ABNORMAL LOW (ref 6.5–8.1)

## 2021-10-06 LAB — STREP PNEUMONIAE URINARY ANTIGEN: Strep Pneumo Urinary Antigen: NEGATIVE

## 2021-10-06 LAB — HEPARIN LEVEL (UNFRACTIONATED)
Heparin Unfractionated: 0.53 IU/mL (ref 0.30–0.70)
Heparin Unfractionated: 0.1 IU/mL — ABNORMAL LOW (ref 0.30–0.70)

## 2021-10-06 LAB — LACTIC ACID, PLASMA: Lactic Acid, Venous: 1.7 mmol/L (ref 0.5–1.9)

## 2021-10-06 LAB — BRAIN NATRIURETIC PEPTIDE: B Natriuretic Peptide: 394 pg/mL — ABNORMAL HIGH (ref 0.0–100.0)

## 2021-10-06 LAB — D-DIMER, QUANTITATIVE: D-Dimer, Quant: 11.16 ug/mL-FEU — ABNORMAL HIGH (ref 0.00–0.50)

## 2021-10-06 LAB — MAGNESIUM: Magnesium: 2.5 mg/dL — ABNORMAL HIGH (ref 1.7–2.4)

## 2021-10-06 LAB — PROCALCITONIN: Procalcitonin: 0.1 ng/mL

## 2021-10-06 LAB — PHOSPHORUS: Phosphorus: 2.3 mg/dL — ABNORMAL LOW (ref 2.5–4.6)

## 2021-10-06 MED ORDER — PIPERACILLIN-TAZOBACTAM 3.375 G IVPB
3.3750 g | Freq: Three times a day (TID) | INTRAVENOUS | Status: DC
  Administered 2021-10-06 – 2021-10-08 (×6): 3.375 g via INTRAVENOUS
  Filled 2021-10-06 (×7): qty 50

## 2021-10-06 MED ORDER — FREE WATER
200.0000 mL | Freq: Four times a day (QID) | Status: DC
  Administered 2021-10-07: 06:00:00 200 mL

## 2021-10-06 NOTE — Significant Event (Signed)
Rapid Response Event Note    Altered responsiveness and fever 103 F. Patient's responsiveness is consistent with current admission baseline. Lungs are congested with coughing. Provider reviewed chart and entered orders for suctioning prn, Tylenol suppository, and nebulizer treatments. Discussed condition with wife outside of room and updated with plan for treatment.  Administer Tylenol suppository as prescribed, keep room cool, apply ice packs, and monitor vital signs per MEWS protocol. Wife desires patient to remain full code including intubation if necessary.       Selinda Michaels, RN

## 2021-10-06 NOTE — Progress Notes (Addendum)
NTS pt per order. Copious amount of white thick sputum removed.

## 2021-10-06 NOTE — TOC Progression Note (Signed)
Transition of Care [facility]) - Progression Note    Patient Details  Name: Ian Weber MRN: [id number] Date of Birth: 1934-03-02  [facility] CM/SW Contact  Purcell Mouton, RN Phone Number: 10/06/2021, 11:00 AM  Clinical Narrative:     Pt from home with spouse and HHPT with Suzy Bouchard.  Expected Discharge Plan: [place] Barriers to Discharge: No Barriers Identified  Expected Discharge Plan and Services Expected Discharge Plan: [place] arrangements for the past 2 months: Single Family Home                                       Social Determinants of Health (SDOH) Interventions    Readmission Risk Interventions No flowsheet data found.

## 2021-10-06 NOTE — Progress Notes (Signed)
[place] for heparin Indication: acute pulmonary embolus  Allergies  Allergen Reactions   Amlodipine Other (See Comments)    Reaction not recalled by patient   Chlorthalidone Other (See Comments)    Reaction not recalled by patient   Codeine Phosphate Other (See Comments)    Weakness, sweating   Hydrochlorothiazide W-Triamterene Other (See Comments)    Reaction not recalled by patient   Prednisone Itching   Sulfamethoxazole Itching   Triamterene Other (See Comments)    Reaction not recalled by patient    Patient Measurements: Height: 6' (182.9 cm) Weight: 72.6 kg (160 lb) IBW/kg (Calculated) : 77.6 Heparin Dosing Weight: 73 kg  Vital Signs: Temp: 101.2 F (38.4 C) (12/29 0546) Temp Source: Axillary (12/29 0546) BP: 107/73 (12/29 0555) Pulse Rate: 80 (12/29 0555)  Labs: Recent Labs    10/04/21 1548 10/04/21 1852 10/05/21 0359 10/05/21 1603 10/05/21 2110 10/06/21 0729  HGB 16.2  --   --   --  14.7 13.7  HCT 51.5  --   --   --  46.1 43.5  PLT 359  --   --   --  331 318  HEPARINUNFRC  --   --   --   --  0.13* 0.53  CREATININE 1.01  --  0.77 0.76  --   --   TROPONINIHS 4,339* 2,885*  --   --   --   --      Estimated Creatinine Clearance: 69.3 mL/min (by C-G formula based on SCr of 0.76 mg/dL).   Assessment: Patient is an 87 y.o M who was recently diagnosed with COVID-19 presented to the ED on 10/04/21 with AMS. Troponin was found to be elevated, but cardiology does not think clinical picture is consistent with ACS.  D-dimer on 10/05/21 was found to be >20 and chest CTA came back positive for bilateral PE.  He's currently on heparin drip for VTE treatment.  Today, 10/06/2021: - heparin level is therapeutic at 0.53 after rate increased to 1350 units/hr - Ddimer down 11.16 - cbc ok - no bleeding documented  Goal of Therapy:  Heparin level 0.3-0.7 units/ml Monitor platelets by anticoagulation protocol: Yes   Plan:  -  continue heparin drip at 1350 units/hr - re-check another heparin level at 4pm to ensure level is still at goal before changing to daily monitoring - monitor for s/sx bleeding - f/u LE doppler  Shondell Poulson P 10/06/2021,8:27 AM

## 2021-10-06 NOTE — Progress Notes (Signed)
Pharmacy Antibiotic Note  Ian Weber is a 87 y.o. male Patient is an 87 y.o M who was recently diagnosed with COVID-19  presented to the ED on 10/04/21 with AMS. Pharmacy has been consulted to dose zosyn for aspiration PNA.  Plan: - zosyn 3.375 gm IV q8h (infuse over 4 hrs) - With stable renal function, pharmacy will sign off for abx consult.  Reconsult [place] if need further assistance.   _____________________________________________ Height: 6' (182.9 cm) Weight: 72.6 kg (160 lb) IBW/kg (Calculated) : 77.6  Temp (24hrs), Avg:100.7 F (38.2 C), Min:98.8 F (37.1 C), Max:103.1 F (39.5 C)  Recent Labs  Lab 10/04/21 1548 10/04/21 1641 10/04/21 2020 10/05/21 0359 10/05/21 1603 10/05/21 2110 10/06/21 0729  WBC 10.3  --   --   --   --  12.0* 12.5*  CREATININE 1.01  --   --  0.77 0.76  --   --   LATICACIDVEN  --  2.0* 2.1*  --   --   --   --     Estimated Creatinine Clearance: 69.3 mL/min (by C-G formula based on SCr of 0.76 mg/dL).    Allergies  Allergen Reactions   Amlodipine Other (See Comments)    Reaction not recalled by patient   Chlorthalidone Other (See Comments)    Reaction not recalled by patient   Codeine Phosphate Other (See Comments)    Weakness, sweating   Hydrochlorothiazide W-Triamterene Other (See Comments)    Reaction not recalled by patient   Prednisone Itching   Sulfamethoxazole Itching   Triamterene Other (See Comments)    Reaction not recalled by patient      Thank you for allowing pharmacy to be a part of this patients care.  Lynelle Doctor 10/06/2021 8:25 AM

## 2021-10-06 NOTE — Progress Notes (Addendum)
PROGRESS NOTE  LINDELL RENFREW VVO:[id number] DOB: 03-03-1934 DOA: 10/04/2021 PCP: Jilda Panda, MD  HPI/Recap of past 24 hours: Ian Weber is a 87 y.o. male with medical history significant for dementia, hypertension, hypothyroidism, history of GI bleed who presents from home with concerns of increasing altered mental status and generalized weakness.  Associated with poor appetite and poor oral intake.  Was brought to the ED for further evaluation.  Found to be COVID-19 positive with electrolytes abnormality.  Started on antiviral IV remdesivir.  10/05/2021: Patient was seen and examined at his bedside.  His wife was present in the room.  Per his wife he is not back to his baseline mentation.  He is weak appearing.  D-dimer elevated this morning greater than 20.  Patient started on heparin drip, CTA PE and bilateral Doppler ultrasound ordered to rule out venous thromboembolism.  10/06/2021: Patient was seen and examined at bedside.  He is minimally responsive.  Responds to painful stimuli.  Seen by PCCM, appreciate recommendations.   Assessment/Plan: Principal Problem:   AMS (altered mental status) Active Problems:   Pressure injury of skin   COVID-19 virus infection   Elevated troponin   Dementia without behavioral disturbance (HCC)   HTN (hypertension)   Acute metabolic encephalopathy, multifactorial secondary to COVID-19 viral infection and hypernatremia from poor oral intake, dehydration.  CT head nonacute Reorient as needed Treat underlying conditions NG tube placement for hydration.  Newly diagnosed bilateral acute pulmonary embolism Started on heparin drip on 10/05/2021, continue No evidence of right heart strain  Acute hypoxic respiratory failure secondary to above Maintaining saturation greater than 92% Hypoxemia seen on ABG 10/06/2021  Elevated troponin in the setting of covid 19 infection and bilateral pulmonary embolism Trop S peaked at >4300 and down  trended Continue to monitor on telemetry  Covid-19 viral infection, POA D-dimer>20 Trend inflammatory markers  Elevated D-dimer, r/o thromboembolism D-dimer>20 10/05/21 Start Hep drip until PE or DVT are ruled out Follow results  Hypernatremia, hypovolemic Started D5KCL 40 meq at 50cc/hr x 2 days, Increase rate to 100 cc/h Phlebotomist having difficulty obtaining blood draws. NG tube placement for free water flushes.  Resolved post repletion: Hypokalemia K+ 3.2> 3.8 Replete electrolytes as indicated.  Dementia Continue fall/aspiration/delirium precautions  Goals of care Palliative care team consulted to assist with establishing goals of care Currently full code per his wife  Pressure Injury 10/04/21 Buttocks Bilateral Stage 2 -  Partial thickness loss of dermis presenting as a shallow open injury with a red, pink wound bed without slough. (Active) 10/04/21 2300 Location: Buttocks Location Orientation: Bilateral Staging: Stage 2 -  Partial thickness loss of dermis presenting as a shallow open injury with a red, pink wound bed without slough. Wound Description (Comments): Present on Admission:Yes    Code Status: Full code   Family Communication: Updated his wife via phone on 10/06/2021.  Disposition Plan: Likely to return to home when hemodynamically stable and close to baseline.  Ongoing work up to r/o PE/DVT or WMA with elevated troponins.    Consultants: Palliative care team  Procedures: 2D echo   Antimicrobials: Antiviral Remdesivir   DVT prophylaxis:  Hep drip  Status is: Inpatient  Patient requires at least 2 midnights for further evaluation and treatment of present conditions.        Objective: Vitals:   10/06/21 0555 10/06/21 0956 10/06/21 1325 10/06/21 1745  BP: 107/73 (!) 150/81 93/73 98/80   Pulse: 80 77 76   Resp: 20 18 18  16  Temp:  98.7 F (37.1 C) 98.1 F (36.7 C) 97.6 F (36.4 C)  TempSrc:  Oral Axillary Axillary  SpO2: 97%  100% 100%   Weight:      Height:        Intake/Output Summary (Last 24 hours) at 10/06/2021 1816 Last data filed at 10/06/2021 0174 Gross per 24 hour  Intake 1635.06 ml  Output 525 ml  Net 1110.06 ml   Filed Weights   10/04/21 1538  Weight: 72.6 kg    Exam:  General: 87 y.o. year-old male frail-appearing no acute distress.  He is minimally responsive, awakens to painful stimuli and returns to sleep. Cardiovascular: Regular rate and rhythm no rubs or gallops.   Respiratory: Diffuse rales bilaterally.  Poor inspiratory effort. Abdomen: Soft monitor normal bowel sounds.   Musculoskeletal: No lower extremity edema bilaterally.   Skin: No ulcerative lesions or rashes. Psychiatry: Unable to ASSESS mood due to obtundation. Neuro: Does not follow commands.   Data Reviewed: CBC: Recent Labs  Lab 10/04/21 1548 10/05/21 2110 10/06/21 0729  WBC 10.3 12.0* 12.5*  NEUTROABS 8.6*  --   --   HGB 16.2 14.7 13.7  HCT 51.5 46.1 43.5  MCV 94.7 94.7 94.2  PLT 359 331 944   Basic Metabolic Panel: Recent Labs  Lab 10/04/21 1548 10/05/21 0359 10/05/21 0836 10/05/21 1603 10/06/21 0729  NA 148* 150*  --  151* 151*  K 3.9 3.2*  --  3.5 3.8  CL 108 115*  --  116* 120*  CO2 27 27  --  26 28  GLUCOSE 116* 111*  --  118* 139*  BUN 55* 45*  --  36* 29*  CREATININE 1.01 0.77  --  0.76 0.95  CALCIUM 9.4 8.5*  --  8.4* 7.8*  MG  --   --  2.5*  --  2.5*  PHOS  --   --   --   --  2.3*   GFR: Estimated Creatinine Clearance: 58.4 mL/min (by C-G formula based on SCr of 0.95 mg/dL). Liver Function Tests: Recent Labs  Lab 10/04/21 1548 10/06/21 0729  AST 33 21  ALT 20 16  ALKPHOS 87 58  BILITOT 1.0 0.8  PROT 9.0* 6.3*  ALBUMIN 4.0 2.6*   No results for input(s): LIPASE, AMYLASE in the last 168 hours. No results for input(s): AMMONIA in the last 168 hours. Coagulation Profile: No results for input(s): INR, PROTIME in the last 168 hours. Cardiac Enzymes: No results for input(s):  CKTOTAL, CKMB, CKMBINDEX, TROPONINI in the last 168 hours. BNP (last 3 results) No results for input(s): PROBNP in the last 8760 hours. HbA1C: No results for input(s): HGBA1C in the last 72 hours. CBG: No results for input(s): GLUCAP in the last 168 hours. Lipid Profile: No results for input(s): CHOL, HDL, LDLCALC, TRIG, CHOLHDL, LDLDIRECT in the last 72 hours. Thyroid Function Tests: Recent Labs    10/04/21 1549  TSH 1.234   Anemia Panel: No results for input(s): VITAMINB12, FOLATE, FERRITIN, TIBC, IRON, RETICCTPCT in the last 72 hours. Urine analysis:    Component Value Date/Time   COLORURINE YELLOW 10/05/2021 0631   APPEARANCEUR HAZY (A) 10/05/2021 0631   LABSPEC 1.028 10/05/2021 0631   PHURINE 5.0 10/05/2021 0631   GLUCOSEU NEGATIVE 10/05/2021 0631   HGBUR SMALL (A) 10/05/2021 0631   BILIRUBINUR NEGATIVE 10/05/2021 0631   KETONESUR 5 (A) 10/05/2021 0631   PROTEINUR NEGATIVE 10/05/2021 0631   UROBILINOGEN 0.2 11/19/2012 1138   NITRITE NEGATIVE 10/05/2021  0631   LEUKOCYTESUR NEGATIVE 10/05/2021 0631   Sepsis Labs: @LABRCNTIP (procalcitonin:4,lacticidven:4)  ) Recent Results (from the past 240 hour(s))  Culture, blood (routine x 2)     Status: None   Collection Time: 09/28/21  7:43 PM   Specimen: BLOOD RIGHT ARM  Result Value Ref Range Status   Specimen Description BLOOD RIGHT ARM  Final   Special Requests   Final    BOTTLES DRAWN AEROBIC AND ANAEROBIC Blood Culture results may not be optimal due to an inadequate volume of blood received in culture bottles   Culture   Final    NO GROWTH 5 DAYS Performed at [facility], [place] [address], [place], [place] [address]    Report Status 10/03/2021 FINAL  Final  Resp Panel by RT-PCR (Flu A&B, Covid) Peripheral     Status: Abnormal   Collection Time: 09/28/21  8:31 PM   Specimen: Peripheral; Nasopharyngeal(NP) swabs in vial transport medium  Result Value Ref Range Status   SARS Coronavirus 2 by RT PCR POSITIVE (A)  NEGATIVE Final    Comment: (NOTE) SARS-CoV-2 target nucleic acids are DETECTED.  The SARS-CoV-2 RNA is generally detectable in upper respiratory specimens during the acute phase of infection. Positive results are indicative of the presence of the identified virus, but do not rule out bacterial infection or co-infection with other pathogens not detected by the test. Clinical correlation with patient history and other diagnostic information is necessary to determine patient infection status. The expected result is Negative.  Fact Sheet for Patients: EntrepreneurPulse.com.au  Fact Sheet for Healthcare Providers: IncredibleEmployment.be  This test is not yet approved or cleared by the [place] FDA and  has been authorized for detection and/or diagnosis of SARS-CoV-2 by FDA under an Emergency Use Authorization (EUA).  This EUA will remain in effect (meaning this test can be used) for the duration of  the COVID-19 declaration under Section 564(b)(1) of the A ct, 21 U.S.C. section 360bbb-3(b)(1), unless the authorization is terminated or revoked sooner.     Influenza A by PCR NEGATIVE NEGATIVE Final   Influenza B by PCR NEGATIVE NEGATIVE Final    Comment: (NOTE) The Xpert Xpress SARS-CoV-2/FLU/RSV plus assay is intended as an aid in the diagnosis of influenza from Nasopharyngeal swab specimens and should not be used as a sole basis for treatment. Nasal washings and aspirates are unacceptable for Xpert Xpress SARS-CoV-2/FLU/RSV testing.  Fact Sheet for Patients: EntrepreneurPulse.com.au  Fact Sheet for Healthcare Providers: IncredibleEmployment.be  This test is not yet approved or cleared by the [place] FDA and has been authorized for detection and/or diagnosis of SARS-CoV-2 by FDA under an Emergency Use Authorization (EUA). This EUA will remain in effect (meaning this test can be used) for the  duration of the COVID-19 declaration under Section 564(b)(1) of the Act, 21 U.S.C. section 360bbb-3(b)(1), unless the authorization is terminated or revoked.  Performed at [facility], [place] [address], [place], [place] [address]   Culture, blood (routine x 2)     Status: None   Collection Time: 09/28/21  8:35 PM   Specimen: BLOOD RIGHT HAND  Result Value Ref Range Status   Specimen Description BLOOD RIGHT HAND  Final   Special Requests   Final    BOTTLES DRAWN AEROBIC AND ANAEROBIC Blood Culture results may not be optimal due to an inadequate volume of blood received in culture bottles   Culture   Final    NO GROWTH 5 DAYS Performed at [facility]  Hospital Lab, [place] [address] [place], [place] [address]    Report Status 10/03/2021 FINAL  Final      Studies: No results found.  Scheduled Meds:  aspirin  81 mg Oral Daily   levothyroxine  100 mcg Oral Q0600    Continuous Infusions:  dextrose 5 % with kcl 100 mL/hr at 10/06/21 1631   heparin 1,350 Units/hr (10/06/21 0545)   piperacillin-tazobactam (ZOSYN)  IV 3.375 g (10/06/21 1632)   remdesivir 100 mg in NS 100 mL 100 mg (10/06/21 0956)     LOS: 2 days     Kayleen Memos, MD [facility] Pager [phone]  If 7PM-7AM, please contact night-coverage [email or web address] Password [facility] 10/06/2021, 6:16 PM

## 2021-10-06 NOTE — Consult Note (Signed)
NAME:  Ian Weber, MRN:  [id number], DOB:  12-17-1933, LOS: 2 ADMISSION DATE:  10/04/2021, CONSULTATION DATE:  10/06/21 REFERRING MD:  Dr Irene Pap [facility], CHIEF COMPLAINT:  Acute encephalopathy   History of Present Illness:   87 year old male with a past medical history significant for dementia not otherwise specified but it appears that he has significant physical limitation and can only transfer from bed to the chair and dependent on wife for all ADLs.  Usually alert at baseline but not oriented  Also hypertension for which she is on home Lasix, hypothyroidism history of GI bleed.  Tested positive for COVID PCR 09/28/2021.  Since then having decreased appetite and progressively becoming bedbound with failure to thrive with resulting in grunting and not able to carry conversation and decreased responsiveness  Admitted 10/04/2021 with acute metabolic encephalopathy secondary to COVID secondary to dehydration and associated hyponatremia found to have elevated troponin 4300 consider demand ischemia.  Admission sodium was 148.  Since then started on free water and Lasix held but is having progressive hyponatremia sodium 151.  Blood gases show worsening respiratory alkalosis metabolic alkalosis.  There is also possible mild acute kidney injury mild at admission [baseline creatinine 0.8 mg percent and elevated to 1.01 mg percent at [place].  During admission he had CT angiogram which was positive for pulmonary embolism in the left lower lobe consolidation and an incompletely imaged 3.1 cm?  Left adrenal mass.    Through the hospital course is persistent obtunded encephalopathy with rising sodium 151. And requiring frequent NT suctioning and therefore critical care medicine consulted 10/06/2021.   Past Medical History:    has a past medical history of Anxiety, Chronic venous stasis, Depression, Diverticulosis, Diverticulosis of colon (without mention of hemorrhage), Esophageal reflux, GERD  (gastroesophageal reflux disease), Hiatal hernia, History of BPH, Hypothyroidism, Irritable bowel syndrome, Lactose intolerance, Stricture and stenosis of esophagus, Thyroid cancer ([place]), and Vitamin D insufficiency.   reports that he has never smoked. He has never used smokeless tobacco.  Past Surgical History:  Procedure Laterality Date   CARDIOVERSION  08/08/2012   Procedure: CARDIOVERSION;  Surgeon: Laverda Page, MD;  Location: [facility];  Service: Cardiovascular;  Laterality: N/A;   CATARACT EXTRACTION     COLONOSCOPY     COLONOSCOPY WITH PROPOFOL N/A 09/25/2017   Procedure: COLONOSCOPY WITH PROPOFOL;  Surgeon: Gatha Mayer, MD;  Location: [facility];  Service: Endoscopy;  Laterality: N/A;  moderate sedation acceptable also   HERNIA REPAIR  1992   THYROIDECTOMY  1990   Dr Deon Pilling    Allergies  Allergen Reactions   Amlodipine Other (See Comments)    Reaction not recalled by patient   Chlorthalidone Other (See Comments)    Reaction not recalled by patient   Codeine Phosphate Other (See Comments)    Weakness, sweating   Hydrochlorothiazide W-Triamterene Other (See Comments)    Reaction not recalled by patient   Prednisone Itching   Sulfamethoxazole Itching   Triamterene Other (See Comments)    Reaction not recalled by patient     There is no immunization history on file for this patient.  Family History  Family history unknown: Yes     Current Facility-Administered Medications:    acetaminophen (TYLENOL) suppository 650 mg, 650 mg, Rectal, Q4H PRN, Kathryne Eriksson, NP, 650 mg at 10/06/21 0550   aspirin chewable tablet 81 mg, 81 mg, Oral, Daily, Tu, Ching T, DO   dextrose 5 % 1,000 mL with potassium chloride 40 mEq  infusion, , Intravenous, Continuous, Kayleen Memos, [place], Paused at 10/06/21 7673   heparin ADULT infusion 100 units/mL (25000 units/265mL), 1,350 Units/hr, Intravenous, Continuous, Angela Adam, [facility], Last Rate: 13.5 mL/hr at 10/06/21 0545, 1,350  Units/hr at 10/06/21 0545   levalbuterol (XOPENEX) nebulizer solution 0.63 mg, 0.63 mg, Nebulization, Q6H PRN, Kathryne Eriksson, NP   levothyroxine (SYNTHROID) tablet 100 mcg, 100 mcg, Oral, Q0600, Tu, Ching T, DO   piperacillin-tazobactam (ZOSYN) IVPB 3.375 g, 3.375 g, Intravenous, Q8H, Pham, Anh P, RPH, Last Rate: 12.5 mL/hr at 10/06/21 1056, 3.375 g at 10/06/21 1056   [COMPLETED] remdesivir 100 mg in sodium chloride 0.9 % 100 mL IVPB, 100 mg, Intravenous, Once, Last Rate: 200 mL/hr at 10/04/21 2302, 100 mg at 10/04/21 2302 **FOLLOWED BY** [COMPLETED] remdesivir 100 mg in sodium chloride 0.9 % 100 mL IVPB, 100 mg, Intravenous, Once, Last Rate: 200 mL/hr at 10/05/21 0014, 100 mg at 10/05/21 0014 **FOLLOWED BY** remdesivir 100 mg in sodium chloride 0.9 % 100 mL IVPB, 100 mg, Intravenous, Daily, Tu, Ching T, DO, Last Rate: 200 mL/hr at 10/06/21 0956, 100 mg at 10/06/21 [place] Hospital Events:  10/04/2021 - admit 12/29 - ccm consult  Micro Data:  12/21 - covid positive  Antimicrobials:   Anti-infectives (From admission, onward)    Start     Dose/Rate Route Frequency Ordered Stop   10/06/21 0900  piperacillin-tazobactam (ZOSYN) IVPB 3.375 g        3.375 g 12.5 mL/hr over 240 Minutes Intravenous Every 8 hours 10/06/21 0823     10/05/21 1000  remdesivir 100 mg in sodium chloride 0.9 % 100 mL IVPB       See Hyperspace for full Linked Orders Report.   100 mg 200 mL/hr over 30 Minutes Intravenous Daily 10/04/21 2151 10/09/21 0959   10/04/21 2300  remdesivir 100 mg in sodium chloride 0.9 % 100 mL IVPB       See Hyperspace for full Linked Orders Report.   100 mg 200 mL/hr over 30 Minutes Intravenous  Once 10/04/21 2151 10/05/21 0044   10/04/21 2230  remdesivir 100 mg in sodium chloride 0.9 % 100 mL IVPB       See Hyperspace for full Linked Orders Report.   100 mg 200 mL/hr over 30 Minutes Intravenous  Once 10/04/21 2151 10/04/21 2332   10/04/21 2200  nirmatrelvir/ritonavir EUA  (PAXLOVID) 3 tablet  Status:  Discontinued        3 tablet Oral 2 times daily 10/04/21 2028 10/04/21 2131          Interim History / Subjective:   10/06/2021 - seen in bd 1440 at [place]  Objective   Blood pressure (!) 150/81, pulse 77, temperature 98.7 F (37.1 C), temperature source Oral, resp. rate 18, height 6' (1.829 m), weight 72.6 kg, SpO2 100 %.        Intake/Output Summary (Last 24 hours) at 10/06/2021 1131 Last data filed at 10/06/2021 0956 Gross per 24 hour  Intake 1431.98 ml  Output 550 ml  Net 881.98 ml   Filed Weights   10/04/21 1538  Weight: 72.6 kg    Examination: General: emaciated, lean male in bed. No distrsss HENT: mild gurgling respiration. No neck nodes Mouth looks dry Lungs: no distress. CTA bilaterally Cardiovascular: RRR Abdomen: abd. Soft no mas. No tender Extremities: intact, difffuse musclar wating Neuro: -He actually opened his eyes to voice and also try to mouth his name when questioned.  And then  he drifted off.  He has good protection of his airway with a good gag reflex.  Good muscle tone.  Baseline RASS appears to be -2 GU: Not examined   LABS    PULMONARY Recent Labs  Lab 10/04/21 1642 10/05/21 0901 10/06/21 0913  PHART  --  7.479* 7.502*  PCO2ART  --  36.6 35.8  PO2ART  --  70.5* 67.5*  HCO3 31.2* 26.9 27.8  O2SAT 27.6 94.1 94.1    CBC Recent Labs  Lab 10/04/21 1548 10/05/21 2110 10/06/21 0729  HGB 16.2 14.7 13.7  HCT 51.5 46.1 43.5  WBC 10.3 12.0* 12.5*  PLT 359 331 318    COAGULATION No results for input(s): INR in the last 168 hours.  CARDIAC  No results for input(s): TROPONINI in the last 168 hours. No results for input(s): PROBNP in the last 168 hours.   CHEMISTRY Recent Labs  Lab 10/04/21 1548 10/05/21 0359 10/05/21 0836 10/05/21 1603 10/06/21 0729  NA 148* 150*  --  151* 151*  K 3.9 3.2*  --  3.5 3.8  CL 108 115*  --  116* 120*  CO2 27 27  --  26 28  GLUCOSE 116* 111*  --  118* 139*   BUN 55* 45*  --  36* 29*  CREATININE 1.01 0.77  --  0.76 0.95  CALCIUM 9.4 8.5*  --  8.4* 7.8*  MG  --   --  2.5*  --  2.5*  PHOS  --   --   --   --  2.3*   Estimated Creatinine Clearance: 58.4 mL/min (by C-G formula based on SCr of 0.95 mg/dL).   LIVER Recent Labs  Lab 10/04/21 1548 10/06/21 0729  AST 33 21  ALT 20 16  ALKPHOS 87 58  BILITOT 1.0 0.8  PROT 9.0* 6.3*  ALBUMIN 4.0 2.6*     INFECTIOUS Recent Labs  Lab 10/04/21 1641 10/04/21 2020 10/05/21 0928 10/06/21 0729 10/06/21 1007  LATICACIDVEN 2.0* 2.1*  --   --  1.7  PROCALCITON  --   --  <0.10 0.10  --      ENDOCRINE CBG (last 3)  No results for input(s): GLUCAP in the last 72 hours.       IMAGING x48h  - image(s) personally visualized  -   highlighted in bold CT Head Wo Contrast  Result Date: 10/04/2021 CLINICAL DATA:  87 year old male with a history of mental status changes EXAM: CT HEAD WITHOUT CONTRAST TECHNIQUE: Contiguous axial images were obtained from the base of the skull through the vertex without intravenous contrast. COMPARISON:  None. FINDINGS: Brain: No acute intracranial hemorrhage. No midline shift or mass effect. Gray-white differentiation maintained. Confluent hypodensity in the bilateral periventricular white matter predominantly posteriorly. Unremarkable appearance of the ventricular system. Vascular: Calcifications of the intracranial vasculature. Skull: No acute fracture.  No aggressive bone lesion identified. Sinuses/Orbits: Unremarkable appearance of the orbits. Mastoid air cells clear. No middle ear effusion. No significant sinus disease. Other: None IMPRESSION: Negative for acute intracranial abnormality. Changes of chronic microvascular ischemic disease and associated intracranial atherosclerosis Electronically Signed   By: Corrie Mckusick D.O.   On: 10/04/2021 17:12   CT Angio Chest Pulmonary Embolism (PE) W or WO Contrast  Result Date: 10/05/2021 CLINICAL DATA:  Pulmonary  embolism suspected, high probability. COVID positive. Elevated D-dimer. EXAM: CT ANGIOGRAPHY CHEST WITH CONTRAST TECHNIQUE: Multidetector CT imaging of the chest was performed using the standard protocol during bolus administration of intravenous contrast. Multiplanar CT image  reconstructions and MIPs were obtained to evaluate the vascular anatomy. CONTRAST:  40mL OMNIPAQUE IOHEXOL 350 MG/ML SOLN COMPARISON:  Chest radiograph 10/04/2021 FINDINGS: Cardiovascular: Positive for pulmonary embolism. Lobar and segmental clot involving left upper and left lower lobes. Lobar and segmental clot in the right upper lobe. There is probably a small amount of distal thrombus in the right lower lobe but this area is limited due to motion artifact. No thrombus in the main pulmonary arteries. Limited evaluation for right heart strain due to motion artifact but overall there does not appear to be significant right heart strain. Heart size is normal without significant pericardial fluid. Normal caliber of the thoracic aorta. Mediastinum/Nodes: Evidence for thyroidectomy. No significant mediastinal or hilar lymphadenopathy. No axillary lymph node enlargement. Lungs/Pleura: Focal consolidation and airspace densities in the posterior left lower lobe. No pleural effusions. Few patchy densities in the posterior right lower lobe may represent atelectasis. Upper Abdomen: Partially imaged low-density structure near the left adrenal gland on sequence 5 image 103 that measures 3.1 x 2.5 cm. This lesion appears to be separate from the pancreas but the entire upper abdomen is poorly visualized. Musculoskeletal: No acute bone abnormality. Review of the MIP images confirms the above findings. IMPRESSION: 1. Positive for bilateral pulmonary embolism. Clot burden is small to moderate in size and primarily involving segmental branches. Limited evaluation for right heart strain although there does not appear to be significant right heart strain on this  examination. If there is clinical concern for right heart strain, recommend further characterization with echocardiography. 2. Consolidation in the posterior left lower lobe. Findings could be related to infarct and/or pneumonia. 3. Indeterminate low-density structure/lesion in the left upper abdomen near the left adrenal gland. This area is incompletely imaged but measures up to 3.1 cm. Neoplastic process cannot be excluded. Recommend dedicated imaging of the abdomen and pelvis. These results were called by telephone at the time of interpretation on 10/05/2021 at 3:02 pm to provider [facility] , who verbally acknowledged these results. Electronically Signed   By: Markus Daft M.D.   On: 10/05/2021 15:03   DG Chest Port 1 View  Result Date: 10/04/2021 CLINICAL DATA:  Altered mental status COVID EXAM: PORTABLE CHEST 1 VIEW COMPARISON:  09/28/2021 FINDINGS: Postsurgical changes at the thoracic inlet. No focal opacity, pleural effusion or pneumothorax. Normal cardiomediastinal silhouette. IMPRESSION: No active disease. Electronically Signed   By: Donavan Foil M.D.   On: 10/04/2021 17:21   ECHOCARDIOGRAM COMPLETE  Result Date: 10/05/2021    ECHOCARDIOGRAM REPORT   Patient Name:   BROOK MALL Avery Date of Exam: 10/05/2021 Medical Rec #:  [id number]     Height:       72.0 in Accession #:    [id number]    Weight:       160.0 lb Date of Birth:  April 05, 1934     BSA:          1.938 m Patient Age:    54 years      BP:           109/83 mmHg Patient Gender: M             HR:           94 bpm. Exam Location:  Inpatient Procedure: 2D Echo and Intracardiac Opacification Agent Indications:    Elevated Troponin  History:        Patient has no prior history of Echocardiogram examinations.  Risk Factors:Hypertension.  Sonographer:    Glo Herring Referring Phys: [id number] Rice T TU  Sonographer Comments: Technically difficult study due to poor echo windows. Very poor apical windows despite use of definity contrast  IMPRESSIONS  1. Endocardial border definition is poor despite the use of Definity contrast. Overall, systolic function appears to be normal. Left ventricular ejection fraction, by estimation, is 55 to 60%. The left ventricle has normal function. The left ventricle has no regional wall motion abnormalities. Left ventricular diastolic parameters are consistent with Grade I diastolic dysfunction (impaired relaxation).  2. Right ventricular systolic function is normal. The right ventricular size is normal. There is normal pulmonary artery systolic pressure.  3. The mitral valve is normal in structure. No evidence of mitral valve regurgitation. No evidence of mitral stenosis.  4. The aortic valve is normal in structure. Aortic valve regurgitation is not visualized. No aortic stenosis is present.  5. The inferior vena cava is dilated in size with >50% respiratory variability, suggesting right atrial pressure of 8 mmHg. FINDINGS  Left Ventricle: Endocardial border definition is poor despite the use of Definity contrast. Overall, systolic function appears to be normal. Left ventricular ejection fraction, by estimation, is 55 to 60%. The left ventricle has normal function. The left ventricle has no regional wall motion abnormalities. Definity contrast agent was given IV to delineate the left ventricular endocardial borders. The left ventricular internal cavity size was normal in size. There is no left ventricular hypertrophy. Left ventricular diastolic parameters are consistent with Grade I diastolic dysfunction (impaired relaxation). Normal left ventricular filling pressure. Right Ventricle: The right ventricular size is normal. No increase in right ventricular wall thickness. Right ventricular systolic function is normal. There is normal pulmonary artery systolic pressure. The tricuspid regurgitant velocity is 2.42 m/s, and  with an assumed right atrial pressure of 8 mmHg, the estimated right ventricular systolic pressure  is 84.1 mmHg. Left Atrium: Left atrial size was normal in size. Right Atrium: Right atrial size was normal in size. Pericardium: There is no evidence of pericardial effusion. Mitral Valve: The mitral valve is normal in structure. No evidence of mitral valve regurgitation. No evidence of mitral valve stenosis. Tricuspid Valve: The tricuspid valve is normal in structure. Tricuspid valve regurgitation is mild . No evidence of tricuspid stenosis. Aortic Valve: The aortic valve is normal in structure. Aortic valve regurgitation is not visualized. No aortic stenosis is present. Pulmonic Valve: The pulmonic valve was normal in structure. Pulmonic valve regurgitation is not visualized. No evidence of pulmonic stenosis. Aorta: The aortic root is normal in size and structure. Venous: The inferior vena cava is dilated in size with greater than 50% respiratory variability, suggesting right atrial pressure of 8 mmHg. IAS/Shunts: No atrial level shunt detected by color flow Doppler.  LEFT VENTRICLE PLAX 2D LVOT diam:     2.10 cm   Diastology LVOT Area:     3.46 cm  LV e' medial:    11.00 cm/s                          LV E/e' medial:  5.2                          LV e' lateral:   6.42 cm/s                          LV E/e' lateral: 8.9  RIGHT VENTRICLE            IVC RV S prime:     9.79 cm/s  IVC diam: 2.50 cm  AORTA Ao Root diam: 3.50 cm MITRAL VALVE               TRICUSPID VALVE MV Area (PHT): 5.50 cm    TR Peak grad:   23.4 mmHg MV Decel Time: 138 msec    TR Vmax:        242.00 cm/s MV E velocity: 57.40 cm/s MV A velocity: 74.60 cm/s  SHUNTS MV E/A ratio:  0.77        Systemic Diam: 2.10 cm Skeet Latch MD Electronically signed by Skeet Latch MD Signature Date/Time: 10/05/2021/2:28:36 PM    Final       Resolved Hospital Problem list   x  Assessment & Plan:  ASSESSMENT / PLAN:  PULMONARY  A:  Mixed metabolic and respiratory alkalosis -present on admission  Left lower lobe consolidation present on  admission -differential diagnosis is PE related infarct versus aspiration versus COVID-19  Subsegmental PE =-present on admission  10/06/2021 -> worse.  Likely due to dehydration intravascular with hyponatremia.  Protecting airway and no imminent need for intubation  P:   Monitor closely Frequent NT suctioning per protocol IV heparin for pulmonary embolism according to hospitalist   NEUROLOGIC A:   Baseline severe dementia -bedbound with limited ability to transfer Acute encephalopathy -present on admission.  Likely due to dehydration and hyponatremia  P:   Correct metabolic issues    VASCULAR A:   Known hypertension on Lasix  P:  Monitoring according to the hospitalist  CARDIAC STRUCTURAL A: Normal echocardiogram  P: According to the hospitalist monitoring    INFECTIOUS A:   COVID-19 prior to admission 09/28/2021 Concern for aspiration versus CAP left lower lobe at admission  P:   Antibiotics according to the hospitalist Check urine strep and urine Legionella   RENAL A:  Baseline creatinine of 0.7 mg percent-0.8 mg percent Possible acute kidney injury mild this admission given lean body mass index   P:  Monitor with hydration  ELECTROLYTES A:  Hypernatremia -at admission secondary to intravascular dehydration and dementia patient  10/06/2021: Worse  P: D5 water titration according to the hospitalist.  Currently hypernatremia would be importance of encephalopathy Can consider placing Panda and oral free water   GASTROINTESTINAL A:   Known dysphagia prior to admission Possible adrenal mass -diagnosed at admission  P:   CT abdomen on elective basis either as outpatient or prior to discharge but better [cancel CT abdomen with contrast today given concern for dehydration and renal injury]  HEMATOLOGIC   - HEME A:  Normal hemoglobin at admission   P:  - PRBC for hgb </= 6.9gm%    - exceptions are   -  if ACS susepcted/confirmed then  transfuse for hgb </= 8.0gm%,  or    -  active bleeding with hemodynamic instability, then transfuse regardless of hemoglobin value   At at all times try to transfuse 1 unit prbc as possible with exception of active hemorrhage   HEMATOLOGIC - Platelets A Normal platelets at admission  P Monitor  ENDOCRINE A:   At risk for hypo and hyperglycemia at admission P:   Monitor  MSK/DERM Advanced dementia, failure to thrive, protein calorie malnutrition, frailty - Prior to & Present on Admit     Best practice (daily eval):  Diet: per triad Pain/Anxiety/Delirium protocol (if indicated): per  triad VAP protocol (if indicated): no DVT prophylaxis: per triad GI prophylaxis: per tria Glucose control: per triad Mobility: bed rest Code Status: Full code but dNR appropriate Family Communication: per triad Disposition: can remain in 1440  -> move to ICU if worse       ATTESTATION & SIGNATURE   The patient [place] is critically ill with multiple organ systems failure and requires high complexity decision making for assessment and support, frequent evaluation and titration of therapies, application of advanced monitoring technologies and extensive interpretation of multiple databases.   Critical Care Time devoted to patient care services described in this note is  35  Minutes. This time reflects time of care of this signee Dr Brand Males. This critical care time does not reflect procedure time, or teaching time or supervisory time of PA/NP/Med student/Med Resident etc but could involve care discussion time      SIGNATURE    Dr. Brand Males, M.D., F.C.C.P,  Pulmonary and Critical Care Medicine Staff Physician, [place] Director - Interstitial Lung Disease  Program  Pulmonary [place] at [place], [place], [address]  NPI Number:  NPI #[id number]  Pager: [phone], If no answer  -> Check AMION or  Try [phone] Telephone (clinical office): [phone] Telephone (research): [phone]  11:31 AM 10/06/2021

## 2021-10-07 ENCOUNTER — Inpatient Hospital Stay (HOSPITAL_COMMUNITY): Payer: Medicare HMO

## 2021-10-07 DIAGNOSIS — Z66 Do not resuscitate: Secondary | ICD-10-CM | POA: Diagnosis not present

## 2021-10-07 DIAGNOSIS — Z515 Encounter for palliative care: Secondary | ICD-10-CM

## 2021-10-07 DIAGNOSIS — Z7189 Other specified counseling: Secondary | ICD-10-CM

## 2021-10-07 DIAGNOSIS — U071 COVID-19: Secondary | ICD-10-CM | POA: Diagnosis not present

## 2021-10-07 DIAGNOSIS — F0390 Unspecified dementia without behavioral disturbance: Secondary | ICD-10-CM | POA: Diagnosis not present

## 2021-10-07 DIAGNOSIS — R400 Somnolence: Secondary | ICD-10-CM | POA: Diagnosis not present

## 2021-10-07 LAB — BASIC METABOLIC PANEL
Anion gap: 7 (ref 5–15)
Anion gap: 5 (ref 5–15)
BUN: 25 mg/dL — ABNORMAL HIGH (ref 8–23)
BUN: 23 mg/dL (ref 8–23)
CO2: 25 mmol/L (ref 22–32)
CO2: 28 mmol/L (ref 22–32)
Calcium: 7.9 mg/dL — ABNORMAL LOW (ref 8.9–10.3)
Calcium: 7.9 mg/dL — ABNORMAL LOW (ref 8.9–10.3)
Chloride: 121 mmol/L — ABNORMAL HIGH (ref 98–111)
Chloride: 122 mmol/L — ABNORMAL HIGH (ref 98–111)
Creatinine, Ser: 0.98 mg/dL (ref 0.61–1.24)
Creatinine, Ser: 0.82 mg/dL (ref 0.61–1.24)
GFR, Estimated: >60 mL/min (ref >60)
GFR, Estimated: >60 mL/min (ref >60)
Glucose, Bld: 119 mg/dL — ABNORMAL HIGH (ref 70–99)
Glucose, Bld: 113 mg/dL — ABNORMAL HIGH (ref 70–99)
Potassium: 3.5 mmol/L (ref 3.5–5.1)
Potassium: 3.1 mmol/L — ABNORMAL LOW (ref 3.5–5.1)
Sodium: 153 mmol/L — ABNORMAL HIGH (ref 135–145)
Sodium: 155 mmol/L — ABNORMAL HIGH (ref 135–145)

## 2021-10-07 LAB — CBC
HCT: 41.9 % (ref 39.0–52.0)
Hemoglobin: 13 g/dL (ref 13.0–17.0)
MCH: 29.6 pg (ref 26.0–34.0)
MCHC: 31 g/dL (ref 30.0–36.0)
MCV: 95.4 fL (ref 80.0–100.0)
Platelets: 314 10*3/uL (ref 150–400)
RBC: 4.39 MIL/uL (ref 4.22–5.81)
RDW: 13.2 % (ref 11.5–15.5)
WBC: 14.3 10*3/uL — ABNORMAL HIGH (ref 4.0–10.5)
nRBC: 0.1 % (ref 0.0–0.2)

## 2021-10-07 LAB — MAGNESIUM: Magnesium: 2.7 mg/dL — ABNORMAL HIGH (ref 1.7–2.4)

## 2021-10-07 LAB — HEPARIN LEVEL (UNFRACTIONATED): Heparin Unfractionated: 0.44 IU/mL (ref 0.30–0.70)

## 2021-10-07 LAB — PHOSPHORUS: Phosphorus: 2.7 mg/dL (ref 2.5–4.6)

## 2021-10-07 LAB — SODIUM: Sodium: 151 mmol/L — ABNORMAL HIGH (ref 135–145)

## 2021-10-07 LAB — LEGIONELLA PNEUMOPHILA SEROGP 1 UR AG: L. pneumophila Serogp 1 Ur Ag: NEGATIVE

## 2021-10-07 MED ORDER — ACETAMINOPHEN 650 MG RE SUPP
650.0000 mg | RECTAL | Status: AC | PRN
  Administered 2021-10-07 – 2021-10-10 (×3): 650 mg via RECTAL
  Filled 2021-10-07 (×3): qty 1

## 2021-10-07 MED ORDER — POTASSIUM CL IN DEXTROSE 5% 20 MEQ/L IV SOLN
20.0000 meq | INTRAVENOUS | Status: DC
  Administered 2021-10-08: 20 meq via INTRAVENOUS
  Filled 2021-10-07 (×2): qty 1000

## 2021-10-07 MED ORDER — POTASSIUM CHLORIDE 10 MEQ/100ML IV SOLN
10.0000 meq | INTRAVENOUS | Status: AC
  Administered 2021-10-07 – 2021-10-08 (×3): 10 meq via INTRAVENOUS
  Filled 2021-10-07 (×2): qty 100

## 2021-10-07 MED ORDER — POTASSIUM CL IN DEXTROSE 5% 20 MEQ/L IV SOLN
20.0000 meq | INTRAVENOUS | Status: DC
  Administered 2021-10-07: 17:00:00 20 meq via INTRAVENOUS
  Filled 2021-10-07: qty 1000

## 2021-10-07 MED ORDER — FREE WATER
200.0000 mL | Status: DC
  Administered 2021-10-07 – 2021-10-08 (×9): 200 mL

## 2021-10-07 MED ORDER — LORAZEPAM 2 MG/ML IJ SOLN
0.5000 mg | Freq: Once | INTRAMUSCULAR | Status: AC
  Administered 2021-10-07: 01:00:00 0.5 mg via INTRAVENOUS
  Filled 2021-10-07: qty 1

## 2021-10-07 MED ORDER — FREE WATER
200.0000 mL | Status: DC
Start: 2021-10-07 — End: 2021-10-07
  Administered 2021-10-07 (×3): 200 mL

## 2021-10-07 MED ORDER — ZINC OXIDE 40 % EX OINT
TOPICAL_OINTMENT | Freq: Three times a day (TID) | CUTANEOUS | Status: DC
  Administered 2021-10-10: 1 via TOPICAL
  Filled 2021-10-07 (×2): qty 57

## 2021-10-07 NOTE — Consult Note (Signed)
Consultation Note Date: 10/07/2021   Patient Name: Ian Weber  DOB: [birth year]  MRN: [id number]  Age / Sex: 87 y.o., male  PCP: Jilda Panda, MD Referring Physician: Kayleen Memos, DO  Reason for Consultation: Establishing goals of care  HPI/Patient Profile: 87 y.o. male  with past medical history of dementia, hypertension, hypothyroidism, GIB admitted on 10/04/2021 with weakness - bedbound, altered mental status - no longer conversant, poor intake, labored breathing with known COVID infection.   Clinical Assessment and Goals of Care: I met today at Mr. Goyer bedside along with his wife. Wife has been caring for him at home and he has been declining from dementia prior to COVID infection but with significant decline since infection began. Wife reports that he could transfer and stand some and was eating mostly well. No signs of choking or trouble swallowing. She reports that he did not always know who she was and he was incontinent. She was able to get him in and out of tub to bathe him prior to his infection.   We discussed his current status is very critical. Mr. Iles is having severe trouble managing his secretions and congestion. He appears to be actively aspirating. I explained the seriousness of his condition is due to the severity of his illness and dementia now complicated by COVID. We discussed that patients with underlying dementia often do not do very well when they get COVID. We discussed concern for his swallow function and how his lungs and respiratory status are at great risk for decline. I explained that he very well may get worse instead of better and may not be able to make it back home with her. I paused to allow Ms. Romig to process and respond and she does say "he is in [facility]." She shares with me how her mother died without suffering and made the decision not to pursue CPR or  life support machines and died peacefully. We discussed resuscitation efforts and that if Mr. Isabell were to decline to this point that resuscitation would be very painful to him and he would be very unlikely to survive these measures. Mrs. Revelo was able to tell me that she does not want her husband to suffer and he is already suffering enough. She reiterates this to me multiple times. She is able to share that she had originally told Dr. Nevada Crane to "do everything" and wants me to be sure to update Dr. Nevada Crane of her decision that she does not want to put her husband through resuscitative efforts and agrees with DNR status. I attempted to obtain secondary emergency contact or another family member to update to help support the wife in decision making but she does not wish for any other family members to be contacted at this time.   **A nephew/pastor called during our conversation and she placed him on speaker phone to share update and conversation. I expressed to him the seriousness of Mr. Pote condition. He asks if Mr. Smail is on a breathing  machine and I shares that we were discussing at this time the limitations of escalating to interventions such as life support. He expressed understanding and spoke further with Ms. Racey.   All questions/concerns addressed. Emotional support provided.  Primary Decision Maker NEXT OF KIN wife    SUMMARY OF RECOMMENDATIONS   - DNR decided - Continue other medical measures at this time  Code Status/Advance Care Planning: DNR   Symptom Management:  Per attending.  Consider Robinul for excessive secretions.   Palliative Prophylaxis:  Aspiration, Bowel Regimen, Delirium Protocol, Frequent Pain Assessment, and Turn Reposition  Psycho-social/Spiritual:  Desire for further Chaplaincy support:yes Additional Recommendations: Education on Hospice and Grief/Bereavement Support  Prognosis:  Overall prognosis poor. Concern that he may not survive  hospitalization.   Discharge Planning: To Be Determined      Primary Diagnoses: Present on Admission:  AMS (altered mental status)   I have reviewed the medical record, interviewed the patient and family, and examined the patient. The following aspects are pertinent.  Past Medical History:  Diagnosis Date   Anxiety    Chronic venous stasis    Depression    Diverticulosis    Diverticulosis of colon (without mention of hemorrhage)    Esophageal reflux    GERD (gastroesophageal reflux disease)    Hiatal hernia    History of BPH    Hypothyroidism    Irritable bowel syndrome    Lactose intolerance    Stricture and stenosis of esophagus    Thyroid cancer (HCC)    Vitamin D insufficiency    Social History   Socioeconomic History   Marital status: Married    Spouse name: Cally    Number of children: 8   Years of education: 8   Highest education level: Not on file  Occupational History   Occupation: Retired  Tobacco Use   Smoking status: Never   Smokeless tobacco: Never  Vaping Use   Vaping Use: Never used  Substance and Sexual Activity   Alcohol use: No   Drug use: No   Sexual activity: Yes  Other Topics Concern   Not on file  Social History Narrative   Lives with wife   Caffeine use:  none   Social Determinants of Health   Financial Resource Strain: Not on file  Food Insecurity: Not on file  Transportation Needs: Not on file  Physical Activity: Not on file  Stress: Not on file  Social Connections: Not on file   Family History  Family history unknown: Yes   Scheduled Meds:  aspirin  81 mg Oral Daily   free water  200 mL Per Tube Q4H   levothyroxine  100 mcg Oral Q0600   liver oil-zinc oxide   Topical TID   Continuous Infusions:  heparin 1,350 Units/hr (10/07/21 0330)   piperacillin-tazobactam (ZOSYN)  IV 3.375 g (10/07/21 0933)   remdesivir 100 mg in NS 100 mL 100 mg (10/07/21 0816)   PRN Meds:.levalbuterol Allergies  Allergen Reactions    Amlodipine Other (See Comments)    Reaction not recalled by patient   Chlorthalidone Other (See Comments)    Reaction not recalled by patient   Codeine Phosphate Other (See Comments)    Weakness, sweating   Hydrochlorothiazide W-Triamterene Other (See Comments)    Reaction not recalled by patient   Prednisone Itching   Sulfamethoxazole Itching   Triamterene Other (See Comments)    Reaction not recalled by patient   Review of Systems  Unable to perform ROS: Acuity  of condition   Physical Exam Vitals and nursing note reviewed.  Constitutional:      Appearance: He is ill-appearing.     Comments: Thin, frail, elderly  Cardiovascular:     Rate and Rhythm: Normal rate.  Pulmonary:     Effort: No tachypnea or accessory muscle usage.     Breath sounds: Rhonchi present.     Comments: Actively aspirating on copious secretions poorly managed. Unable to protect his airway.  Abdominal:     Palpations: Abdomen is soft.  Neurological:     Comments: Withdraws to painful stimuli. Resists care. Does not open eyes. Makes no attempts at verbal response. Does not follow commands.     Vital Signs: BP 128/82 (BP Location: Right Arm)    Pulse 80    Temp 98.5 F (36.9 C) (Axillary)    Resp 19    Ht 6' (1.829 m)    Wt 72.6 kg    SpO2 100%    BMI 21.70 kg/m  Pain Scale: FLACC POSS *See Group Information*: 2-Acceptable,Slightly drowsy, easily aroused Pain Score: Asleep   SpO2: SpO2: 100 % O2 Device:SpO2: 100 % O2 Flow Rate: .   IO: Intake/output summary:  Intake/Output Summary (Last 24 hours) at 10/07/2021 1334 Last data filed at 10/07/2021 0038 Gross per 24 hour  Intake 222.77 ml  Output 275 ml  Net -52.23 ml    LBM: Last BM Date:  (pta) Baseline Weight: Weight: 72.6 kg Most recent weight: Weight: 72.6 kg     Palliative Assessment/Data:     Time In: 1400 Time Out: 1520 Time Total: 80 min Greater than 50%  of this time was spent counseling and coordinating care related to the  above assessment and plan.  Signed by: Vinie Sill, NP Palliative Medicine Team Pager # [phone] (M-F 8a-5p) Team Phone # [phone] (Nights/Weekends)

## 2021-10-07 NOTE — Progress Notes (Addendum)
PROGRESS NOTE  Ian Weber VFI:[id number] DOB: 1934-04-05 DOA: 10/04/2021 PCP: Jilda Panda, MD  HPI/Recap of past 24 hours: Ian Weber is a 87 y.o. male with medical history significant for dementia, hypertension, hypothyroidism, history of GI bleed who presents from home with concerns of increasing altered mental status and generalized weakness.  Associated with poor appetite and poor oral intake.  Was brought to the ED for further evaluation.  Found to be COVID-19 positive with electrolytes abnormality.  Started on antiviral IV remdesivir.  Hospital course complicated by findings of B/L pulmonary embolism, started hep drip 10/05/21. Minimal responsiveness, restless agitation delirium reported early morning 12/30.  10/07/2021: Seen at bedside still minimally responsive. His wife a bedside.  Ongoing goals of care discussion with palliative care.   Assessment/Plan: Principal Problem:   AMS (altered mental status) Active Problems:   Pressure injury of skin   COVID-19 virus infection   Elevated troponin   Dementia without behavioral disturbance (HCC)   HTN (hypertension)   Acute metabolic encephalopathy, multifactorial secondary to COVID-19 viral infection and hypernatremia from poor oral intake, dehydration.  CT head nonacute Reorient as needed Treat underlying conditions NG tube placement for hydration on 10/07/2021.  Worsening hypovolemic hypernatremia in the setting of poor oral intake Continue free water flushes 200 cc every 4 hours Continue IV fluid hydration D5 KCl at 50 cc/h x 2 days.  Newly diagnosed bilateral acute pulmonary embolism Started on heparin drip on 10/05/2021, continue No evidence of right heart strain  Acute hypoxic respiratory failure secondary to above Continue to maintain oxygen saturation greater than 92% Hypoxemia seen on ABG 10/06/2021  Elevated troponin in the setting of covid 19 infection and bilateral pulmonary embolism Trop S peaked at  >4300 and down trended Continue to monitor on telemetry  Prolonged QTC QTC greater than 500 on 12 EKG done on 10/04/2021 Repeat twelve-lead EKG Optimize magnesium and potassium levels Avoid QTC prolonging agents  Covid-19 viral infection, POA D-dimer>20 Trend inflammatory markers  Elevated D-dimer, r/o thromboembolism D-dimer>20 10/05/21 Start Hep drip until PE or DVT are ruled out Follow results  Resolved post repletion: Hypokalemia K+ 3.2> 3.8 Replete electrolytes as indicated.  Dementia Continue fall/aspiration/delirium precautions  Goals of care Palliative care team consulted to assist with establishing goals of care Ongoing discussion of goals of care respiratory treatment family.  DNR as of 10/07/2021  Pressure Injury 10/04/21 Buttocks Bilateral Stage 2 -  Partial thickness loss of dermis presenting as a shallow open injury with a red, pink wound bed without slough. (Active) 10/04/21 2300 Location: Buttocks Location Orientation: Bilateral Staging: Stage 2 -  Partial thickness loss of dermis presenting as a shallow open injury with a red, pink wound bed without slough. Wound Description (Comments): Present on Admission:Yes    Code Status: Full code   Family Communication: Updated his wife in person at bedside on 10/07/2021.  Disposition Plan: Likely to return to home when hemodynamically stable and close to baseline.  Ongoing work up to r/o PE/DVT or WMA with elevated troponins.    Consultants: Palliative care team  Procedures: 2D echo   Antimicrobials: Antiviral Remdesivir   DVT prophylaxis:  Hep drip  Status is: Inpatient  Patient requires at least 2 midnights for further evaluation and treatment of present conditions.        Objective: Vitals:   10/06/21 2013 10/06/21 2341 10/07/21 0045 10/07/21 0424  BP:  (!) 118/108  128/82  Pulse:  84  80  Resp:  19  Temp: 99.1 F (37.3 C) (!) 101 F (38.3 C) 99.5 F (37.5 C) 98.5 F (36.9 C)   TempSrc: Axillary Axillary Axillary Axillary  SpO2:      Weight:      Height:        Intake/Output Summary (Last 24 hours) at 10/07/2021 1607 Last data filed at 10/07/2021 0038 Gross per 24 hour  Intake 222.77 ml  Output 125 ml  Net 97.77 ml   Filed Weights   10/04/21 1538  Weight: 72.6 kg    Exam:  General: 87 y.o. year-old male frail-appearing no acute distress.  He is minimally interactive.   Cardiovascular: Regular rate and rhythm no rubs or gallops.   Respiratory: Mild rales at bases no wheezing noted.  Abdomen: Soft nontender normal bowel sounds present.   Musculoskeletal: No lower extremity edema bilaterally. Skin: No ulcerative lesions noted. Psychiatry: Unable to assess mood due to hypersomnolence. Neuro: Does not follow commands.   Data Reviewed: CBC: Recent Labs  Lab 10/04/21 1548 10/05/21 2110 10/06/21 0729 10/07/21 0428  WBC 10.3 12.0* 12.5* 14.3*  NEUTROABS 8.6*  --   --   --   HGB 16.2 14.7 13.7 13.0  HCT 51.5 46.1 43.5 41.9  MCV 94.7 94.7 94.2 95.4  PLT 359 331 318 741   Basic Metabolic Panel: Recent Labs  Lab 10/05/21 0359 10/05/21 0836 10/05/21 1603 10/06/21 0729 10/06/21 2106 10/07/21 0428  NA 150*  --  151* 151* 153* 153*  K 3.2*  --  3.5 3.8 4.8 3.5  CL 115*  --  116* 120* 119* 121*  CO2 27  --  26 28 25 25   GLUCOSE 111*  --  118* 139* 95 119*  BUN 45*  --  36* 29* 26* 25*  CREATININE 0.77  --  0.76 0.95 1.00 0.98  CALCIUM 8.5*  --  8.4* 7.8* 8.3* 7.9*  MG  --  2.5*  --  2.5*  --  2.7*  PHOS  --   --   --  2.3*  --  2.7   GFR: Estimated Creatinine Clearance: 56.6 mL/min (by C-G formula based on SCr of 0.98 mg/dL). Liver Function Tests: Recent Labs  Lab 10/04/21 1548 10/06/21 0729  AST 33 21  ALT 20 16  ALKPHOS 87 58  BILITOT 1.0 0.8  PROT 9.0* 6.3*  ALBUMIN 4.0 2.6*   No results for input(s): LIPASE, AMYLASE in the last 168 hours. No results for input(s): AMMONIA in the last 168 hours. Coagulation Profile: No  results for input(s): INR, PROTIME in the last 168 hours. Cardiac Enzymes: No results for input(s): CKTOTAL, CKMB, CKMBINDEX, TROPONINI in the last 168 hours. BNP (last 3 results) No results for input(s): PROBNP in the last 8760 hours. HbA1C: No results for input(s): HGBA1C in the last 72 hours. CBG: No results for input(s): GLUCAP in the last 168 hours. Lipid Profile: No results for input(s): CHOL, HDL, LDLCALC, TRIG, CHOLHDL, LDLDIRECT in the last 72 hours. Thyroid Function Tests: No results for input(s): TSH, T4TOTAL, FREET4, T3FREE, THYROIDAB in the last 72 hours.  Anemia Panel: No results for input(s): VITAMINB12, FOLATE, FERRITIN, TIBC, IRON, RETICCTPCT in the last 72 hours. Urine analysis:    Component Value Date/Time   COLORURINE YELLOW 10/05/2021 0631   APPEARANCEUR HAZY (A) 10/05/2021 0631   LABSPEC 1.028 10/05/2021 0631   PHURINE 5.0 10/05/2021 0631   GLUCOSEU NEGATIVE 10/05/2021 0631   HGBUR SMALL (A) 10/05/2021 0631   BILIRUBINUR NEGATIVE 10/05/2021 0631  KETONESUR 5 (A) 10/05/2021 0631   PROTEINUR NEGATIVE 10/05/2021 0631   UROBILINOGEN 0.2 11/19/2012 1138   NITRITE NEGATIVE 10/05/2021 0631   LEUKOCYTESUR NEGATIVE 10/05/2021 0631   Sepsis Labs: @LABRCNTIP (procalcitonin:4,lacticidven:4)  ) Recent Results (from the past 240 hour(s))  Culture, blood (routine x 2)     Status: None   Collection Time: 09/28/21  7:43 PM   Specimen: BLOOD RIGHT ARM  Result Value Ref Range Status   Specimen Description BLOOD RIGHT ARM  Final   Special Requests   Final    BOTTLES DRAWN AEROBIC AND ANAEROBIC Blood Culture results may not be optimal due to an inadequate volume of blood received in culture bottles   Culture   Final    NO GROWTH 5 DAYS Performed at [facility], [employer] [address] [place], [place] [address]    Report Status 10/03/2021 FINAL  Final  Resp Panel by RT-PCR (Flu A&B, Covid) Peripheral     Status: Abnormal   Collection Time: 09/28/21  8:31 PM    Specimen: Peripheral; Nasopharyngeal(NP) swabs in vial transport medium  Result Value Ref Range Status   SARS Coronavirus 2 by RT PCR POSITIVE (A) NEGATIVE Final    Comment: (NOTE) SARS-CoV-2 target nucleic acids are DETECTED.  The SARS-CoV-2 RNA is generally detectable in upper respiratory specimens during the acute phase of infection. Positive results are indicative of the presence of the identified virus, but do not rule out bacterial infection or co-infection with other pathogens not detected by the test. Clinical correlation with patient history and other diagnostic information is necessary to determine patient infection status. The expected result is Negative.  Fact Sheet for Patients: EntrepreneurPulse.com.au  Fact Sheet for Healthcare Providers: IncredibleEmployment.be  This test is not yet approved or cleared by the [place] FDA and  has been authorized for detection and/or diagnosis of SARS-CoV-2 by FDA under an Emergency Use Authorization (EUA).  This EUA will remain in effect (meaning this test can be used) for the duration of  the COVID-19 declaration under Section 564(b)(1) of the A ct, 21 U.S.C. section 360bbb-3(b)(1), unless the authorization is terminated or revoked sooner.     Influenza A by PCR NEGATIVE NEGATIVE Final   Influenza B by PCR NEGATIVE NEGATIVE Final    Comment: (NOTE) The Xpert Xpress SARS-CoV-2/FLU/RSV plus assay is intended as an aid in the diagnosis of influenza from Nasopharyngeal swab specimens and should not be used as a sole basis for treatment. Nasal washings and aspirates are unacceptable for Xpert Xpress SARS-CoV-2/FLU/RSV testing.  Fact Sheet for Patients: EntrepreneurPulse.com.au  Fact Sheet for Healthcare Providers: IncredibleEmployment.be  This test is not yet approved or cleared by the [place] FDA and has been authorized for detection and/or  diagnosis of SARS-CoV-2 by FDA under an Emergency Use Authorization (EUA). This EUA will remain in effect (meaning this test can be used) for the duration of the COVID-19 declaration under Section 564(b)(1) of the Act, 21 U.S.C. section 360bbb-3(b)(1), unless the authorization is terminated or revoked.  Performed at [facility], [place] [address], [place],  [address]   Culture, blood (routine x 2)     Status: None   Collection Time: 09/28/21  8:35 PM   Specimen: BLOOD RIGHT HAND  Result Value Ref Range Status   Specimen Description BLOOD RIGHT HAND  Final   Special Requests   Final    BOTTLES DRAWN AEROBIC AND ANAEROBIC Blood Culture results may not be optimal due to an inadequate volume of  blood received in culture bottles   Culture   Final    NO GROWTH 5 DAYS Performed at [facility], [place] [address], [place], [place] [address]    Report Status 10/03/2021 FINAL  Final  Culture, Respiratory w Gram Stain     Status: None (Preliminary result)   Collection Time: 10/06/21  2:03 PM   Specimen: Tracheal Aspirate  Result Value Ref Range Status   Specimen Description   Final    TRACHEAL ASPIRATE Performed at [facility] [address] [place], [place] [address]    Special Requests   Final    NONE Performed at [facility], [place] [address] [place], [place] [address]    Gram Stain   Final    FEW SQUAMOUS EPITHELIAL CELLS PRESENT ABUNDANT WBC PRESENT,BOTH PMN AND MONONUCLEAR ABUNDANT GRAM POSITIVE COCCI FEW GRAM POSITIVE RODS FEW GRAM NEGATIVE RODS    Culture   Final    ABUNDANT STAPHYLOCOCCUS AUREUS CULTURE REINCUBATED FOR BETTER GROWTH Performed at [facility], [place] [address] [place], [place] [address]    Report Status PENDING  Incomplete      Studies: DG Abd 1 View  Result Date: 10/07/2021 CLINICAL DATA:  NG placement. EXAM: ABDOMEN - 1 VIEW COMPARISON:  Abdominal radiograph dated 02/26/2021. FINDINGS:  Evaluation is limited due to overlying support wires. Enteric tube extends below the diaphragm into the distal stomach. The tube fold back on itself with tip in the proximal stomach. Air is noted within the colon. IMPRESSION: Enteric tube with tip in the proximal stomach. Electronically Signed   By: Anner Crete M.D.   On: 10/07/2021 01:09    Scheduled Meds:  aspirin  81 mg Oral Daily   free water  200 mL Per Tube Q4H   levothyroxine  100 mcg Oral Q0600   liver oil-zinc oxide   Topical TID    Continuous Infusions:  heparin 1,350 Units/hr (10/07/21 0330)   piperacillin-tazobactam (ZOSYN)  IV 3.375 g (10/07/21 0933)   remdesivir 100 mg in NS 100 mL 100 mg (10/07/21 0816)     LOS: 3 days     Kayleen Memos, MD [facility] Pager [phone]  If 7PM-7AM, please contact night-coverage [email or web address] Password [email or web address] 10/07/2021, 4:07 PM

## 2021-10-07 NOTE — Progress Notes (Signed)
OT Cancellation Note  Patient Details Name: Ian Weber MRN: [id number] DOB: 1934-04-14   Cancelled Treatment:    Reason Eval/Treat Not Completed: Patient not medically ready. Patient continues to have critical level of hypernatremia at 153. RN reports patient is obtunded and only responds to pain. Will hold and follow acutely and perform OT evaluation when patient more appropriate for therapy evaluation.  Kinaya Hilliker L Sharrie Self 10/07/2021, 8:48 AM

## 2021-10-07 NOTE — Consult Note (Addendum)
[facility] Nurse Consult Note: Reason for Consult: Consult requested for buttocks.  Performed remotely after review of progress notes.  Pt is noted to have 2 areas of Stage 2 pressure injuries to bilat buttocks, pink and dry.  This can be treated independently by the staff nurses using the skin care order set in [place].  Requested to provide additional topical treatment recommendations.  Pt is noted to have an allergy to an ingredient in Gerhardts butt cream, so I have provided topical treatment orders below. Pressure Injury POA: Yes, according to wound care flow-sheet Dressing procedure/placement/frequency: Topical treatment orders provided for bedside nurses to perform as follows to protect and promote healing: Apply Desitin cream and then apply antifungal powder to buttocks/perineum TID and PRN when turning and cleaning. Leave foam dressing off. Please re-consult if further assistance is needed.  Thank-you,  Julien Girt MSN, [place], [place], [place], [place]

## 2021-10-07 NOTE — Progress Notes (Signed)
°  D/with Dr Nevada Crane - she Is addressing Na. Will call ccm again if needed    SIGNATURE    Dr. Brand Males, M.D., F.C.C.P,  Pulmonary and Critical Care Medicine Staff Physician, [place] Director - Interstitial Lung Disease  Program  [facility] [place], [place], [address]  NPI Number:  NPI #[id number]  Pager: [phone], If no answer  -> Check AMION or Try [place] Telephone (clinical office): [phone] Telephone (research): [phone]  10:50 AM 10/07/2021    Recent Labs  Lab 10/05/21 0359 10/05/21 0836 10/05/21 1603 10/06/21 0729 10/06/21 2106 10/07/21 0428  NA 150*  --  151* 151* 153* 153*  K 3.2*  --  3.5 3.8 4.8 3.5  CL 115*  --  116* 120* 119* 121*  CO2 27  --  26 28 25 25   GLUCOSE 111*  --  118* 139* 95 119*  BUN 45*  --  36* 29* 26* 25*  CREATININE 0.77  --  0.76 0.95 1.00 0.98  CALCIUM 8.5*  --  8.4* 7.8* 8.3* 7.9*  MG  --  2.5*  --  2.5*  --  2.7*  PHOS  --   --   --  2.3*  --  2.7

## 2021-10-07 NOTE — Progress Notes (Signed)
[facility] for heparin Indication: acute pulmonary embolus  Allergies  Allergen Reactions   Amlodipine Other (See Comments)    Reaction not recalled by patient   Chlorthalidone Other (See Comments)    Reaction not recalled by patient   Codeine Phosphate Other (See Comments)    Weakness, sweating   Hydrochlorothiazide W-Triamterene Other (See Comments)    Reaction not recalled by patient   Prednisone Itching   Sulfamethoxazole Itching   Triamterene Other (See Comments)    Reaction not recalled by patient    Patient Measurements: Height: 6' (182.9 cm) Weight: 72.6 kg (160 lb) IBW/kg (Calculated) : 77.6 Heparin Dosing Weight: 73 kg  Vital Signs: Temp: 98.5 F (36.9 C) (12/30 0424) Temp Source: Axillary (12/30 0424) BP: 128/82 (12/30 0424) Pulse Rate: 80 (12/30 0424)  Labs: Recent Labs    10/04/21 1548 10/04/21 1548 10/04/21 1852 10/05/21 0359 10/05/21 1603 10/05/21 2110 10/06/21 0729 10/06/21 2106 10/07/21 0428  HGB 16.2  --   --   --   --  14.7 13.7  --  13.0  HCT 51.5  --   --   --   --  46.1 43.5  --  41.9  PLT 359  --   --   --   --  331 318  --  314  HEPARINUNFRC  --    < >  --   --   --  0.13* 0.53 0.10* 0.44  CREATININE 1.01  --   --    < > 0.76  --  0.95 1.00  --   TROPONINIHS 4,339*  --  2,885*  --   --   --   --   --   --    < > = values in this interval not displayed.     Estimated Creatinine Clearance: 55.5 mL/min (by C-G formula based on SCr of 1 mg/dL).   Assessment: Patient is an 87 y.o M who was recently diagnosed with COVID-19 presented to the ED on 10/04/21 with AMS. Troponin was found to be elevated, but cardiology does not think clinical picture is consistent with ACS.  D-dimer on 10/05/21 was found to be >20 and chest CTA came back positive for bilateral PE.  He's currently on heparin drip for VTE treatment.  Today, 10/07/2021: - heparin level 0.44 therapeutic on 1350 units/hr - Ddimer down  11.16 - cbc ok - no bleeding documented  Goal of Therapy:  Heparin level 0.3-0.7 units/ml Monitor platelets by anticoagulation protocol: Yes   Plan:  - continue heparin drip at 1350 units/hr - daily CBC and HL - monitor for s/sx bleeding - f/u LE doppler  Dolly Rias RPh 10/07/2021, 5:14 AM

## 2021-10-07 NOTE — Progress Notes (Signed)
PT Cancellation Note  Patient Details Name: Ian Weber MRN: [id number] DOB: [birth year]   Cancelled Treatment:    Reason Eval/Treat Not Completed: Medical issues which prohibited therapy;Patient's level of consciousness. Will check back as schedule permits to complete PT eval.   Galen Manila 10/07/2021, 9:19 AM

## 2021-10-07 NOTE — Progress Notes (Signed)
NT states pt is too restless to do EKG right now. MD notified. Orders to hold off.

## 2021-10-07 NOTE — Progress Notes (Signed)
HOSPITAL MEDICINE OVERNIGHT EVENT NOTE    Notified by nursing the patient is becoming increasingly agitated, preventing nursing ability to place NG tube.  Chart reviewed, daytime provider has ordered NG tube for management of hyponatremia and administration of tap water flushes.  Due to prolonged QT, ordering a trial dose of 0.5 mg of intravenous Ativan as well as two-point soft wrist restraints.  We will monitor closely.   Vernelle Emerald  MD [facility]

## 2021-10-07 NOTE — Progress Notes (Signed)
SLP Cancellation Note  Patient Details Name: Ian Weber MRN: [id number] DOB: 09-Jan-1934   Cancelled treatment:       Reason Eval/Treat Not Completed: Medical issues which prohibited therapy;Patient's level of consciousness- obtunded; requiring NT suctioning by RT to help with secretions.  Will follow.  Reagyn Facemire L. Tivis Ringer, [place] Office number [phone] Pager [phone]    Assunta Curtis 10/07/2021, 2:49 PM

## 2021-10-08 ENCOUNTER — Inpatient Hospital Stay (HOSPITAL_COMMUNITY): Payer: Medicare HMO

## 2021-10-08 DIAGNOSIS — R400 Somnolence: Secondary | ICD-10-CM | POA: Diagnosis not present

## 2021-10-08 LAB — CBC
HCT: 43.8 % (ref 39.0–52.0)
Hemoglobin: 14.1 g/dL (ref 13.0–17.0)
MCH: 30.3 pg (ref 26.0–34.0)
MCHC: 32.2 g/dL (ref 30.0–36.0)
MCV: 94 fL (ref 80.0–100.0)
Platelets: 259 10*3/uL (ref 150–400)
RBC: 4.66 MIL/uL (ref 4.22–5.81)
RDW: 13.2 % (ref 11.5–15.5)
WBC: 19.2 10*3/uL — ABNORMAL HIGH (ref 4.0–10.5)
nRBC: 0.2 % (ref 0.0–0.2)

## 2021-10-08 LAB — BLOOD GAS, VENOUS
Acid-Base Excess: 2.8 mmol/L — ABNORMAL HIGH (ref 0.0–2.0)
Bicarbonate: 27.8 mmol/L (ref 20.0–28.0)
O2 Saturation: 51 %
Patient temperature: 98.6
pCO2, Ven: 46.6 mmHg (ref 44.0–60.0)
pH, Ven: 7.392 (ref 7.250–7.430)
pO2, Ven: 29.6 mmHg — CL (ref 32.0–45.0)

## 2021-10-08 LAB — COMPREHENSIVE METABOLIC PANEL
ALT: 15 U/L (ref 0–44)
AST: 18 U/L (ref 15–41)
Albumin: 2.4 g/dL — ABNORMAL LOW (ref 3.5–5.0)
Alkaline Phosphatase: 56 U/L (ref 38–126)
Anion gap: 7 (ref 5–15)
BUN: 22 mg/dL (ref 8–23)
CO2: 25 mmol/L (ref 22–32)
Calcium: 7.7 mg/dL — ABNORMAL LOW (ref 8.9–10.3)
Chloride: 118 mmol/L — ABNORMAL HIGH (ref 98–111)
Creatinine, Ser: 0.98 mg/dL (ref 0.61–1.24)
GFR, Estimated: >60 mL/min (ref >60)
Glucose, Bld: 137 mg/dL — ABNORMAL HIGH (ref 70–99)
Potassium: 3.5 mmol/L (ref 3.5–5.1)
Sodium: 150 mmol/L — ABNORMAL HIGH (ref 135–145)
Total Bilirubin: 0.8 mg/dL (ref 0.3–1.2)
Total Protein: 6.1 g/dL — ABNORMAL LOW (ref 6.5–8.1)

## 2021-10-08 LAB — HEPARIN LEVEL (UNFRACTIONATED): Heparin Unfractionated: 0.43 IU/mL (ref 0.30–0.70)

## 2021-10-08 LAB — CBC WITH DIFFERENTIAL/PLATELET
Abs Immature Granulocytes: 0.2 10*3/uL — ABNORMAL HIGH (ref 0.00–0.07)
Basophils Absolute: 0 10*3/uL (ref 0.0–0.1)
Basophils Relative: 0 %
Eosinophils Absolute: 0 10*3/uL (ref 0.0–0.5)
Eosinophils Relative: 0 %
HCT: 41.2 % (ref 39.0–52.0)
Hemoglobin: 12.6 g/dL — ABNORMAL LOW (ref 13.0–17.0)
Immature Granulocytes: 1 %
Lymphocytes Relative: 6 %
Lymphs Abs: 1.2 10*3/uL (ref 0.7–4.0)
MCH: 29.6 pg (ref 26.0–34.0)
MCHC: 30.6 g/dL (ref 30.0–36.0)
MCV: 96.9 fL (ref 80.0–100.0)
Monocytes Absolute: 0.6 10*3/uL (ref 0.1–1.0)
Monocytes Relative: 3 %
Neutro Abs: 17.5 10*3/uL — ABNORMAL HIGH (ref 1.7–7.7)
Neutrophils Relative %: 90 %
Platelet Morphology: NORMAL
Platelets: 287 10*3/uL (ref 150–400)
RBC: 4.25 MIL/uL (ref 4.22–5.81)
RDW: 13.4 % (ref 11.5–15.5)
WBC: 19.6 10*3/uL — ABNORMAL HIGH (ref 4.0–10.5)
nRBC: 0.1 % (ref 0.0–0.2)

## 2021-10-08 LAB — SODIUM
Sodium: 148 mmol/L — ABNORMAL HIGH (ref 135–145)
Sodium: 150 mmol/L — ABNORMAL HIGH (ref 135–145)
Sodium: 150 mmol/L — ABNORMAL HIGH (ref 135–145)
Sodium: 151 mmol/L — ABNORMAL HIGH (ref 135–145)

## 2021-10-08 LAB — BASIC METABOLIC PANEL
Anion gap: 4 — ABNORMAL LOW (ref 5–15)
BUN: 22 mg/dL (ref 8–23)
CO2: 27 mmol/L (ref 22–32)
Calcium: 7.7 mg/dL — ABNORMAL LOW (ref 8.9–10.3)
Chloride: 116 mmol/L — ABNORMAL HIGH (ref 98–111)
Creatinine, Ser: 0.87 mg/dL (ref 0.61–1.24)
GFR, Estimated: >60 mL/min (ref >60)
Glucose, Bld: 121 mg/dL — ABNORMAL HIGH (ref 70–99)
Potassium: 3.7 mmol/L (ref 3.5–5.1)
Sodium: 147 mmol/L — ABNORMAL HIGH (ref 135–145)

## 2021-10-08 LAB — PROCALCITONIN: Procalcitonin: 0.57 ng/mL

## 2021-10-08 LAB — PHOSPHORUS: Phosphorus: 2.7 mg/dL (ref 2.5–4.6)

## 2021-10-08 LAB — MAGNESIUM: Magnesium: 2.3 mg/dL (ref 1.7–2.4)

## 2021-10-08 LAB — LACTIC ACID, PLASMA: Lactic Acid, Venous: 1.9 mmol/L (ref 0.5–1.9)

## 2021-10-08 MED ORDER — POTASSIUM CHLORIDE 10 MEQ/100ML IV SOLN
INTRAVENOUS | Status: AC
  Filled 2021-10-08: qty 100

## 2021-10-08 MED ORDER — POTASSIUM CL IN DEXTROSE 5% 20 MEQ/L IV SOLN
20.0000 meq | INTRAVENOUS | Status: DC
  Filled 2021-10-08: qty 1000

## 2021-10-08 MED ORDER — FREE WATER
200.0000 mL | Status: DC
Start: 2021-10-08 — End: 2021-10-09
  Administered 2021-10-08 – 2021-10-09 (×3): 200 mL

## 2021-10-08 MED ORDER — VANCOMYCIN HCL 1500 MG/300ML IV SOLN
1500.0000 mg | INTRAVENOUS | Status: DC
  Administered 2021-10-08 – 2021-10-09 (×2): 1500 mg via INTRAVENOUS
  Filled 2021-10-08 (×3): qty 300

## 2021-10-08 MED ORDER — IOHEXOL 350 MG/ML SOLN
75.0000 mL | Freq: Once | INTRAVENOUS | Status: AC | PRN
  Administered 2021-10-08: 60 mL via INTRAVENOUS

## 2021-10-08 NOTE — Progress Notes (Signed)
Physical Therapy Discharge Patient Details Name: Ian Weber MRN: [id number] DOB: 1934/06/14 Today's Date: 10/08/2021 Time:  -     Patient discharged from PT services secondary to medical decline - will need to re-order PT to resume therapy services.  Please see latest therapy progress note for current level of functioning and progress toward goals.    Progress and discharge plan discussed with patient and/or caregiver: unable.  GP     Claretha Cooper 10/08/2021, 6:48 AM  [place] Pager [phone] Office [phone]

## 2021-10-08 NOTE — Progress Notes (Signed)
Dayshift nurse from 10/07/21 told night nurse that patient was not able to do EKG due to being too restless. Passed off to night nurse to try EKG again. Patient was still too restless to get a 12-lead-EKG. Passed along to dayshift (10/08/21).

## 2021-10-08 NOTE — Plan of Care (Signed)
°  Problem: Education: Goal: Knowledge of risk factors and measures for prevention of condition will improve Outcome: Progressing   Problem: Pain Managment: Goal: General experience of comfort will improve Outcome: Progressing

## 2021-10-08 NOTE — Progress Notes (Signed)
[facility] for heparin Indication: acute pulmonary embolus  Allergies  Allergen Reactions   Amlodipine Other (See Comments)    Reaction not recalled by patient   Chlorthalidone Other (See Comments)    Reaction not recalled by patient   Codeine Phosphate Other (See Comments)    Weakness, sweating   Hydrochlorothiazide W-Triamterene Other (See Comments)    Reaction not recalled by patient   Prednisone Itching   Sulfamethoxazole Itching   Triamterene Other (See Comments)    Reaction not recalled by patient    Patient Measurements: Height: 6' (182.9 cm) Weight: 72.6 kg (160 lb) IBW/kg (Calculated) : 77.6 Heparin Dosing Weight: 73 kg  Vital Signs: Temp: 99.1 F (37.3 C) (12/31 0203) Temp Source: Axillary (12/31 0203) BP: 99/71 (12/31 0203) Pulse Rate: 85 (12/31 0203)  Labs: Recent Labs    10/06/21 0729 10/06/21 2106 10/07/21 0428 10/07/21 1750 10/08/21 0321  HGB 13.7  --  13.0  --  14.1  HCT 43.5  --  41.9  --  43.8  PLT 318  --  314  --  259  HEPARINUNFRC 0.53 0.10* 0.44  --  0.43  CREATININE 0.95 1.00 0.98 0.82  --      Estimated Creatinine Clearance: 67.6 mL/min (by C-G formula based on SCr of 0.82 mg/dL).   Assessment: Patient is an 87 y.o M who was recently diagnosed with COVID-19 presented to the ED on 10/04/21 with AMS. Troponin was found to be elevated, but cardiology does not think clinical picture is consistent with ACS.  D-dimer on 10/05/21 was found to be >20 and chest CTA came back positive for bilateral PE.  He's currently on heparin drip for VTE treatment.  Today, 10/08/2021: - heparin level 0.43 therapeutic on 1350 units/hr - cbc ok - no bleeding documented  Goal of Therapy:  Heparin level 0.3-0.7 units/ml Monitor platelets by anticoagulation protocol: Yes   Plan:  - continue heparin drip at 1350 units/hr - daily CBC and HL - monitor for s/sx bleeding - f/u LE doppler  Dolly Rias  RPh 10/08/2021, 4:22 AM

## 2021-10-08 NOTE — Progress Notes (Signed)
OT Cancellation Note  Patient Details Name: Ian Weber MRN: [id number] DOB: November 10, 1933   Cancelled Treatment:    Reason Eval/Treat Not Completed: Patient not medically ready. Continues to have critically elevated sodium and minimally responsive. Palliative has been consulted. Will continue to follow  Ian Weber 10/08/2021, 6:46 AM

## 2021-10-08 NOTE — Progress Notes (Signed)
RT NT suctioned patient with little results. RT was able to get patient to cough. RT obtained thick tan secretions. Patient tolerated well.

## 2021-10-08 NOTE — Progress Notes (Signed)
PROGRESS NOTE  WOODFORD STREGE NAT:[id number] DOB: 04/16/1934 DOA: 10/04/2021 PCP: Jilda Panda, MD  HPI/Recap of past 24 hours: Ian Weber is a 87 y.o. male with medical history significant for dementia, hypertension, hypothyroidism, history of GI bleed who presents from home with concerns of increasing altered mental status and generalized weakness.  Associated with poor appetite and poor oral intake.  Was brought to the ED for further evaluation.  Found to be COVID-19 positive with electrolytes abnormality.  Started on antiviral IV remdesivir.  Hospital course complicated by findings of B/L pulmonary embolism, started hep drip 10/05/21. Minimal responsiveness, restless agitation delirium reported early morning 12/30.  10/08/2021: Patient seen and examined at his bedside.  Minimally responsive despite improvement of serum sodium, pH and CO2 levels.  CT head nonacute.  CT abdomen pelvis nonacute.  Incidental findings of heterogeneous enhancing left adrenal mass measuring 3.3 x 2.8 cm.   Assessment/Plan: Principal Problem:   AMS (altered mental status) Active Problems:   Pressure injury of skin   COVID-19 virus infection   Elevated troponin   Dementia without behavioral disturbance (HCC)   HTN (hypertension)   Persistent acute metabolic encephalopathy, multifactorial secondary to COVID-19 viral infection and hypernatremia from poor oral intake, dehydration.  CT head nonacute, CT abdomen and pelvis with contrast nonacute. NG tube placement for hydration on 10/07/2021.  Continue free water flushing to avoid dehydration.  Improving hypovolemic hypernatremia in the setting of poor oral intake Continue free water flushes 200 cc every 2 hours Continue IV fluid hydration D5 KCl at 100 cc/h x 2 days.  Newly diagnosed bilateral acute pulmonary embolism Started on heparin drip on 10/05/2021, continue No evidence of right heart strain  Acute hypoxic respiratory failure secondary to  above Continue to maintain oxygen saturation greater than 92% Hypoxemia seen on ABG 10/06/2021  Elevated troponin in the setting of covid 19 infection and bilateral pulmonary embolism Trop S peaked at >4300 and down trended Continue to monitor on telemetry  Prolonged QTC QTC greater than 500 on 12 EKG done on 10/04/2021 Unable to obtain 12 EKG as the patient would not cooperate. Continue to optimize magnesium and potassium levels Continue to avoid QTC prolonging agents  Covid-19 viral infection, POA IV antiviral  Resolved post repletion: Hypokalemia K+ 3.2> 3.8 Continue to replete electrolytes as indicated.  Dementia Continue fall/aspiration/delirium precautions  Goals of care Palliative care team consulted to assist with establishing goals of care Ongoing discussion of goals of care respiratory treatment family.  DNR as of 10/07/2021  Pressure Injury 10/04/21 Buttocks Bilateral Stage 2 -  Partial thickness loss of dermis presenting as a shallow open injury with a red, pink wound bed without slough. (Active) 10/04/21 2300 Location: Buttocks Location Orientation: Bilateral Staging: Stage 2 -  Partial thickness loss of dermis presenting as a shallow open injury with a red, pink wound bed without slough. Wound Description (Comments): Present on Admission:Yes    Code Status: Full code   Family Communication: Updated his wife in person at bedside on 10/08/2021.  Disposition Plan: Poor prognosis.   Consultants: Palliative care team  Procedures: 2D echo   Antimicrobials: Antiviral Remdesivir   DVT prophylaxis:  Hep drip  Status is: Inpatient  Patient requires at least 2 midnights for further evaluation and treatment of present conditions.        Objective: Vitals:   10/08/21 0203 10/08/21 0630 10/08/21 1149 10/08/21 1203  BP: 99/71 109/83  101/62  Pulse: 85 78    Resp: 17  16    Temp: 99.1 F (37.3 C) 99 F (37.2 C) 98.3 F (36.8 C) (!) 100.5 F  (38.1 C)  TempSrc: Axillary Axillary Axillary Rectal  SpO2: 100% 96%    Weight:      Height:        Intake/Output Summary (Last 24 hours) at 10/08/2021 1551 Last data filed at 10/08/2021 0600 Gross per 24 hour  Intake 3671.57 ml  Output 350 ml  Net 3321.57 ml   Filed Weights   10/04/21 1538  Weight: 72.6 kg    Exam:  General: 87 y.o. year-old male frail-appearing.  Minimally responsive.   Cardiovascular: Regular rate and rhythm no rubs or gallops.   Respiratory: Mild rales at bases no wheezing noted.  Poor inspiratory effort.   Abdomen: Soft nontender normal bowel sounds present.   Musculoskeletal: No lower extremity edema bilaterally. Skin: No ulcerative lesions noted.   Psychiatry: Unable to assess mood due to obtundation. Neuro: Does not follow commands.   Data Reviewed: CBC: Recent Labs  Lab 10/04/21 1548 10/05/21 2110 10/06/21 0729 10/07/21 0428 10/08/21 0321 10/08/21 1219  WBC 10.3 12.0* 12.5* 14.3* 19.2* 19.6*  NEUTROABS 8.6*  --   --   --   --  17.5*  HGB 16.2 14.7 13.7 13.0 14.1 12.6*  HCT 51.5 46.1 43.5 41.9 43.8 41.2  MCV 94.7 94.7 94.2 95.4 94.0 96.9  PLT 359 331 318 314 259 941   Basic Metabolic Panel: Recent Labs  Lab 10/05/21 0836 10/05/21 1603 10/06/21 0729 10/06/21 2106 10/07/21 0428 10/07/21 1750 10/07/21 1922 10/07/21 2346 10/08/21 0321 10/08/21 0726 10/08/21 0958 10/08/21 1219  NA  --    < > 151* 153* 153* 155*   < > 150* 150* 150* 151* 148*  K  --    < > 3.8 4.8 3.5 3.1*  --   --  3.5  --   --   --   CL  --    < > 120* 119* 121* 122*  --   --  118*  --   --   --   CO2  --    < > 28 25 25 28   --   --  25  --   --   --   GLUCOSE  --    < > 139* 95 119* 113*  --   --  137*  --   --   --   BUN  --    < > 29* 26* 25* 23  --   --  22  --   --   --   CREATININE  --    < > 0.95 1.00 0.98 0.82  --   --  0.98  --   --   --   CALCIUM  --    < > 7.8* 8.3* 7.9* 7.9*  --   --  7.7*  --   --   --   MG 2.5*  --  2.5*  --  2.7*  --   --   --   2.3  --   --   --   PHOS  --   --  2.3*  --  2.7  --   --   --  2.7  --   --   --    < > = values in this interval not displayed.   GFR: Estimated Creatinine Clearance: 56.6 mL/min (by C-G formula based on SCr of 0.98 mg/dL). Liver Function Tests: Recent Labs  Lab 10/04/21 1548 10/06/21 0729 10/08/21 0321  AST 33 21 18  ALT 20 16 15   ALKPHOS 87 58 56  BILITOT 1.0 0.8 0.8  PROT 9.0* 6.3* 6.1*  ALBUMIN 4.0 2.6* 2.4*   No results for input(s): LIPASE, AMYLASE in the last 168 hours. No results for input(s): AMMONIA in the last 168 hours. Coagulation Profile: No results for input(s): INR, PROTIME in the last 168 hours. Cardiac Enzymes: No results for input(s): CKTOTAL, CKMB, CKMBINDEX, TROPONINI in the last 168 hours. BNP (last 3 results) No results for input(s): PROBNP in the last 8760 hours. HbA1C: No results for input(s): HGBA1C in the last 72 hours. CBG: No results for input(s): GLUCAP in the last 168 hours. Lipid Profile: No results for input(s): CHOL, HDL, LDLCALC, TRIG, CHOLHDL, LDLDIRECT in the last 72 hours. Thyroid Function Tests: No results for input(s): TSH, T4TOTAL, FREET4, T3FREE, THYROIDAB in the last 72 hours.  Anemia Panel: No results for input(s): VITAMINB12, FOLATE, FERRITIN, TIBC, IRON, RETICCTPCT in the last 72 hours. Urine analysis:    Component Value Date/Time   COLORURINE YELLOW 10/05/2021 0631   APPEARANCEUR HAZY (A) 10/05/2021 0631   LABSPEC 1.028 10/05/2021 0631   PHURINE 5.0 10/05/2021 0631   GLUCOSEU NEGATIVE 10/05/2021 0631   HGBUR SMALL (A) 10/05/2021 0631   BILIRUBINUR NEGATIVE 10/05/2021 0631   KETONESUR 5 (A) 10/05/2021 0631   PROTEINUR NEGATIVE 10/05/2021 0631   UROBILINOGEN 0.2 11/19/2012 1138   NITRITE NEGATIVE 10/05/2021 0631   LEUKOCYTESUR NEGATIVE 10/05/2021 0631   Sepsis Labs: @LABRCNTIP (procalcitonin:4,lacticidven:4)  ) Recent Results (from the past 240 hour(s))  Culture, blood (routine x 2)     Status: None    Collection Time: 09/28/21  7:43 PM   Specimen: BLOOD RIGHT ARM  Result Value Ref Range Status   Specimen Description BLOOD RIGHT ARM  Final   Special Requests   Final    BOTTLES DRAWN AEROBIC AND ANAEROBIC Blood Culture results may not be optimal due to an inadequate volume of blood received in culture bottles   Culture   Final    NO GROWTH 5 DAYS Performed at [facility], [place] [address] [place], [place] [address]    Report Status 10/03/2021 FINAL  Final  Resp Panel by RT-PCR (Flu A&B, Covid) Peripheral     Status: Abnormal   Collection Time: 09/28/21  8:31 PM   Specimen: Peripheral; Nasopharyngeal(NP) swabs in vial transport medium  Result Value Ref Range Status   SARS Coronavirus 2 by RT PCR POSITIVE (A) NEGATIVE Final    Comment: (NOTE) SARS-CoV-2 target nucleic acids are DETECTED.  The SARS-CoV-2 RNA is generally detectable in upper respiratory specimens during the acute phase of infection. Positive results are indicative of the presence of the identified virus, but do not rule out bacterial infection or co-infection with other pathogens not detected by the test. Clinical correlation with patient history and other diagnostic information is necessary to determine patient infection status. The expected result is Negative.  Fact Sheet for Patients: EntrepreneurPulse.com.au  Fact Sheet for Healthcare Providers: IncredibleEmployment.be  This test is not yet approved or cleared by the [place] FDA and  has been authorized for detection and/or diagnosis of SARS-CoV-2 by FDA under an Emergency Use Authorization (EUA).  This EUA will remain in effect (meaning this test can be used) for the duration of  the COVID-19 declaration under Section 564(b)(1) of the A ct, 21 U.S.C. section 360bbb-3(b)(1), unless the authorization is terminated or revoked sooner.  Influenza A by PCR NEGATIVE NEGATIVE Final   Influenza B by PCR NEGATIVE  NEGATIVE Final    Comment: (NOTE) The Xpert Xpress SARS-CoV-2/FLU/RSV plus assay is intended as an aid in the diagnosis of influenza from Nasopharyngeal swab specimens and should not be used as a sole basis for treatment. Nasal washings and aspirates are unacceptable for Xpert Xpress SARS-CoV-2/FLU/RSV testing.  Fact Sheet for Patients: EntrepreneurPulse.com.au  Fact Sheet for Healthcare Providers: IncredibleEmployment.be  This test is not yet approved or cleared by the [place] FDA and has been authorized for detection and/or diagnosis of SARS-CoV-2 by FDA under an Emergency Use Authorization (EUA). This EUA will remain in effect (meaning this test can be used) for the duration of the COVID-19 declaration under Section 564(b)(1) of the Act, 21 U.S.C. section 360bbb-3(b)(1), unless the authorization is terminated or revoked.  Performed at [facility], [place] [address] [place], [place] [address]   Culture, blood (routine x 2)     Status: None   Collection Time: 09/28/21  8:35 PM   Specimen: BLOOD RIGHT HAND  Result Value Ref Range Status   Specimen Description BLOOD RIGHT HAND  Final   Special Requests   Final    BOTTLES DRAWN AEROBIC AND ANAEROBIC Blood Culture results may not be optimal due to an inadequate volume of blood received in culture bottles   Culture   Final    NO GROWTH 5 DAYS Performed at [facility], [place] [address], [place], [place] [address]    Report Status 10/03/2021 FINAL  Final  Culture, Respiratory w Gram Stain     Status: None (Preliminary result)   Collection Time: 10/06/21  2:03 PM   Specimen: Tracheal Aspirate  Result Value Ref Range Status   Specimen Description   Final    TRACHEAL ASPIRATE Performed at [place] [address] [place], [place] [address]    Special Requests   Final    NONE Performed at [facility], [place] [address] [place],  [place] [address]    Gram Stain   Final    FEW SQUAMOUS EPITHELIAL CELLS PRESENT ABUNDANT WBC PRESENT,BOTH PMN AND MONONUCLEAR ABUNDANT GRAM POSITIVE COCCI FEW GRAM POSITIVE RODS FEW GRAM NEGATIVE RODS    Culture   Final    ABUNDANT STAPHYLOCOCCUS AUREUS MODERATE STREPTOCOCCUS PNEUMONIAE SUSCEPTIBILITIES TO FOLLOW Performed at [facility], [place] [address], [place],  [address]    Report Status PENDING  Incomplete      Studies: CT HEAD WO CONTRAST (5MM)  Result Date: 10/08/2021 CLINICAL DATA:  Delirium.  Sepsis.  COVID positive. EXAM: CT HEAD WITHOUT CONTRAST TECHNIQUE: Contiguous axial images were obtained from the base of the skull through the vertex without intravenous contrast. COMPARISON:  CT head without contrast 10/04/2021 FINDINGS: Brain: Moderate atrophy and white matter changes are stable. No acute infarct, hemorrhage, or mass lesion is present. Remote lacunar infarcts are present in the thalami bilaterally. White matter changes extend into the brainstem. Cerebellum is unremarkable. The ventricles are proportionate to the degree of atrophy. No significant extraaxial fluid collection is present. Vascular: Atherosclerotic changes are present within the cavernous internal carotid arteries. No hyperdense vessel is present. Skull: Calvarium is intact. No focal lytic or blastic lesions are present. No significant extracranial soft tissue lesion is present. Sinuses/Orbits: The paranasal sinuses and mastoid air cells are clear. NG tube enters on the right. Bilateral lens replacements are noted. Globes and orbits are otherwise unremarkable. IMPRESSION: 1.  Stable atrophy and white matter disease. This likely reflects the sequela of chronic microvascular ischemia. 2. No acute intracranial abnormality or significant interval change. Electronically Signed   By: San Morelle M.D.   On: 10/08/2021 14:04   CT CHEST ABDOMEN PELVIS W CONTRAST  Result Date: 10/08/2021 CLINICAL DATA:   87 year old male with history of sepsis. COVID positive patient. EXAM: CT CHEST, ABDOMEN, AND PELVIS WITH CONTRAST TECHNIQUE: Multidetector CT imaging of the chest, abdomen and pelvis was performed following the standard protocol during bolus administration of intravenous contrast. CONTRAST:  68mL OMNIPAQUE IOHEXOL 350 MG/ML SOLN COMPARISON:  Chest CT 10/05/2021. FINDINGS: CT CHEST FINDINGS Cardiovascular: Heart size is normal. There is no significant pericardial fluid, thickening or pericardial calcification. There is aortic atherosclerosis, as well as atherosclerosis of the great vessels of the mediastinum and the coronary arteries, including calcified atherosclerotic plaque in the left anterior descending and right coronary arteries. Mediastinum/Nodes: No pathologically enlarged mediastinal or hilar lymph nodes. Nasogastric tube extending into the stomach. Esophagus is otherwise unremarkable in appearance. Postoperative changes of thyroidectomy. No axillary lymphadenopathy. Lungs/Pleura: There is a combination of atelectasis and airspace consolidation in the left lower lobe. Some dependent subsegmental atelectasis is also noted in the right lower lobe. Trace left pleural fluid. No right pleural effusion. No definite suspicious appearing pulmonary nodules or masses are noted. Musculoskeletal: There are no aggressive appearing lytic or blastic lesions noted in the visualized portions of the skeleton. CT ABDOMEN PELVIS FINDINGS Hepatobiliary: No suspicious cystic or solid hepatic lesions. No intra or extrahepatic biliary ductal dilatation. Gallbladder is normal in appearance. Pancreas: No pancreatic mass. No pancreatic ductal dilatation. No pancreatic or peripancreatic fluid collections or inflammatory changes. Spleen: Unremarkable. Adrenals/Urinary Tract: Low-attenuation lesions in the lower pole of the right kidney compatible with simple cysts. The largest of these is exophytic measuring 6.7 x 6.0 cm. Left kidney  and right adrenal gland are normal in appearance. Heterogeneously enhancing mass in the left adrenal gland measuring 3.3 x 2.8 cm. No hydroureteronephrosis. In the right-side of the urinary bladder (axial image 111 of series 3) there is a 6 x 2 mm (mean diameter of 4 mm) calculus. Urinary bladder is otherwise unremarkable in appearance. Stomach/Bowel: Nasogastric tube coiled upon itself in the stomach, with the tip in the proximal stomach. Stomach is otherwise unremarkable in appearance. No pathologic dilatation of small bowel or colon. A few scattered colonic diverticulae are noted, particularly in the proximal sigmoid colon, without surrounding inflammatory changes to suggest an acute diverticulitis at this time. Normal appendix. Vascular/Lymphatic: Aortic atherosclerosis. No lymphadenopathy noted in the abdomen or pelvis. Reproductive: Prostate gland and seminal vesicles are unremarkable in appearance. Other: Small right inguinal hernia containing a short segment of mid to distal small bowel. No significant volume of ascites. No pneumoperitoneum. Musculoskeletal: Superior endplate compression fracture of L2 with approximately 30% loss of central vertebral body height. There are no aggressive appearing lytic or blastic lesions noted in the visualized portions of the skeleton. IMPRESSION: 1. Left lower lobe pneumonia. 2. No acute findings are noted in the abdomen or pelvis. 3. Heterogeneously enhancing left adrenal mass measuring 3.3 x 2.8 cm. This is incompletely characterized and could represent a benign or malignant lesion. Further evaluation with adrenal protocol CT scan should be considered. 4. Colonic diverticulosis without evidence of acute diverticulitis at this time. 5. Aortic atherosclerosis, in addition to 2 vessel coronary artery disease. 6. Additional incidental findings, as above. Electronically Signed   By: Vinnie Langton M.D.   On:  10/08/2021 14:40    Scheduled Meds:  aspirin  81 mg Oral Daily    free water  200 mL Per Tube Q2H   levothyroxine  100 mcg Oral Q0600   liver oil-zinc oxide   Topical TID    Continuous Infusions:  dextrose 5 % with KCl 20 mEq / L 100 mL/hr at 10/08/21 0509   heparin 1,350 Units/hr (10/08/21 0023)   vancomycin 1,500 mg (10/08/21 1507)     LOS: 4 days     Kayleen Memos, MD [facility] Pager [phone]  If 7PM-7AM, please contact night-coverage [email or web address] Password [facility] 10/08/2021, 3:51 PM

## 2021-10-08 NOTE — Progress Notes (Signed)
°   10/07/21 2132  Assess: MEWS Score  Temp (!) 104.1 F (40.1 C)  BP (!) 147/92  Pulse Rate 100  ECG Heart Rate 100  Resp 20  Level of Consciousness Responds to Pain  SpO2 95 %  O2 Device Room Air  Assess: MEWS Score  MEWS Temp 2  MEWS Systolic 0  MEWS Pulse 0  MEWS RR 0  MEWS LOC 2  MEWS Score 4  MEWS Score Color Red  Assess: if the MEWS score is Yellow or Red  Were vital signs taken at a resting state? Yes  Focused Assessment No change from prior assessment  Does the patient meet 2 or more of the SIRS criteria? Yes  Does the patient have a confirmed or suspected source of infection? No  Provider and Rapid Response Notified? Yes  MEWS guidelines implemented *See Row Information* Yes  Treat  MEWS Interventions Administered prn meds/treatments (received an order for Tylenol suppository)  Take Vital Signs  Increase Vital Sign Frequency  Red: Q 1hr X 4 then Q 4hr X 4, if remains red, continue Q 4hrs  Escalate  MEWS: Escalate Red: discuss with charge nurse/RN and provider, consider discussing with RRT  Notify: Charge Nurse/RN  Name of Charge Nurse/RN Notified Kristine, RN  Date Charge Nurse/RN Notified 10/07/21  Time Charge Nurse/RN Notified 2141  Notify: Provider  Provider Name/Title Jeannette Corpus (on call provider)  Date Provider Notified 10/07/21  Time Provider Notified 2142  Notification Type Page  Notification Reason Change in status (patient's temperature elevated)  Provider response See new orders  Date of Provider Response 10/07/21  Time of Provider Response 2148  Notify: Rapid Response  Name of Rapid Response RN Notified Genell, RN (Rapid Response Nurse)  Date Rapid Response Notified 10/07/21  Time Rapid Response Notified 2155  Document  Patient Outcome Other (Comment) (Patient's fever going down)  Progress note created (see row info) Yes  Assess: SIRS CRITERIA  SIRS Temperature  1  SIRS Pulse 1  SIRS Respirations  0  SIRS WBC 0  SIRS Score Sum  2

## 2021-10-08 NOTE — Progress Notes (Signed)
Pharmacy Antibiotic Note  Ian Weber is a 87 y.o. male Patient is an 87 y.o M who was recently diagnosed with COVID-19  presented to the ED on 10/04/21 with AMS. Pharmacy has been consulted to dose zosyn for aspiration PNA.  12/31 transitioning to vancomycin with tracheal aspirate growing S aureus.   Plan: Vancomycin 1500 mg IV Q 24 hrs. Goal AUC 400-550. Expected AUC: 525 SCr used: 0.98  Will f/u renal function, culture results, and clinical course Levels if/when indicated ____________________________________________ Height: 6' (182.9 cm) Weight: 72.6 kg (160 lb) IBW/kg (Calculated) : 77.6  Temp (24hrs), Avg:100.5 F (38.1 C), Min:98.3 F (36.8 C), Max:104.1 F (40.1 C)  Recent Labs  Lab 10/04/21 1548 10/04/21 1641 10/04/21 2020 10/05/21 0359 10/05/21 2110 10/06/21 0729 10/06/21 1007 10/06/21 2106 10/07/21 0428 10/07/21 1750 10/08/21 0321  WBC 10.3  --   --   --  12.0* 12.5*  --   --  14.3*  --  19.2*  CREATININE 1.01  --   --    < >  --  0.95  --  1.00 0.98 0.82 0.98  LATICACIDVEN  --  2.0* 2.1*  --   --   --  1.7  --   --   --   --    < > = values in this interval not displayed.     Estimated Creatinine Clearance: 56.6 mL/min (by C-G formula based on SCr of 0.98 mg/dL).    Allergies  Allergen Reactions   Amlodipine Other (See Comments)    Reaction not recalled by patient   Chlorthalidone Other (See Comments)    Reaction not recalled by patient   Codeine Phosphate Other (See Comments)    Weakness, sweating   Hydrochlorothiazide W-Triamterene Other (See Comments)    Reaction not recalled by patient   Prednisone Itching   Sulfamethoxazole Itching   Triamterene Other (See Comments)    Reaction not recalled by patient    Thank you for allowing pharmacy to be a part of this patients care.  Ulice Dash D 10/08/2021 12:14 PM

## 2021-10-08 NOTE — Plan of Care (Signed)
°  Problem: Respiratory: Goal: Will maintain a patent airway Outcome: Progressing   Problem: Coping: Goal: Level of anxiety will decrease Outcome: Progressing   Problem: Elimination: Goal: Will not experience complications related to urinary retention Outcome: Progressing   Problem: Pain Managment: Goal: General experience of comfort will improve Outcome: Progressing   Problem: Safety: Goal: Ability to remain free from injury will improve Outcome: Progressing   Problem: Skin Integrity: Goal: Risk for impaired skin integrity will decrease Outcome: Progressing   

## 2021-10-09 ENCOUNTER — Inpatient Hospital Stay (HOSPITAL_COMMUNITY): Payer: Medicare HMO

## 2021-10-09 DIAGNOSIS — R400 Somnolence: Secondary | ICD-10-CM | POA: Diagnosis not present

## 2021-10-09 DIAGNOSIS — U071 COVID-19: Secondary | ICD-10-CM | POA: Diagnosis not present

## 2021-10-09 DIAGNOSIS — F0390 Unspecified dementia without behavioral disturbance: Secondary | ICD-10-CM | POA: Diagnosis not present

## 2021-10-09 DIAGNOSIS — Z7189 Other specified counseling: Secondary | ICD-10-CM | POA: Diagnosis not present

## 2021-10-09 DIAGNOSIS — G9340 Encephalopathy, unspecified: Secondary | ICD-10-CM | POA: Diagnosis not present

## 2021-10-09 LAB — CBC WITH DIFFERENTIAL/PLATELET
Abs Immature Granulocytes: 0.14 10*3/uL — ABNORMAL HIGH (ref 0.00–0.07)
Basophils Absolute: 0 10*3/uL (ref 0.0–0.1)
Basophils Relative: 0 %
Eosinophils Absolute: 0.1 10*3/uL (ref 0.0–0.5)
Eosinophils Relative: 1 %
HCT: 38.5 % — ABNORMAL LOW (ref 39.0–52.0)
Hemoglobin: 12 g/dL — ABNORMAL LOW (ref 13.0–17.0)
Immature Granulocytes: 1 %
Lymphocytes Relative: 7 %
Lymphs Abs: 1.1 10*3/uL (ref 0.7–4.0)
MCH: 29.9 pg (ref 26.0–34.0)
MCHC: 31.2 g/dL (ref 30.0–36.0)
MCV: 95.8 fL (ref 80.0–100.0)
Monocytes Absolute: 0.6 10*3/uL (ref 0.1–1.0)
Monocytes Relative: 4 %
Neutro Abs: 15.5 10*3/uL — ABNORMAL HIGH (ref 1.7–7.7)
Neutrophils Relative %: 87 %
Platelets: 292 10*3/uL (ref 150–400)
RBC: 4.02 MIL/uL — ABNORMAL LOW (ref 4.22–5.81)
RDW: 13.4 % (ref 11.5–15.5)
WBC: 17.4 10*3/uL — ABNORMAL HIGH (ref 4.0–10.5)
nRBC: 0 % (ref 0.0–0.2)

## 2021-10-09 LAB — COMPREHENSIVE METABOLIC PANEL
ALT: 15 U/L (ref 0–44)
AST: 19 U/L (ref 15–41)
Albumin: 2.3 g/dL — ABNORMAL LOW (ref 3.5–5.0)
Alkaline Phosphatase: 58 U/L (ref 38–126)
Anion gap: 4 — ABNORMAL LOW (ref 5–15)
BUN: 19 mg/dL (ref 8–23)
CO2: 24 mmol/L (ref 22–32)
Calcium: 7.5 mg/dL — ABNORMAL LOW (ref 8.9–10.3)
Chloride: 115 mmol/L — ABNORMAL HIGH (ref 98–111)
Creatinine, Ser: 0.73 mg/dL (ref 0.61–1.24)
GFR, Estimated: >60 mL/min (ref >60)
Glucose, Bld: 89 mg/dL (ref 70–99)
Potassium: 3.7 mmol/L (ref 3.5–5.1)
Sodium: 143 mmol/L (ref 135–145)
Total Bilirubin: 1 mg/dL (ref 0.3–1.2)
Total Protein: 5.6 g/dL — ABNORMAL LOW (ref 6.5–8.1)

## 2021-10-09 LAB — CULTURE, RESPIRATORY W GRAM STAIN

## 2021-10-09 LAB — MAGNESIUM
Magnesium: 2.3 mg/dL (ref 1.7–2.4)
Magnesium: 2.4 mg/dL (ref 1.7–2.4)

## 2021-10-09 LAB — BASIC METABOLIC PANEL
Anion gap: 6 (ref 5–15)
Anion gap: 5 (ref 5–15)
BUN: 18 mg/dL (ref 8–23)
BUN: 18 mg/dL (ref 8–23)
CO2: 25 mmol/L (ref 22–32)
CO2: 24 mmol/L (ref 22–32)
Calcium: 7.6 mg/dL — ABNORMAL LOW (ref 8.9–10.3)
Calcium: 7.6 mg/dL — ABNORMAL LOW (ref 8.9–10.3)
Chloride: 113 mmol/L — ABNORMAL HIGH (ref 98–111)
Chloride: 113 mmol/L — ABNORMAL HIGH (ref 98–111)
Creatinine, Ser: 0.73 mg/dL (ref 0.61–1.24)
Creatinine, Ser: 0.68 mg/dL (ref 0.61–1.24)
GFR, Estimated: >60 mL/min (ref >60)
GFR, Estimated: >60 mL/min (ref >60)
Glucose, Bld: 117 mg/dL — ABNORMAL HIGH (ref 70–99)
Glucose, Bld: 107 mg/dL — ABNORMAL HIGH (ref 70–99)
Potassium: 3.4 mmol/L — ABNORMAL LOW (ref 3.5–5.1)
Potassium: 3.5 mmol/L (ref 3.5–5.1)
Sodium: 144 mmol/L (ref 135–145)
Sodium: 142 mmol/L (ref 135–145)

## 2021-10-09 LAB — VITAMIN B12: Vitamin B-12: 504 pg/mL (ref 180–914)

## 2021-10-09 LAB — C-REACTIVE PROTEIN: CRP: 21.3 mg/dL — ABNORMAL HIGH (ref <1.0)

## 2021-10-09 LAB — CBC
HCT: 39 % (ref 39.0–52.0)
Hemoglobin: 12.5 g/dL — ABNORMAL LOW (ref 13.0–17.0)
MCH: 30.3 pg (ref 26.0–34.0)
MCHC: 32.1 g/dL (ref 30.0–36.0)
MCV: 94.7 fL (ref 80.0–100.0)
Platelets: 298 10*3/uL (ref 150–400)
RBC: 4.12 MIL/uL — ABNORMAL LOW (ref 4.22–5.81)
RDW: 13.2 % (ref 11.5–15.5)
WBC: 21.2 10*3/uL — ABNORMAL HIGH (ref 4.0–10.5)
nRBC: 0.2 % (ref 0.0–0.2)

## 2021-10-09 LAB — TSH: TSH: 2.714 u[IU]/mL (ref 0.350–4.500)

## 2021-10-09 LAB — PHOSPHORUS
Phosphorus: 2.5 mg/dL (ref 2.5–4.6)
Phosphorus: 3 mg/dL (ref 2.5–4.6)

## 2021-10-09 LAB — LACTIC ACID, PLASMA: Lactic Acid, Venous: 1.2 mmol/L (ref 0.5–1.9)

## 2021-10-09 LAB — AMMONIA: Ammonia: 18 umol/L (ref 9–35)

## 2021-10-09 LAB — D-DIMER, QUANTITATIVE: D-Dimer, Quant: 2.07 ug/mL-FEU — ABNORMAL HIGH (ref 0.00–0.50)

## 2021-10-09 LAB — PROCALCITONIN
Procalcitonin: 0.46 ng/mL
Procalcitonin: 0.39 ng/mL

## 2021-10-09 LAB — HEPARIN LEVEL (UNFRACTIONATED)
Heparin Unfractionated: 0.25 IU/mL — ABNORMAL LOW (ref 0.30–0.70)
Heparin Unfractionated: 0.61 IU/mL (ref 0.30–0.70)

## 2021-10-09 LAB — TROPONIN I (HIGH SENSITIVITY): Troponin I (High Sensitivity): 45 ng/L — ABNORMAL HIGH (ref <18)

## 2021-10-09 MED ORDER — ENOXAPARIN SODIUM 80 MG/0.8ML IJ SOSY
70.0000 mg | PREFILLED_SYRINGE | Freq: Two times a day (BID) | INTRAMUSCULAR | Status: DC
  Administered 2021-10-09 – 2021-10-10 (×2): 70 mg via SUBCUTANEOUS
  Filled 2021-10-09 (×2): qty 0.8

## 2021-10-09 MED ORDER — FREE WATER
200.0000 mL | Freq: Four times a day (QID) | Status: DC
  Administered 2021-10-09 – 2021-10-10 (×5): 200 mL

## 2021-10-09 MED ORDER — POTASSIUM CHLORIDE 10 MEQ/100ML IV SOLN
10.0000 meq | INTRAVENOUS | Status: AC
  Administered 2021-10-09 (×3): 10 meq via INTRAVENOUS
  Filled 2021-10-09 (×3): qty 100

## 2021-10-09 MED ORDER — OSMOLITE 1.2 CAL PO LIQD
1000.0000 mL | ORAL | Status: DC
  Administered 2021-10-09: 1000 mL
  Filled 2021-10-09: qty 1000

## 2021-10-09 MED ORDER — GLYCOPYRROLATE 0.2 MG/ML IJ SOLN: 0.1000 mg | Freq: Once | INTRAMUSCULAR | Status: DC

## 2021-10-09 MED ORDER — POTASSIUM CL IN DEXTROSE 5% 20 MEQ/L IV SOLN
20.0000 meq | INTRAVENOUS | Status: DC
  Administered 2021-10-09: 20 meq via INTRAVENOUS
  Filled 2021-10-09: qty 1000

## 2021-10-09 MED ORDER — GLYCOPYRROLATE 0.2 MG/ML IJ SOLN
0.1000 mg | INTRAMUSCULAR | Status: AC
  Administered 2021-10-09: 0.1 mg via INTRAVENOUS
  Filled 2021-10-09: qty 1

## 2021-10-09 MED ORDER — GLYCOPYRROLATE 0.2 MG/ML IJ SOLN
0.1000 mg | Freq: Once | INTRAMUSCULAR | Status: DC
Start: 2021-10-09 — End: 2021-10-09

## 2021-10-09 NOTE — Progress Notes (Signed)
RT NOTES: NT Suctioned patient for copious thick yellow/tan/bloody secretions. Pt is still very rhonchus. MD notified.

## 2021-10-09 NOTE — Progress Notes (Addendum)
PROGRESS NOTE  Ian Weber IHK:[id number] DOB: [birth year] DOA: 10/04/2021 PCP: Jilda Panda, MD  HPI/Recap of past 24 hours: Ian Weber is a 88 y.o. male with medical history significant for dementia, hypertension, hypothyroidism, history of GI bleed who presents from home with concerns of increasing altered mental status and generalized weakness.  Associated with poor appetite and poor oral intake.  Was brought to the ED for further evaluation.  Recent COVID-19 positive 09/28/21.  Started on antiviral IV remdesivir.  Hospital course complicated by findings of B/L pulmonary embolism, started hep drip 10/05/21. Minimal responsiveness, restless agitation delirium reported early morning 12/30.  Heparin drip switched to full dose subcu Lovenox on 10/09/2021.  10/09/2021: Patient was seen and examined at bedside.  Minimally responsive.  Sputum culture returned positive for staph aureus and Streptococcus pneumoniae sensitive to IV vancomycin, resistant to erythromycin.    Updated his wife at bedside.  No oral intake for several days.  She wants to hold off on feeding tube.  Poor prognosis, palliative care team consulted and following.    Assessment/Plan: Principal Problem:   AMS (altered mental status) Active Problems:   Pressure injury of skin   COVID-19 virus infection   Elevated troponin   Dementia without behavioral disturbance (HCC)   HTN (hypertension)   Persistent acute metabolic encephalopathy, multifactorial secondary to COVID-19 viral infection and hypernatremia from poor oral intake, dehydration.  Covid 19+ 09/28/21 CT head nonacute, CT abdomen and pelvis with contrast nonacute. NG tube placement for hydration on 10/07/2021.  Continue free water flushing to avoid dehydration. No oral intake for several days, updated his wife at bedside.  She declines tube feeding on 10/09/2021.  After discussing with PCCM, family agreed to tube feeding, started 10/09/21 osmolite 1.2 cal trickle  feeds.  Dietitian consulted. Ammonia, B12, TSH, MRI brain, Trop, blood cx x 2 peripherally, pending.   COVID-19 viral infection with Staph aureus and Streptococcus pneumonia coinfection, POA Sputum culture obtained on 10/06/2021, returned positive for staph aureus and Streptococcus pneumoniae sensitive to IV vancomycin, resistant to erythromycin.   Continue IV vancomycin  Newly diagnosed left lower lobe Staph aureus and Streptococcus community-acquired pneumonia, POA Management as stated above Monitor fever curve and WBC  Newly diagnosed bilateral acute pulmonary embolism Started on heparin drip on 10/05/2021, switched to subcu Lovenox on 10/09/2021. No evidence of right heart strain on CT scan.  Left pleural effusion in setting of pneumonia Hold off IV fluid hydration to avoid worsening of left pleural effusion Continue IV antibiotics  Resolved hypovolemic hypernatremia in the setting of poor oral intake Free water flushes 200 cc every 6 hours. Hold off IV fluid hydration to avoid worsening left pleural effusion.  Acute hypoxic respiratory failure secondary to above Continue to maintain oxygen saturation greater than 92% Hypoxemia seen on ABG 10/06/2021  Elevated troponin in the setting of covid 19 infection and bilateral pulmonary embolism Trop S peaked at >4300 and down trended Continue to monitor on telemetry  Prolonged QTC QTC greater than 500 on 12 EKG done on 10/04/2021 Unable to obtain 12 EKG as the patient would not cooperate. Continue to optimize magnesium and potassium levels Continue to avoid QTC prolonging agents  Covid-19 viral infection, POA IV antiviral  Resolved post repletion: Hypokalemia K+ 3.2> 3.8 Continue to replete electrolytes as indicated.  Dementia Continue fall/aspiration/delirium precautions  Goals of care Palliative care team consulted to assist with establishing goals of care Ongoing discussion of goals of care respiratory treatment family.   DNR  as of 10/07/2021  Pressure Injury 10/04/21 Buttocks Bilateral Stage 2 -  Partial thickness loss of dermis presenting as a shallow open injury with a red, pink wound bed without slough. (Active) 10/04/21 2300 Location: Buttocks Location Orientation: Bilateral Staging: Stage 2 -  Partial thickness loss of dermis presenting as a shallow open injury with a red, pink wound bed without slough. Wound Description (Comments): Present on Admission:Yes  Critical care time: 65 minutes.    Code Status: Full code   Family Communication: Updated his wife at bedside, had a lengthy conversation about his present condition.  Also spoke with her daughter via phone.  Disposition Plan: Poor prognosis.   Consultants: Palliative care team  Procedures: 2D echo   Antimicrobials: Antiviral Remdesivir   DVT prophylaxis:  Hep drip  Status is: Inpatient  Patient requires at least 2 midnights for further evaluation and treatment of present conditions.        Objective: Vitals:   10/09/21 1014 10/09/21 1112 10/09/21 1200 10/09/21 1536  BP:  107/69  107/69  Pulse:  82 81 77  Resp:  19 19 17   Temp: 98.2 F (36.8 C) 97.8 F (36.6 C)    TempSrc: Oral Axillary    SpO2:  99% 100% 98%  Weight:      Height:        Intake/Output Summary (Last 24 hours) at 10/09/2021 1614 Last data filed at 10/09/2021 1500 Gross per 24 hour  Intake 1558.71 ml  Output 500 ml  Net 1058.71 ml   Filed Weights   10/04/21 1538  Weight: 72.6 kg    Exam:  General: 88 y.o. year-old male frail-appearing minimally responsive.   Cardiovascular: Regular rate and rhythm no rubs or gallops.   Respiratory: Mild rales at bases poor inspiratory effort.   Abdomen: Soft nontender bowel sounds present.   Musculoskeletal: No lower extremity edema bilaterally. Skin: No ulcerative lesions noted. Psychiatry: Unable to assess mood due to obtundation. Neuro: Minimally responsive does not follow commands.   Data  Reviewed: CBC: Recent Labs  Lab 10/04/21 1548 10/05/21 2110 10/06/21 0729 10/07/21 0428 10/08/21 0321 10/08/21 1219 10/09/21 0429  WBC 10.3   < > 12.5* 14.3* 19.2* 19.6* 21.2*  NEUTROABS 8.6*  --   --   --   --  17.5*  --   HGB 16.2   < > 13.7 13.0 14.1 12.6* 12.5*  HCT 51.5   < > 43.5 41.9 43.8 41.2 39.0  MCV 94.7   < > 94.2 95.4 94.0 96.9 94.7  PLT 359   < > 318 314 259 287 298   < > = values in this interval not displayed.   Basic Metabolic Panel: Recent Labs  Lab 10/05/21 0836 10/05/21 1603 10/06/21 0729 10/06/21 2106 10/07/21 0428 10/07/21 1750 10/07/21 1922 10/08/21 0321 10/08/21 0726 10/08/21 0958 10/08/21 1219 10/08/21 2001 10/09/21 0429 10/09/21 1407  NA  --    < > 151*   < > 153* 155*   < > 150*   < > 151* 148* 147* 144 142  K  --    < > 3.8   < > 3.5 3.1*  --  3.5  --   --   --  3.7 3.4* 3.5  CL  --    < > 120*   < > 121* 122*  --  118*  --   --   --  116* 113* 113*  CO2  --    < > 28   < >  25 28  --  25  --   --   --  27 25 24   GLUCOSE  --    < > 139*   < > 119* 113*  --  137*  --   --   --  121* 117* 107*  BUN  --    < > 29*   < > 25* 23  --  22  --   --   --  22 18 18   CREATININE  --    < > 0.95   < > 0.98 0.82  --  0.98  --   --   --  0.87 0.73 0.68  CALCIUM  --    < > 7.8*   < > 7.9* 7.9*  --  7.7*  --   --   --  7.7* 7.6* 7.6*  MG 2.5*  --  2.5*  --  2.7*  --   --  2.3  --   --   --   --  2.3  --   PHOS  --   --  2.3*  --  2.7  --   --  2.7  --   --   --   --  2.5  --    < > = values in this interval not displayed.   GFR: Estimated Creatinine Clearance: 69.3 mL/min (by C-G formula based on SCr of 0.68 mg/dL). Liver Function Tests: Recent Labs  Lab 10/04/21 1548 10/06/21 0729 10/08/21 0321  AST 33 21 18  ALT 20 16 15   ALKPHOS 87 58 56  BILITOT 1.0 0.8 0.8  PROT 9.0* 6.3* 6.1*  ALBUMIN 4.0 2.6* 2.4*   No results for input(s): LIPASE, AMYLASE in the last 168 hours. No results for input(s): AMMONIA in the last 168 hours. Coagulation  Profile: No results for input(s): INR, PROTIME in the last 168 hours. Cardiac Enzymes: No results for input(s): CKTOTAL, CKMB, CKMBINDEX, TROPONINI in the last 168 hours. BNP (last 3 results) No results for input(s): PROBNP in the last 8760 hours. HbA1C: No results for input(s): HGBA1C in the last 72 hours. CBG: No results for input(s): GLUCAP in the last 168 hours. Lipid Profile: No results for input(s): CHOL, HDL, LDLCALC, TRIG, CHOLHDL, LDLDIRECT in the last 72 hours. Thyroid Function Tests: No results for input(s): TSH, T4TOTAL, FREET4, T3FREE, THYROIDAB in the last 72 hours.  Anemia Panel: No results for input(s): VITAMINB12, FOLATE, FERRITIN, TIBC, IRON, RETICCTPCT in the last 72 hours. Urine analysis:    Component Value Date/Time   COLORURINE YELLOW 10/05/2021 0631   APPEARANCEUR HAZY (A) 10/05/2021 0631   LABSPEC 1.028 10/05/2021 0631   PHURINE 5.0 10/05/2021 0631   GLUCOSEU NEGATIVE 10/05/2021 0631   HGBUR SMALL (A) 10/05/2021 0631   BILIRUBINUR NEGATIVE 10/05/2021 0631   KETONESUR 5 (A) 10/05/2021 0631   PROTEINUR NEGATIVE 10/05/2021 0631   UROBILINOGEN 0.2 11/19/2012 1138   NITRITE NEGATIVE 10/05/2021 0631   LEUKOCYTESUR NEGATIVE 10/05/2021 0631   Sepsis Labs: @LABRCNTIP (procalcitonin:4,lacticidven:4)  ) Recent Results (from the past 240 hour(s))  Culture, Respiratory w Gram Stain     Status: None   Collection Time: 10/06/21  2:03 PM   Specimen: Tracheal Aspirate  Result Value Ref Range Status   Specimen Description   Final    TRACHEAL ASPIRATE Performed at [facility], [place] [address], [place], [place] [address]    Special Requests   Final    NONE Performed at [facility], [place]  [address] [place], [place] [address]    Gram Stain   Final    FEW SQUAMOUS EPITHELIAL CELLS PRESENT ABUNDANT WBC PRESENT,BOTH PMN AND MONONUCLEAR ABUNDANT GRAM POSITIVE COCCI FEW GRAM POSITIVE RODS FEW GRAM NEGATIVE RODS Performed at  [facility], [place] [address], [place], [place] [address]    Culture   Final    ABUNDANT STAPHYLOCOCCUS AUREUS MODERATE STREPTOCOCCUS PNEUMONIAE    Report Status 10/09/2021 FINAL  Final   Organism ID, Bacteria STAPHYLOCOCCUS AUREUS  Final   Organism ID, Bacteria STREPTOCOCCUS PNEUMONIAE  Final      Susceptibility   Staphylococcus aureus - MIC*    CIPROFLOXACIN <=0.5 SENSITIVE Sensitive     ERYTHROMYCIN <=0.25 SENSITIVE Sensitive     GENTAMICIN <=0.5 SENSITIVE Sensitive     OXACILLIN 1 SENSITIVE Sensitive     TETRACYCLINE <=1 SENSITIVE Sensitive     VANCOMYCIN <=0.5 SENSITIVE Sensitive     TRIMETH/SULFA <=10 SENSITIVE Sensitive     CLINDAMYCIN <=0.25 SENSITIVE Sensitive     RIFAMPIN <=0.5 SENSITIVE Sensitive     Inducible Clindamycin NEGATIVE Sensitive     * ABUNDANT STAPHYLOCOCCUS AUREUS   Streptococcus pneumoniae - MIC*    ERYTHROMYCIN RESISTANT Resistant     LEVOFLOXACIN 1 SENSITIVE Sensitive     VANCOMYCIN 0.5 SENSITIVE Sensitive     PENICILLIN (meningitis) <=0.06 SENSITIVE Sensitive     PENO - penicillin <=0.06      PENICILLIN (non-meningitis) <=0.06 SENSITIVE Sensitive     PENICILLIN (oral) <=0.06 SENSITIVE Sensitive     CEFTRIAXONE (non-meningitis) <=0.12 SENSITIVE Sensitive     CEFTRIAXONE (meningitis) <=0.12 SENSITIVE Sensitive     * MODERATE STREPTOCOCCUS PNEUMONIAE      Studies: DG CHEST PORT 1 VIEW  Result Date: 10/09/2021 CLINICAL DATA:  Shortness of breath. EXAM: PORTABLE CHEST 1 VIEW COMPARISON:  October 04, 2021. FINDINGS: Stable cardiomediastinal silhouette. Nasogastric tube is seen entering stomach. Right lung is clear. New left lower lobe opacity is noted concerning for pneumonia or atelectasis with associated effusion. Bony thorax is unremarkable. IMPRESSION: New left lower lobe opacity is noted concerning for pneumonia or atelectasis with associated pleural effusion. Electronically Signed   By: Marijo Conception M.D.   On: 10/09/2021 13:55    Scheduled  Meds:  aspirin  81 mg Oral Daily   enoxaparin (LOVENOX) injection  70 mg Subcutaneous Q12H   free water  200 mL Per Tube Q6H   levothyroxine  100 mcg Oral Q0600   liver oil-zinc oxide   Topical TID    Continuous Infusions:  dextrose 5 % with KCl 20 mEq / L 20 mEq (10/09/21 0805)   heparin 1,500 Units/hr (10/09/21 0543)   vancomycin 1,500 mg (10/09/21 1318)     LOS: 5 days     Kayleen Memos, MD [facility] Pager [phone]  If 7PM-7AM, please contact night-coverage [email or web address] Password [email or web address] 10/09/2021, 4:14 PM
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
## 2021-10-09 NOTE — Plan of Care (Signed)
°  Problem: Education: Goal: Knowledge of risk factors and measures for prevention of condition will improve Outcome: Not Progressing

## 2021-10-09 NOTE — Progress Notes (Signed)
Daily Progress Note   Patient Name: Ian Weber       Date: 10/09/2021 DOB: 1934-02-20  Age: 88 y.o. MRN#: [id number] Attending Physician: Kayleen Memos, DO Primary Care Physician: Jilda Panda, MD Admit Date: 10/04/2021  Reason for Consultation/Follow-up: Establishing goals of care  Subjective: I saw and examined Ian Weber today.  He was lying in bed in no distress but remained somnolent throughout my encounter.  His wife is at the bedside and states that he was awake earlier today and talking with her.  She is unable to tell me how much he said or exactly what conversation entailed.  We discussed his clinical course and my concern that he is not going to improve.  She states she does not want him to suffer but also minimizes possibility that he is not going to get better.  She declined for me to call any other family.  Length of Stay: 5  Current Medications: Scheduled Meds:   aspirin  81 mg Oral Daily   enoxaparin (LOVENOX) injection  70 mg Subcutaneous Q12H   free water  200 mL Per Tube Q6H   glycopyrrolate  0.1 mg Intravenous STAT   levothyroxine  100 mcg Oral Q0600   liver oil-zinc oxide   Topical TID    Continuous Infusions:  vancomycin 1,500 mg (10/09/21 1318)    PRN Meds: acetaminophen, levalbuterol  Physical Exam   General: Somnolent.  Copious secretions. Heart: Regular rate and rhythm. No murmur appreciated. Lungs: Coarse throughout Abdomen: Soft, nontender, nondistended, positive bowel sounds.   Ext: No significant edema        Vital Signs: BP 107/69 (BP Location: Left Arm)    Pulse 77    Temp 97.8 F (36.6 C) (Axillary)    Resp 17    Ht 6' (1.829 m)    Wt 72.6 kg    SpO2 98%    BMI 21.70 kg/m  SpO2: SpO2: 98 % O2 Device: O2 Device: Nasal  Cannula O2 Flow Rate: O2 Flow Rate (L/min): 2 L/min  Intake/output summary:  Intake/Output Summary (Last 24 hours) at 10/09/2021 1834 Last data filed at 10/09/2021 1800 Gross per 24 hour  Intake 1847.96 ml  Output 500 ml  Net 1347.96 ml   LBM: Last BM Date: 10/06/21 Baseline Weight: Weight: 72.6 kg Most recent weight:  Weight: 72.6 kg       Palliative Assessment/Data:      Patient Active Problem List   Diagnosis Date Noted   Pressure injury of skin 10/05/2021   COVID-19 virus infection 10/05/2021   Elevated troponin 10/05/2021   Dementia without behavioral disturbance ([place]) 10/05/2021   HTN (hypertension) 10/05/2021   AMS (altered mental status) 10/04/2021   Anxiety 09/24/2017   GIB (gastrointestinal bleeding) 09/24/2017   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   Constipation 01/02/2012   BRBPR (bright red blood per rectum) 01/02/2012   VITAMIN D DEFICIENCY 10/22/2009   Diverticulosis of colon with hemorrhage 10/22/2009   ABDOMINAL PAIN, EPIGASTRIC 10/22/2009   THYROID CANCER, HX OF 10/22/2009   HYPOTHYROIDISM, POSTSURGICAL 10/20/2009   Essential hypertension 10/20/2009   ESOPHAGEAL STRICTURE 10/20/2009   GERD 10/20/2009   HIATAL HERNIA 10/20/2009    Palliative Care Assessment & Plan   Patient Profile: 88 year old male with past medical history of dementia, hypertension, hypothyroidism, GI bleed admitted with weakness, altered mental status, and excess secretions with increased work of breathing in light of known COVID-19 infection.  Recommendations/Plan: DNR/DNI Continue current care.  I discussed with his wife concerned that he is not going to get better and this is terminal process.  She listens, but minimizes my concerns and is not really open to conversation about the likelihood he is not going to get better. Palliative care will follow-up again early next week  Care plan was discussed with wife  Thank you for allowing the Palliative Medicine Team  to assist in the care of this patient.   Time In: 1700 Time Out: 1725 Total Time 25 Prolonged Time Billed No   Adryan Druckenmiller Domingo Cocking, MD  Please contact Palliative Medicine Team phone at [phone] for questions and concerns.

## 2021-10-09 NOTE — Progress Notes (Addendum)
[place] for heparin Indication: acute pulmonary embolus  Allergies  Allergen Reactions   Amlodipine Other (See Comments)    Reaction not recalled by patient   Chlorthalidone Other (See Comments)    Reaction not recalled by patient   Codeine Phosphate Other (See Comments)    Weakness, sweating   Hydrochlorothiazide W-Triamterene Other (See Comments)    Reaction not recalled by patient   Prednisone Itching   Sulfamethoxazole Itching   Triamterene Other (See Comments)    Reaction not recalled by patient    Patient Measurements: Height: 6' (182.9 cm) Weight: 72.6 kg (160 lb) IBW/kg (Calculated) : 77.6 Heparin Dosing Weight: 73 kg  Vital Signs: Temp: 97.8 F (36.6 C) (01/01 1112) Temp Source: Axillary (01/01 1112) BP: 107/69 (01/01 1112) Pulse Rate: 81 (01/01 1200)  Labs: Recent Labs    10/08/21 0321 10/08/21 1219 10/08/21 2001 10/09/21 0429 10/09/21 1407  HGB 14.1 12.6*  --  12.5*  --   HCT 43.8 41.2  --  39.0  --   PLT 259 287  --  298  --   HEPARINUNFRC 0.43  --   --  0.25* 0.61  CREATININE 0.98  --  0.87 0.73 0.68     Estimated Creatinine Clearance: 69.3 mL/min (by C-G formula based on SCr of 0.68 mg/dL).   Assessment: Patient is an 88 y.o M who was recently diagnosed with COVID-19 presented to the ED on 10/04/21 with AMS. Troponin was found to be elevated, but cardiology does not think clinical picture is consistent with ACS.  D-dimer on 10/05/21 was found to be >20 and chest CTA came back positive for bilateral PE.  He's currently on heparin drip for VTE treatment.  Today, 10/09/2021: - heparin level is therapeutic after rate increased to 1500 units/hr - cbc ok - no bleeding documented  Goal of Therapy:  Heparin level 0.3-0.7 units/ml Monitor platelets by anticoagulation protocol: Yes   Plan:  - continue  heparin drip to 1500 units/hr - repeat Heparin level at 10p to ensure level is therapeutic before changing  to daily monitoring - daily CBC and HL - monitor for s/sx bleeding - If no invasive intervention is planned for pt, consider changing to LMWH until pt is able to take oral anticoag.  Dia Sitter, PharmD, BCPS 10/09/2021 3:09 PM ___________________________________  Adden: ok with Dr. [place] Crane to transition pt to lovenox - d/c heparin drip at 6p and then start lovenox 70mg  SQ q12h - change cbc to [place], PharmD, BCPS 10/09/2021 3:28 PM

## 2021-10-09 NOTE — Progress Notes (Addendum)
[place] for heparin Indication: acute pulmonary embolus  Allergies  Allergen Reactions   Amlodipine Other (See Comments)    Reaction not recalled by patient   Chlorthalidone Other (See Comments)    Reaction not recalled by patient   Codeine Phosphate Other (See Comments)    Weakness, sweating   Hydrochlorothiazide W-Triamterene Other (See Comments)    Reaction not recalled by patient   Prednisone Itching   Sulfamethoxazole Itching   Triamterene Other (See Comments)    Reaction not recalled by patient    Patient Measurements: Height: 6' (182.9 cm) Weight: 72.6 kg (160 lb) IBW/kg (Calculated) : 77.6 Heparin Dosing Weight: 73 kg  Vital Signs: Temp: 98.4 F (36.9 C) (01/01 0215) Temp Source: Axillary (01/01 0215) BP: 134/97 (12/31 2000)  Labs: Recent Labs    10/07/21 0428 10/07/21 1750 10/08/21 0321 10/08/21 1219 10/08/21 2001 10/09/21 0429  HGB 13.0  --  14.1 12.6*  --  12.5*  HCT 41.9  --  43.8 41.2  --  39.0  PLT 314  --  259 287  --  298  HEPARINUNFRC 0.44  --  0.43  --   --  0.25*  CREATININE 0.98 0.82 0.98  --  0.87  --      Estimated Creatinine Clearance: 63.7 mL/min (by C-G formula based on SCr of 0.87 mg/dL).   Assessment: Patient is an 88 y.o M who was recently diagnosed with COVID-19 presented to the ED on 10/04/21 with AMS. Troponin was found to be elevated, but cardiology does not think clinical picture is consistent with ACS.  D-dimer on 10/05/21 was found to be >20 and chest CTA came back positive for bilateral PE.  He's currently on heparin drip for VTE treatment.  Today, 10/09/2021: - heparin level 0.25 sub- therapeutic on 1350 units/hr - hgb 12.5, plts ok - no bleeding or interruptions per RN  Goal of Therapy:  Heparin level 0.3-0.7 units/ml Monitor platelets by anticoagulation protocol: Yes   Plan:  - increase heparin drip to 1500 units/hr - Heparin level in 8 hours - daily CBC and HL - monitor  for s/sx bleeding - f/u LE doppler  Dolly Rias RPh 10/09/2021, 5:16 AM

## 2021-10-10 ENCOUNTER — Inpatient Hospital Stay (HOSPITAL_COMMUNITY)
Admit: 2021-10-10 | Discharge: 2021-10-10 | Disposition: A | Discharge status: Home / Self Care | Payer: Medicare HMO | Attending: Neurology | Admitting: Neurology

## 2021-10-10 DIAGNOSIS — Z515 Encounter for palliative care: Secondary | ICD-10-CM | POA: Diagnosis not present

## 2021-10-10 DIAGNOSIS — F0392 Unspecified dementia, unspecified severity, with psychotic disturbance: Secondary | ICD-10-CM | POA: Diagnosis not present

## 2021-10-10 DIAGNOSIS — R400 Somnolence: Secondary | ICD-10-CM | POA: Diagnosis not present

## 2021-10-10 DIAGNOSIS — R4182 Altered mental status, unspecified: Secondary | ICD-10-CM

## 2021-10-10 DIAGNOSIS — G9340 Encephalopathy, unspecified: Secondary | ICD-10-CM

## 2021-10-10 DIAGNOSIS — F0390 Unspecified dementia without behavioral disturbance: Secondary | ICD-10-CM | POA: Diagnosis not present

## 2021-10-10 DIAGNOSIS — Z7189 Other specified counseling: Secondary | ICD-10-CM | POA: Diagnosis not present

## 2021-10-10 LAB — CBC
HCT: 36.5 % — ABNORMAL LOW (ref 39.0–52.0)
Hemoglobin: 11.6 g/dL — ABNORMAL LOW (ref 13.0–17.0)
MCH: 29.7 pg (ref 26.0–34.0)
MCHC: 31.8 g/dL (ref 30.0–36.0)
MCV: 93.4 fL (ref 80.0–100.0)
Platelets: 315 10*3/uL (ref 150–400)
RBC: 3.91 MIL/uL — ABNORMAL LOW (ref 4.22–5.81)
RDW: 13.2 % (ref 11.5–15.5)
WBC: 17.2 10*3/uL — ABNORMAL HIGH (ref 4.0–10.5)
nRBC: 0 % (ref 0.0–0.2)

## 2021-10-10 LAB — MAGNESIUM
Magnesium: 2.2 mg/dL (ref 1.7–2.4)
Magnesium: 2 mg/dL (ref 1.7–2.4)

## 2021-10-10 LAB — GLUCOSE, CAPILLARY: Glucose-Capillary: 127 mg/dL — ABNORMAL HIGH (ref 70–99)

## 2021-10-10 LAB — PHOSPHORUS
Phosphorus: 2.3 mg/dL — ABNORMAL LOW (ref 2.5–4.6)
Phosphorus: 2.2 mg/dL — ABNORMAL LOW (ref 2.5–4.6)

## 2021-10-10 MED ORDER — VITAL HIGH PROTEIN PO LIQD: 1000.0000 mL | ORAL | Status: DC

## 2021-10-10 MED ORDER — THIAMINE HCL 100 MG/ML IJ SOLN
500.0000 mg | Freq: Three times a day (TID) | INTRAVENOUS | Status: AC
  Administered 2021-10-10 – 2021-10-12 (×9): 500 mg via INTRAVENOUS
  Filled 2021-10-10: qty 2
  Filled 2021-10-10 (×12): qty 5

## 2021-10-10 MED ORDER — OSMOLITE 1.5 CAL PO LIQD
1000.0000 mL | ORAL | Status: DC
  Administered 2021-10-10 – 2021-10-19 (×6): 1000 mL
  Filled 2021-10-10 (×13): qty 1000

## 2021-10-10 MED ORDER — SODIUM CHLORIDE 0.9 % IV SOLN
2.0000 g | INTRAVENOUS | Status: DC
  Administered 2021-10-10 – 2021-10-13 (×22): 2 g via INTRAVENOUS
  Filled 2021-10-10 (×2): qty 2
  Filled 2021-10-10: qty 2000
  Filled 2021-10-10 (×3): qty 2
  Filled 2021-10-10 (×3): qty 2000
  Filled 2021-10-10: qty 2
  Filled 2021-10-10 (×3): qty 2000
  Filled 2021-10-10: qty 2
  Filled 2021-10-10 (×2): qty 2000
  Filled 2021-10-10: qty 2
  Filled 2021-10-10: qty 2000
  Filled 2021-10-10: qty 2
  Filled 2021-10-10 (×4): qty 2000
  Filled 2021-10-10 (×2): qty 2

## 2021-10-10 MED ORDER — DEXTROSE 5 % IV SOLN
10.0000 mg/kg | Freq: Three times a day (TID) | INTRAVENOUS | Status: DC
  Administered 2021-10-10 – 2021-10-13 (×11): 725 mg via INTRAVENOUS
  Filled 2021-10-10 (×10): qty 14.5
  Filled 2021-10-10: qty 10
  Filled 2021-10-10 (×4): qty 14.5

## 2021-10-10 MED ORDER — SODIUM CHLORIDE 0.9 % IV SOLN: INTRAVENOUS | Status: DC

## 2021-10-10 MED ORDER — FREE WATER
200.0000 mL | Status: DC
  Administered 2021-10-10 – 2021-10-11 (×3): 200 mL

## 2021-10-10 MED ORDER — SODIUM CHLORIDE 0.9 % IV SOLN
2.0000 g | Freq: Two times a day (BID) | INTRAVENOUS | Status: DC
  Administered 2021-10-10 – 2021-10-20 (×21): 2 g via INTRAVENOUS
  Filled 2021-10-10 (×21): qty 20

## 2021-10-10 MED ORDER — VANCOMYCIN HCL 1000 MG/200ML IV SOLN
1000.0000 mg | Freq: Two times a day (BID) | INTRAVENOUS | Status: DC
  Administered 2021-10-10 – 2021-10-13 (×7): 1000 mg via INTRAVENOUS
  Filled 2021-10-10 (×7): qty 200

## 2021-10-10 MED ORDER — PROSOURCE TF PO LIQD
45.0000 mL | Freq: Three times a day (TID) | ORAL | Status: DC
  Administered 2021-10-10 – 2021-10-19 (×26): 45 mL
  Filled 2021-10-10 (×31): qty 45

## 2021-10-10 NOTE — Procedures (Signed)
Patient Name: Ian Weber  MRN: [id number]  Epilepsy Attending: Lora Havens  Referring Physician/Provider: Dr Kerney Elbe Date: 10/10/2021 Duration: 22.01 mins  Patient history: 88 year old male with AMS in the setting of Covid infection and dementia. EEG to evaluate for seizure  Level of alertness:  lethargic   AEDs during EEG study: None  Technical aspects: This EEG study was done with scalp electrodes positioned according to the 10-20 International system of electrode placement. Electrical activity was acquired at a sampling rate of 500Hz  and reviewed with a high frequency filter of 70Hz  and a low frequency filter of 1Hz . EEG data were recorded continuously and digitally stored.   Description: EEG showed continuous generalized 3 to 6 Hz theta-delta slowing. Hyperventilation and photic stimulation were not performed.     ABNORMALITY - Continuous slow, generalized  IMPRESSION: This study is suggestive of moderate diffuse encephalopathy, nonspecific etiology. No seizures or epileptiform discharges were seen throughout the recording.   Kelcey Korus Barbra Sarks

## 2021-10-10 NOTE — Progress Notes (Signed)
PROGRESS NOTE  Ian Weber CXK:[id number] DOB: [birth year] DOA: 10/04/2021 PCP: Jilda Panda, MD  HPI/Recap of past 24 hours: Ian Weber is a 88 y.o. male with medical history significant for dementia, hypertension, hypothyroidism, history of GI bleed who presents from home with concerns of increasing altered mental status and generalized weakness.  Associated with poor appetite and poor oral intake.  Was brought to the ED for further evaluation.  Recent COVID-19 positive 09/28/21.  Started on antiviral IV remdesivir.  Hospital course complicated by findings of B/L pulmonary embolism, started hep drip 10/05/21. Minimal responsiveness, restless agitation delirium reported early morning 12/30.  Heparin drip switched to full dose subcu Lovenox on 10/09/2021.  10/09/2021: Patient was seen and examined at bedside.  Minimally responsive.  Sputum culture returned positive for staph aureus and Streptococcus pneumoniae sensitive to IV vancomycin, resistant to erythromycin.    Updated his wife at bedside.  No oral intake for several days.  She wants to hold off on feeding tube.  Poor prognosis, palliative care team consulted and following.    10/10/21: Patient was seen and examined at his bedside.  Minimally responsive.  MRI brain negative for any acute intracranial abnormalities.  No seizure or epileptiform discharges were seen throughout the recording.  Assessment/Plan: Principal Problem:   AMS (altered mental status) Active Problems:   Pressure injury of skin   COVID-19 virus infection   Elevated troponin   Dementia without behavioral disturbance (HCC)   HTN (hypertension)   Persistent acute metabolic encephalopathy, multifactorial secondary to COVID-19 viral infection and hypernatremia from poor oral intake, dehydration.  Covid 19+ 09/28/21 CT head nonacute, CT abdomen and pelvis with contrast nonacute. NG tube placement for hydration on 10/07/2021.  Continue free water flushing to avoid  dehydration. No oral intake for several days, updated his wife at bedside.  She declines tube feeding on 10/09/2021.  After discussing with PCCM, family agreed to tube feeding, started 10/09/21 osmolite 1.2 cal trickle feeds.  Dietitian consulted. Ammonia, B12, TSH, MRI brain, Trop, all nonrevealing.  Blood cx x 2 peripherally, in process. Rule out vitamin D insufficiency and meningitis Follow thiamine level, follow lumbar puncture results Plan LP on 10/11/2021, hold off Lovenox and n.p.o. after midnight (hold off tube feedings after midnight)   COVID-19 viral infection with Staph aureus and Streptococcus pneumonia coinfection, POA Sputum culture obtained on 10/06/2021, returned positive for staph aureus and Streptococcus pneumoniae sensitive to IV vancomycin, resistant to erythromycin.   Continue IV vancomycin  Newly diagnosed left lower lobe Staph aureus and Streptococcus community-acquired pneumonia, POA Management as stated above Monitor fever curve and WBC  Newly diagnosed bilateral acute pulmonary embolism Started on heparin drip on 10/05/2021, switched to subcu Lovenox on 10/09/2021. No evidence of right heart strain on CT scan.  Left pleural effusion in setting of pneumonia Hold off IV fluid hydration to avoid worsening of left pleural effusion Continue IV antibiotics  Resolved hypovolemic hypernatremia in the setting of poor oral intake Free water flushes 200 cc every 6 hours. Hold off IV fluid hydration to avoid worsening left pleural effusion.  Acute hypoxic respiratory failure secondary to above Continue to maintain oxygen saturation greater than 92% Hypoxemia seen on ABG 10/06/2021  Elevated troponin in the setting of covid 19 infection and bilateral pulmonary embolism Trop S peaked at >4300 and down trended Continue to monitor on telemetry  Prolonged QTC QTC greater than 500 on 12 EKG done on 10/04/2021 Unable to obtain 12 EKG as the patient would  not cooperate. Continue  to optimize magnesium and potassium levels Continue to avoid QTC prolonging agents  Covid-19 viral infection, POA IV antiviral  Resolved post repletion: Hypokalemia K+ 3.2> 3.8 Continue to replete electrolytes as indicated.  Dementia Continue fall/aspiration/delirium precautions  Goals of care Palliative care team consulted to assist with establishing goals of care Ongoing discussion of goals of care respiratory treatment family.  DNR as of 10/07/2021  Pressure Injury 10/04/21 Buttocks Bilateral Stage 2 -  Partial thickness loss of dermis presenting as a shallow open injury with a red, pink wound bed without slough. (Active) 10/04/21 2300 Location: Buttocks Location Orientation: Bilateral Staging: Stage 2 -  Partial thickness loss of dermis presenting as a shallow open injury with a red, pink wound bed without slough. Wound Description (Comments): Present on Admission:Yes    Code Status: DNR  Family Communication: Updated his wife at bedside.  Disposition Plan: Poor prognosis.   Consultants: Palliative care team  Procedures: 2D echo   Antimicrobials: Antiviral Remdesivir   DVT prophylaxis:  Hep drip  Status is: Inpatient  Patient requires at least 2 midnights for further evaluation and treatment of present conditions.        Objective: Vitals:   10/09/21 1536 10/09/21 2000 10/10/21 0525 10/10/21 1200  BP: 107/69 117/75  112/68  Pulse: 77 78  82  Resp: 17 20  19   Temp:  98.1 F (36.7 C) 98.9 F (37.2 C) 98.8 F (37.1 C)  TempSrc:  Axillary Axillary Axillary  SpO2: 98% 97%  97%  Weight:      Height:        Intake/Output Summary (Last 24 hours) at 10/10/2021 1614 Last data filed at 10/10/2021 1500 Gross per 24 hour  Intake 1695.49 ml  Output --  Net 1695.49 ml   Filed Weights   10/04/21 1538  Weight: 72.6 kg    Exam:  General: 88 y.o. year-old male frail-appearing no acute distress.  He is minimally responsive.  Responds to painful  stimuli. Cardiovascular: Regular Rate and rhythm no rubs or gallops. Respiratory: Mild rales diffusely. Poor inspiratory efforts.  Abdomen: Soft non tender non distended with normal bowel sounds.   Musculoskeletal: No lower extremity edema. Skin: No ulcerative lesions. Psychiatry: Hyper somnolent, unable to assess.   Neuro: Minimally responsive   Data Reviewed: CBC: Recent Labs  Lab 10/04/21 1548 10/05/21 2110 10/08/21 0321 10/08/21 1219 10/09/21 0429 10/09/21 1835 10/10/21 0412  WBC 10.3   < > 19.2* 19.6* 21.2* 17.4* 17.2*  NEUTROABS 8.6*  --   --  17.5*  --  15.5*  --   HGB 16.2   < > 14.1 12.6* 12.5* 12.0* 11.6*  HCT 51.5   < > 43.8 41.2 39.0 38.5* 36.5*  MCV 94.7   < > 94.0 96.9 94.7 95.8 93.4  PLT 359   < > 259 287 298 292 315   < > = values in this interval not displayed.   Basic Metabolic Panel: Recent Labs  Lab 10/07/21 0428 10/07/21 1750 10/08/21 0321 10/08/21 0726 10/08/21 1219 10/08/21 2001 10/09/21 0429 10/09/21 1407 10/09/21 1835 10/10/21 1319  NA 153*   < > 150*   < > 148* 147* 144 142 143  --   K 3.5   < > 3.5  --   --  3.7 3.4* 3.5 3.7  --   CL 121*   < > 118*  --   --  116* 113* 113* 115*  --   CO2 25   < >  25  --   --  27 25 24 24   --   GLUCOSE 119*   < > 137*  --   --  121* 117* 107* 89  --   BUN 25*   < > 22  --   --  22 18 18 19   --   CREATININE 0.98   < > 0.98  --   --  0.87 0.73 0.68 0.73  --   CALCIUM 7.9*   < > 7.7*  --   --  7.7* 7.6* 7.6* 7.5*  --   MG 2.7*  --  2.3  --   --   --  2.3  --  2.4 2.2  PHOS 2.7  --  2.7  --   --   --  2.5  --  3.0 2.3*   < > = values in this interval not displayed.   GFR: Estimated Creatinine Clearance: 69.3 mL/min (by C-G formula based on SCr of 0.73 mg/dL). Liver Function Tests: Recent Labs  Lab 10/04/21 1548 10/06/21 0729 10/08/21 0321 10/09/21 1835  AST 33 21 18 19   ALT 20 16 15 15   ALKPHOS 87 58 56 58  BILITOT 1.0 0.8 0.8 1.0  PROT 9.0* 6.3* 6.1* 5.6*  ALBUMIN 4.0 2.6* 2.4* 2.3*   No  results for input(s): LIPASE, AMYLASE in the last 168 hours. Recent Labs  Lab 10/09/21 1835  AMMONIA 18   Coagulation Profile: No results for input(s): INR, PROTIME in the last 168 hours. Cardiac Enzymes: No results for input(s): CKTOTAL, CKMB, CKMBINDEX, TROPONINI in the last 168 hours. BNP (last 3 results) No results for input(s): PROBNP in the last 8760 hours. HbA1C: No results for input(s): HGBA1C in the last 72 hours. CBG: No results for input(s): GLUCAP in the last 168 hours. Lipid Profile: No results for input(s): CHOL, HDL, LDLCALC, TRIG, CHOLHDL, LDLDIRECT in the last 72 hours. Thyroid Function Tests: Recent Labs    10/09/21 1835  TSH 2.714    Anemia Panel: Recent Labs    10/09/21 1835  VITAMINB12 504   Urine analysis:    Component Value Date/Time   COLORURINE YELLOW 10/05/2021 0631   APPEARANCEUR HAZY (A) 10/05/2021 0631   LABSPEC 1.028 10/05/2021 0631   PHURINE 5.0 10/05/2021 0631   GLUCOSEU NEGATIVE 10/05/2021 0631   HGBUR SMALL (A) 10/05/2021 0631   BILIRUBINUR NEGATIVE 10/05/2021 0631   KETONESUR 5 (A) 10/05/2021 0631   PROTEINUR NEGATIVE 10/05/2021 0631   UROBILINOGEN 0.2 11/19/2012 1138   NITRITE NEGATIVE 10/05/2021 0631   LEUKOCYTESUR NEGATIVE 10/05/2021 0631   Sepsis Labs: @LABRCNTIP (procalcitonin:4,lacticidven:4)  ) Recent Results (from the past 240 hour(s))  Culture, Respiratory w Gram Stain     Status: None   Collection Time: 10/06/21  2:03 PM   Specimen: Tracheal Aspirate  Result Value Ref Range Status   Specimen Description   Final    TRACHEAL ASPIRATE Performed at [facility] [address] [place], [place] [address]    Special Requests   Final    NONE Performed at [facility], [place] [address] [place], [place] [address]    Gram Stain   Final    FEW SQUAMOUS EPITHELIAL CELLS PRESENT ABUNDANT WBC PRESENT,BOTH PMN AND MONONUCLEAR ABUNDANT GRAM POSITIVE COCCI FEW GRAM POSITIVE RODS FEW  GRAM NEGATIVE RODS Performed at [facility], [place] [address], [place], [place] [address]    Culture   Final    ABUNDANT STAPHYLOCOCCUS AUREUS MODERATE STREPTOCOCCUS PNEUMONIAE  Report Status 10/09/2021 FINAL  Final   Organism ID, Bacteria STAPHYLOCOCCUS AUREUS  Final   Organism ID, Bacteria STREPTOCOCCUS PNEUMONIAE  Final      Susceptibility   Staphylococcus aureus - MIC*    CIPROFLOXACIN <=0.5 SENSITIVE Sensitive     ERYTHROMYCIN <=0.25 SENSITIVE Sensitive     GENTAMICIN <=0.5 SENSITIVE Sensitive     OXACILLIN 1 SENSITIVE Sensitive     TETRACYCLINE <=1 SENSITIVE Sensitive     VANCOMYCIN <=0.5 SENSITIVE Sensitive     TRIMETH/SULFA <=10 SENSITIVE Sensitive     CLINDAMYCIN <=0.25 SENSITIVE Sensitive     RIFAMPIN <=0.5 SENSITIVE Sensitive     Inducible Clindamycin NEGATIVE Sensitive     * ABUNDANT STAPHYLOCOCCUS AUREUS   Streptococcus pneumoniae - MIC*    ERYTHROMYCIN RESISTANT Resistant     LEVOFLOXACIN 1 SENSITIVE Sensitive     VANCOMYCIN 0.5 SENSITIVE Sensitive     PENICILLIN (meningitis) <=0.06 SENSITIVE Sensitive     PENO - penicillin <=0.06      PENICILLIN (non-meningitis) <=0.06 SENSITIVE Sensitive     PENICILLIN (oral) <=0.06 SENSITIVE Sensitive     CEFTRIAXONE (non-meningitis) <=0.12 SENSITIVE Sensitive     CEFTRIAXONE (meningitis) <=0.12 SENSITIVE Sensitive     * MODERATE STREPTOCOCCUS PNEUMONIAE      Studies: MR BRAIN WO CONTRAST  Result Date: 10/09/2021 CLINICAL DATA:  Initial evaluation for acute delirium. EXAM: MRI HEAD WITHOUT CONTRAST TECHNIQUE: Multiplanar, multiecho pulse sequences of the brain and surrounding structures were obtained without intravenous contrast. COMPARISON:  Comparison made with prior head CT from 10/08/2021. FINDINGS: Brain: Cerebral volume within normal limits for age. Patchy T2/FLAIR hyperintensity involving the periventricular and deep white matter both cerebral hemispheres as well as the pons, most consistent with chronic small  vessel ischemic disease, moderate in nature. No abnormal foci of restricted diffusion to suggest acute or subacute ischemia. Gray-white matter differentiation maintained. No encephalomalacia to suggest chronic cortical infarction. No evidence for acute or chronic intracranial hemorrhage. No mass lesion, midline shift or mass effect. No hydrocephalus or extra-axial fluid collection. Pituitary gland suprasellar region normal. Midline structures intact. Vascular: Major intracranial vascular flow voids are maintained. Skull and upper cervical spine: Craniocervical junction within normal limits. Bone marrow signal intensity grossly normal. No scalp soft tissue abnormality. Sinuses/Orbits: Patient status post bilateral ocular lens replacement. Globes and orbital soft tissues demonstrate no acute finding. Mild scattered mucosal thickening noted within the ethmoidal air cells. Paranasal sinuses are otherwise clear. No mastoid effusion. Other: None. IMPRESSION: 1. No acute intracranial abnormality. 2. Moderate chronic microvascular ischemic disease. Electronically Signed   By: Jeannine Boga M.D.   On: 10/09/2021 20:40   EEG adult  Result Date: 10/10/2021 Lora Havens, MD     10/10/2021 11:23 AM Patient Name: Ian Weber MRN: [id number] Epilepsy Attending: Lora Havens Referring Physician/Provider: Dr Kerney Elbe Date: 10/10/2021 Duration: 22.01 mins Patient history: 88 year old male with AMS in the setting of Covid infection and dementia. EEG to evaluate for seizure Level of alertness:  lethargic AEDs during EEG study: None Technical aspects: This EEG study was done with scalp electrodes positioned according to the 10-20 International system of electrode placement. Electrical activity was acquired at a sampling rate of 500Hz  and reviewed with a high frequency filter of 70Hz  and a low frequency filter of 1Hz . EEG data were recorded continuously and digitally stored. Description: EEG showed continuous  generalized 3 to 6 Hz theta-delta slowing. Hyperventilation and photic stimulation were not performed.   ABNORMALITY - Continuous  slow, generalized IMPRESSION: This study is suggestive of moderate diffuse encephalopathy, nonspecific etiology. No seizures or epileptiform discharges were seen throughout the recording. Priyanka Barbra Sarks    Scheduled Meds:  aspirin  81 mg Oral Daily   feeding supplement (PROSource TF)  45 mL Per Tube TID   free water  200 mL Per Tube Q4H   levothyroxine  100 mcg Oral Q0600   liver oil-zinc oxide   Topical TID    Continuous Infusions:  sodium chloride 125 mL/hr at 10/10/21 1547   acyclovir 725 mg (10/10/21 1411)   ampicillin (OMNIPEN) IV 2 g (10/10/21 1552)   cefTRIAXone (ROCEPHIN)  IV 2 g (10/10/21 0954)   feeding supplement (OSMOLITE 1.5 CAL)     thiamine injection 500 mg (10/10/21 1554)   vancomycin 1,000 mg (10/10/21 1335)     LOS: 6 days     Kayleen Memos, MD [facility] Pager [phone]  If 7PM-7AM, please contact night-coverage [email or web address] Password [facility] 10/10/2021, 4:14 PM

## 2021-10-10 NOTE — Progress Notes (Signed)
Nutrition Follow-up  DOCUMENTATION CODES:   Not applicable  INTERVENTION:  - will adjust TF regimen: Osmolite 1.5 @ 20  ml/hr to advance by 10 ml every 24 hours to reach goal rate of 50 ml/hr with 45 ml Prosource TF TID and 200 ml free water every 4 hours. - at goal rate, this regimen will provide 1920 kcal, 108 grams protein, and 2117 ml free water. - weigh today.   Monitor magnesium, potassium, and phosphorus BID for at least 3 days, MD to replete as needed, as pt is at risk for refeeding syndrome given no nutrition for at least 6 days.    NUTRITION DIAGNOSIS:   Inadequate oral intake related to inability to eat as evidenced by NPO status. -ongoing  GOAL:   Patient will meet greater than or equal to 90% of their needs -unmet at this time  MONITOR:   TF tolerance, Labs, Weight trends, Skin  REASON FOR ASSESSMENT:   Consult Enteral/tube feeding initiation and management  ASSESSMENT:   89 yo male with a PMH of dementia, hypertension, hypothyroidism, history of GI bleed who presents with concerns of increasing altered mental status. He tested positive for COVID about 6 days ago and since then has had decreased appetite. Admitted with AMS.  Patient is out of the room to [facility] for EEG at this time. He has been NPO since admission on 12/27. No NGT/small bore NGT listed in flow sheet. Able to talk with MD via secure chat who shares that NGT was placed yesterday and patient started on trickle rate TF.  CXR from 1/1 and findings listed as "nasogastric tube is seen entering stomach".   Orders in place for Osmolite 1.2 @ 15 ml/hr with 200 ml free water QID. This regimen provides 432 kcal, 7 grams protein, and 1095 ml free water.  He has not been weighed since admission on 12/27. Moderate pitting edema to BLE documented in the edema section of flow sheet.   Palliative Care is following and note from yesterday indicates that patient is DNR/DNI.   Labs reviewed; Cl: 115 mmol/l, Ca: 7.5  mg/dl.  Medications reviewed; 100 mcg oral synthroid/day, 10 mEq IV KCl x3 runs 1/1, 500 mg IV thiamine TID 1/2-1/4.  IVF; NS @ 125 ml/hr.     NUTRITION - FOCUSED PHYSICAL EXAM:  Out of the room  Diet Order:   Diet Order             Diet NPO time specified Except for: Ice Chips  Diet effective now                   EDUCATION NEEDS:   Not appropriate for education at this time  Skin:  Skin Assessment: Skin Integrity Issues: Skin Integrity Issues:: Stage II Stage II: Bilateral buttocks and sacral  Last BM:  12/29 (Type 7 smear)  Height:   Ht Readings from Last 1 Encounters:  10/04/21 6' (1.829 m)    Weight:   Wt Readings from Last 1 Encounters:  10/04/21 72.6 kg     Estimated Nutritional Needs:  Kcal:  1900-2100 Protein:  95-110 grams Fluid:  >1.9 L      Jarome Matin, MS, RD, LDN, CNSC Inpatient Clinical Dietitian RD pager # available in AMION  After hours/weekend pager # available in [facility]

## 2021-10-10 NOTE — Progress Notes (Signed)
EEG complete - results pending 

## 2021-10-10 NOTE — Progress Notes (Signed)
SLP Cancellation Note  Patient Details Name: Ian Weber MRN: [id number] DOB: 18-Apr-1934   Cancelled treatment:       Reason Eval/Treat Not Completed: Patient's level of consciousness;Other (comment) (per chart review, OT cancel note from today, patient minimally responsive. SLP will follow patient for readiness for PO trials.)   Sonia Baller, MA, CCC-SLP Speech Therapy

## 2021-10-10 NOTE — Progress Notes (Signed)
Palliative:  HPI: 88 y.o. male  with past medical history of dementia, hypertension, hypothyroidism, GIB admitted on 10/04/2021 with weakness - bedbound, altered mental status - no longer conversant, poor intake, labored breathing with known COVID infection.    I met again today at Mr. Hedgepath' bedside with wife. Ian Weber continues with severe aspiration risk with audible secretions and gurgling from the doorway. He continues in critical condition. I explained to wife that I continue to be concerned that he is unlikely to recover from this illness. I explained that his dementia in combination with his acute COVID infection and acute illness has altered the trajectory of his disease and he has very poor prognosis. Wife has very strong faith and reports that she is hopeful that God will heal him. She is also able to acknowledge that God may not heal him and if it is "his time" then this is God's will.   I continue to worry that Ian Weber is suffering greatly and that this illness is not survivable. I have not spoken with any other family members but would continue to recommend ongoing honest conversations with family as available regarding poor prognosis and expectations.   Exam: Mostly unresponsive. Thin, frail. Severe, copious oral secretions unable to suction with oral suction. Severe aspiration risk. Breathing labored with tachypnea. Abd soft, flat. Warm to touch.   Plan: - Recommend ongoing family conversations and support.  - DNR decided but no other decisions made at this time.   25 min  Vinie Sill, NP Palliative Medicine Team Pager [phone] (Please see [email or web address] for schedule) Team Phone [phone]    Greater than 50%  of this time was spent counseling and coordinating care related to the above assessment and plan

## 2021-10-10 NOTE — Progress Notes (Signed)
OT Cancellation Note  Patient Details Name: OREE HISLOP MRN: [id number] DOB: 08/19/1934   Cancelled Treatment:    Reason Eval/Treat Not Completed: Patient not medically ready. Per chart review and conversation with RN, pt remains minimally responsive. At baseline, total A for ADLs, able to transfer to w/c.  Will sign off for now.  Tyrone Schimke, [facility] Office: [phone]  10/10/2021, 9:17 AM

## 2021-10-10 NOTE — Progress Notes (Signed)
Pharmacy Antibiotic Note  Ian Weber is a 88 y.o. male admitted on 10/04/2021 with meningitis.  Pharmacy has been consulted for acyclovir dosing.  Plan: Acylovir 10mg /kg IV q8 per current renal function Continue vanc, ampicillin and rocephin  Height: 6' (182.9 cm) Weight: 72.6 kg (160 lb) IBW/kg (Calculated) : 77.6  Temp (24hrs), Avg:98.3 F (36.8 C), Min:97.8 F (36.6 C), Max:98.9 F (37.2 C)  Recent Labs  Lab 10/04/21 1641 10/04/21 2020 10/05/21 0359 10/06/21 1007 10/06/21 2106 10/08/21 0321 10/08/21 1219 10/08/21 2001 10/09/21 0429 10/09/21 1407 10/09/21 1835 10/10/21 0412  WBC  --   --    < >  --    < > 19.2* 19.6*  --  21.2*  --  17.4* 17.2*  CREATININE  --   --    < >  --    < > 0.98  --  0.87 0.73 0.68 0.73  --   LATICACIDVEN 2.0* 2.1*  --  1.7  --   --  1.9  --   --   --  1.2  --    < > = values in this interval not displayed.    Estimated Creatinine Clearance: 69.3 mL/min (by C-G formula based on SCr of 0.73 mg/dL).    Allergies  Allergen Reactions   Amlodipine Other (See Comments)    Reaction not recalled by patient   Chlorthalidone Other (See Comments)    Reaction not recalled by patient   Codeine Phosphate Other (See Comments)    Weakness, sweating   Hydrochlorothiazide W-Triamterene Other (See Comments)    Reaction not recalled by patient   Prednisone Itching   Sulfamethoxazole Itching   Triamterene Other (See Comments)    Reaction not recalled by patient      Thank you for allowing pharmacy to be a part of this patients care.  Kara Mead 10/10/2021 6:58 AM

## 2021-10-10 NOTE — Consult Note (Signed)
NEURO HOSPITALIST CONSULT NOTE   Requestig physician: Dr. Nevada Crane  Reason for Consult: Delirium in the setting of Covid infection  History obtained from:  Wife and Chart     HPI:                                                                                                                                          Ian Weber is an 88 y.o. male with a PMHx of dementia, HTN, hypothyroidism, sacral decubitus ulcer, FTT and GIB who presented to [facility] on 12/27 with worsened mentation. At baseline, all of his ADLs are performed by his wife. He is only able to help with transfers from bed to chair. He usually is alert but not oriented. No substance abuse.   He was diagnosed with Covid on admission.   The patient's wife at the bedside is a poor historian. Hospitalist admission note reviewed: "He tested positive for COVID about 6 days ago and since then has had decreased appetite.  Usually at baseline he is able to help wife with transfer but for the last couple days has become increasingly more bedbound.  Patient has been grunting, not able to carry conversation or opening his eyes. Has not complaint of pain but wife noticed increase labored respiration. ED Course: He was afebrile, normotensive on room air. CBC unremarkable.  Lactate of 2. Sodium of 148, K of 3.9, creatinine of 1.01, BG of 116. Troponin of 4339 with EKG showing RBBB and left anterior fascicular block. Cardiology was consulted by ED PA who feels this more likely demand ischemia and does not recommend starting IV heparin unless he has a significant delta change on the next troponin."  Past Medical History:  Diagnosis Date   Anxiety    Chronic venous stasis    Depression    Diverticulosis    Diverticulosis of colon (without mention of hemorrhage)    Esophageal reflux    GERD (gastroesophageal reflux disease)    Hiatal hernia    History of BPH    Hypothyroidism    Irritable bowel syndrome    Lactose intolerance     Stricture and stenosis of esophagus    Thyroid cancer ([place])    Vitamin D insufficiency     Past Surgical History:  Procedure Laterality Date   CARDIOVERSION  08/08/2012   Procedure: CARDIOVERSION;  Surgeon: Laverda Page, MD;  Location: [facility];  Service: Cardiovascular;  Laterality: N/A;   CATARACT EXTRACTION     COLONOSCOPY     COLONOSCOPY WITH PROPOFOL N/A 09/25/2017   Procedure: COLONOSCOPY WITH PROPOFOL;  Surgeon: Gatha Mayer, MD;  Location: [facility];  Service: Endoscopy;  Laterality: N/A;  moderate sedation acceptable also   [place]  Dr Deon Pilling    Family History  Family history unknown: Yes              Social History:  reports that he has never smoked. He has never used smokeless tobacco. He reports that he does not drink alcohol and does not use drugs.  Allergies  Allergen Reactions   Amlodipine Other (See Comments)    Reaction not recalled by patient   Chlorthalidone Other (See Comments)    Reaction not recalled by patient   Codeine Phosphate Other (See Comments)    Weakness, sweating   Hydrochlorothiazide W-Triamterene Other (See Comments)    Reaction not recalled by patient   Prednisone Itching   Sulfamethoxazole Itching   Triamterene Other (See Comments)    Reaction not recalled by patient    MEDICATIONS:                                                                                                                     Prior to Admission:  Medications Prior to Admission  Medication Sig Dispense Refill Last Dose   aspirin 81 MG tablet Take 1 tablet (81 mg total) by mouth daily. 30 tablet  10/03/2021   furosemide (LASIX) 40 MG tablet Take 40 mg by mouth daily.   10/03/2021   levothyroxine (SYNTHROID) 100 MCG tablet Take 100 mcg by mouth daily.   10/04/2021   losartan (COZAAR) 25 MG tablet Take 25 mg by mouth daily.   10/03/2021   nebivolol (BYSTOLIC) 2.5 MG tablet Take 2.5 mg by mouth daily.   10/03/2021    polyethylene glycol powder (GLYCOLAX/MIRALAX) powder TAKE 17 G BY MOUTH 2 (TWO) TIMES DAILY. (Patient not taking: Reported on 09/28/2021) 1054 g 2 Not Taking   senna-docusate (SENOKOT-S) 8.6-50 MG tablet Take 1 tablet by mouth daily as needed for moderate constipation. (Patient not taking: Reported on 09/28/2021) 30 tablet 0 Not Taking   Scheduled:  aspirin  81 mg Oral Daily   enoxaparin (LOVENOX) injection  70 mg Subcutaneous Q12H   free water  200 mL Per Tube Q6H   levothyroxine  100 mcg Oral Q0600   liver oil-zinc oxide   Topical TID   Continuous:  ampicillin (OMNIPEN) IV     cefTRIAXone (ROCEPHIN)  IV     feeding supplement (OSMOLITE 1.2 CAL) 1,000 mL (10/09/21 2148)   thiamine injection     vancomycin 1,500 mg (10/09/21 1318)     ROS:  Unable to obtain due to lethargy.    Blood pressure 117/75, pulse 78, temperature 98.9 F (37.2 C), temperature source Axillary, resp. rate 20, height 6' (1.829 m), weight 72.6 kg, SpO2 97 %.   General Examination:                                                                                                       Physical Exam  HEENT-  [place]/AT. Nuchal rigidity is noted.   Lungs- Coughs frequently during exam.  Extremities- Skin changes from chronic edema noted to distal lower extremities bilaterally.   Neurological Examination Mental Status: Assessment of mental status reveals a lethargic patient who will attempt to answer questions with hypophonic, unintelligible monosyllabic vocalizations, will not open eyes or follow other commands, but will swat at examiner equally with BUE to arm pinch and sternal rub.   Cranial Nerves:  II: Clenches eyes tightly when examiner attempts to open. Will not open to request. Unable to assess pupils or visual acuity.  III,IV, VI: Unable to assess.  V: Clenches eyelids tightly  when touched.  VII: Grimaces and furrows brow symmetrically to stimuli.  VIII: Hearing intact to auditory stimuli IX,X: Unable to assess XI: Head is midline XII: Does not follow commands Motor/Sensory: Deos not follow motor commands, but will swat at examiner forcefully and equally with BUE to arm pinch and sternal rub. Weak withdrawal of BLE to noxious plantar stimulation, without asymmetry noted.  Deep Tendon Reflexes: 1+ and symmetric brachioradialis and patellar reflexes.  Plantars: Mute bilaterally Cerebellar/Gait: Unable to assess    Lab Results: Basic Metabolic Panel: Recent Labs  Lab 10/06/21 0729 10/06/21 2106 10/07/21 0428 10/07/21 1750 10/08/21 0321 10/08/21 0726 10/08/21 1219 10/08/21 2001 10/09/21 0429 10/09/21 1407 10/09/21 1835  NA 151*   < > 153*   < > 150*   < > 148* 147* 144 142 143  K 3.8   < > 3.5   < > 3.5  --   --  3.7 3.4* 3.5 3.7  CL 120*   < > 121*   < > 118*  --   --  116* 113* 113* 115*  CO2 28   < > 25   < > 25  --   --  27 25 24 24   GLUCOSE 139*   < > 119*   < > 137*  --   --  121* 117* 107* 89  BUN 29*   < > 25*   < > 22  --   --  22 18 18 19   CREATININE 0.95   < > 0.98   < > 0.98  --   --  0.87 0.73 0.68 0.73  CALCIUM 7.8*   < > 7.9*   < > 7.7*  --   --  7.7* 7.6* 7.6* 7.5*  MG 2.5*  --  2.7*  --  2.3  --   --   --  2.3  --  2.4  PHOS 2.3*  --  2.7  --  2.7  --   --   --  2.5  --  3.0   < > = values in this interval not displayed.    CBC: Recent Labs  Lab 10/04/21 1548 10/05/21 2110 10/08/21 0321 10/08/21 1219 10/09/21 0429 10/09/21 1835 10/10/21 0412  WBC 10.3   < > 19.2* 19.6* 21.2* 17.4* 17.2*  NEUTROABS 8.6*  --   --  17.5*  --  15.5*  --   HGB 16.2   < > 14.1 12.6* 12.5* 12.0* 11.6*  HCT 51.5   < > 43.8 41.2 39.0 38.5* 36.5*  MCV 94.7   < > 94.0 96.9 94.7 95.8 93.4  PLT 359   < > 259 287 298 292 315   < > = values in this interval not displayed.    Cardiac Enzymes: No results for input(s): CKTOTAL, CKMB, CKMBINDEX,  TROPONINI in the last 168 hours.  Lipid Panel: No results for input(s): CHOL, TRIG, HDL, CHOLHDL, VLDL, LDLCALC in the last 168 hours.  Imaging: CT HEAD WO CONTRAST (5MM)  Result Date: 10/08/2021 CLINICAL DATA:  Delirium.  Sepsis.  COVID positive. EXAM: CT HEAD WITHOUT CONTRAST TECHNIQUE: Contiguous axial images were obtained from the base of the skull through the vertex without intravenous contrast. COMPARISON:  CT head without contrast 10/04/2021 FINDINGS: Brain: Moderate atrophy and white matter changes are stable. No acute infarct, hemorrhage, or mass lesion is present. Remote lacunar infarcts are present in the thalami bilaterally. White matter changes extend into the brainstem. Cerebellum is unremarkable. The ventricles are proportionate to the degree of atrophy. No significant extraaxial fluid collection is present. Vascular: Atherosclerotic changes are present within the cavernous internal carotid arteries. No hyperdense vessel is present. Skull: Calvarium is intact. No focal lytic or blastic lesions are present. No significant extracranial soft tissue lesion is present. Sinuses/Orbits: The paranasal sinuses and mastoid air cells are clear. NG tube enters on the right. Bilateral lens replacements are noted. Globes and orbits are otherwise unremarkable. IMPRESSION: 1. Stable atrophy and white matter disease. This likely reflects the sequela of chronic microvascular ischemia. 2. No acute intracranial abnormality or significant interval change. Electronically Signed   By: San Morelle M.D.   On: 10/08/2021 14:04   MR BRAIN WO CONTRAST  Result Date: 10/09/2021 CLINICAL DATA:  Initial evaluation for acute delirium. EXAM: MRI HEAD WITHOUT CONTRAST TECHNIQUE: Multiplanar, multiecho pulse sequences of the brain and surrounding structures were obtained without intravenous contrast. COMPARISON:  Comparison made with prior head CT from 10/08/2021. FINDINGS: Brain: Cerebral volume within normal  limits for age. Patchy T2/FLAIR hyperintensity involving the periventricular and deep white matter both cerebral hemispheres as well as the pons, most consistent with chronic small vessel ischemic disease, moderate in nature. No abnormal foci of restricted diffusion to suggest acute or subacute ischemia. Gray-white matter differentiation maintained. No encephalomalacia to suggest chronic cortical infarction. No evidence for acute or chronic intracranial hemorrhage. No mass lesion, midline shift or mass effect. No hydrocephalus or extra-axial fluid collection. Pituitary gland suprasellar region normal. Midline structures intact. Vascular: Major intracranial vascular flow voids are maintained. Skull and upper cervical spine: Craniocervical junction within normal limits. Bone marrow signal intensity grossly normal. No scalp soft tissue abnormality. Sinuses/Orbits: Patient status post bilateral ocular lens replacement. Globes and orbital soft tissues demonstrate no acute finding. Mild scattered mucosal thickening noted within the ethmoidal air cells. Paranasal sinuses are otherwise clear. No mastoid effusion. Other: None. IMPRESSION: 1. No acute intracranial abnormality. 2. Moderate chronic microvascular ischemic disease. Electronically Signed   By: Pincus Badder.D.  On: 10/09/2021 20:40   CT CHEST ABDOMEN PELVIS W CONTRAST  Result Date: 10/08/2021 CLINICAL DATA:  88 year old male with history of sepsis. COVID positive patient. EXAM: CT CHEST, ABDOMEN, AND PELVIS WITH CONTRAST TECHNIQUE: Multidetector CT imaging of the chest, abdomen and pelvis was performed following the standard protocol during bolus administration of intravenous contrast. CONTRAST:  38mL OMNIPAQUE IOHEXOL 350 MG/ML SOLN COMPARISON:  Chest CT 10/05/2021. FINDINGS: CT CHEST FINDINGS Cardiovascular: Heart size is normal. There is no significant pericardial fluid, thickening or pericardial calcification. There is aortic atherosclerosis, as  well as atherosclerosis of the great vessels of the mediastinum and the coronary arteries, including calcified atherosclerotic plaque in the left anterior descending and right coronary arteries. Mediastinum/Nodes: No pathologically enlarged mediastinal or hilar lymph nodes. Nasogastric tube extending into the stomach. Esophagus is otherwise unremarkable in appearance. Postoperative changes of thyroidectomy. No axillary lymphadenopathy. Lungs/Pleura: There is a combination of atelectasis and airspace consolidation in the left lower lobe. Some dependent subsegmental atelectasis is also noted in the right lower lobe. Trace left pleural fluid. No right pleural effusion. No definite suspicious appearing pulmonary nodules or masses are noted. Musculoskeletal: There are no aggressive appearing lytic or blastic lesions noted in the visualized portions of the skeleton. CT ABDOMEN PELVIS FINDINGS Hepatobiliary: No suspicious cystic or solid hepatic lesions. No intra or extrahepatic biliary ductal dilatation. Gallbladder is normal in appearance. Pancreas: No pancreatic mass. No pancreatic ductal dilatation. No pancreatic or peripancreatic fluid collections or inflammatory changes. Spleen: Unremarkable. Adrenals/Urinary Tract: Low-attenuation lesions in the lower pole of the right kidney compatible with simple cysts. The largest of these is exophytic measuring 6.7 x 6.0 cm. Left kidney and right adrenal gland are normal in appearance. Heterogeneously enhancing mass in the left adrenal gland measuring 3.3 x 2.8 cm. No hydroureteronephrosis. In the right-side of the urinary bladder (axial image 111 of series 3) there is a 6 x 2 mm (mean diameter of 4 mm) calculus. Urinary bladder is otherwise unremarkable in appearance. Stomach/Bowel: Nasogastric tube coiled upon itself in the stomach, with the tip in the proximal stomach. Stomach is otherwise unremarkable in appearance. No pathologic dilatation of small bowel or colon. A few  scattered colonic diverticulae are noted, particularly in the proximal sigmoid colon, without surrounding inflammatory changes to suggest an acute diverticulitis at this time. Normal appendix. Vascular/Lymphatic: Aortic atherosclerosis. No lymphadenopathy noted in the abdomen or pelvis. Reproductive: Prostate gland and seminal vesicles are unremarkable in appearance. Other: Small right inguinal hernia containing a short segment of mid to distal small bowel. No significant volume of ascites. No pneumoperitoneum. Musculoskeletal: Superior endplate compression fracture of L2 with approximately 30% loss of central vertebral body height. There are no aggressive appearing lytic or blastic lesions noted in the visualized portions of the skeleton. IMPRESSION: 1. Left lower lobe pneumonia. 2. No acute findings are noted in the abdomen or pelvis. 3. Heterogeneously enhancing left adrenal mass measuring 3.3 x 2.8 cm. This is incompletely characterized and could represent a benign or malignant lesion. Further evaluation with adrenal protocol CT scan should be considered. 4. Colonic diverticulosis without evidence of acute diverticulitis at this time. 5. Aortic atherosclerosis, in addition to 2 vessel coronary artery disease. 6. Additional incidental findings, as above. Electronically Signed   By: Vinnie Langton M.D.   On: 10/08/2021 14:40   DG CHEST PORT 1 VIEW  Result Date: 10/09/2021 CLINICAL DATA:  Shortness of breath. EXAM: PORTABLE CHEST 1 VIEW COMPARISON:  October 04, 2021. FINDINGS: Stable cardiomediastinal silhouette. Nasogastric tube  is seen entering stomach. Right lung is clear. New left lower lobe opacity is noted concerning for pneumonia or atelectasis with associated effusion. Bony thorax is unremarkable. IMPRESSION: New left lower lobe opacity is noted concerning for pneumonia or atelectasis with associated pleural effusion. Electronically Signed   By: Marijo Conception M.D.   On: 10/09/2021 13:55      Assessment: 88 year old male with AMS in the setting of Covid infection and dementia. 1. Exam reveals a lethargic patient who will attempt to answer questions with hypophonic, unintelligible monosyllabic vocalizations, will not open eyes or follow other commands, but will swat at examiner equally with BUE to arm pinch and sternal rub. Nuchal rigidity is noted.  2. CT head:  Stable atrophy and white matter disease. This likely reflects the sequela of chronic microvascular ischemia. No acute intracranial abnormality or significant interval change. 3. MRI brain:  No acute intracranial abnormality. Moderate chronic microvascular ischemic disease. 4. CXR from Sunday: New left lower ,lobe opacity is noted concerning for pneumonia or atelectasis with associated pleural effusion. 5. Ammonia level is normal. Transaminases normal. Calcium level corrected for low albumin is normal. BUN and Cr normal. Phosphorus normal. Mg normal. TSH level normal. Vitamin B12 normal. CBC with leukocytosis at 17.2.  6. Most likely etiology for the patient's AMS is a combination of Covid encephalopathy and hospital delirium in the setting of underlying dementia. Meningitis is on the DDx. Hypercarbic or hypoxic encephalopathy is also possible.   Recommendations: 1. ABG 2. EEG in AM (ordered) 3. Currently on vancomycin. Adding meningitis-dose ampicillin, ceftriaxone and acyclovir. Appreciate Pharmacy assistance.  4. Hospitalist to order fluoro-guided LP in the AM.  5. Avoid medications with sedating or deliriogenic properties.  6. Ordering a STAT thiamine level. After it has been drawn, start the patient on high dose empiric thiamine at 500 mg IV TID x 3 days, then 100 mg po qd thereafter.     Electronically signed: Dr. Kerney Elbe 10/10/2021, 6:29 AM

## 2021-10-10 NOTE — Progress Notes (Signed)
Pharmacy Antibiotic Note - Follow Up Consult  Ian Weber is a 88 y.o. male admitted on 10/04/2021. Pt currently on vancomycin for PNA, just completed remdesivir course for COVID PNA. Antibiotic being broadened to acyclovir, ceftriaxone, ampicillin, vancomycin for possible meningitis.   Today, 10/10/21 WBC remains elevated SCr WNL  Plan: Continue ampicillin 2 g IV q4h, ceftriaxone 2 g IV q12h Continue acyclovir 10 mg/kg IV q8h. Order NS @ 125 mL/hr  Increase vancomycin to 1000 mg IV q12h to target VT 15-20 mcg/mL Monitor renal function, culture data  Height: 6' (182.9 cm) Weight: 72.6 kg (160 lb) IBW/kg (Calculated) : 77.6  Temp (24hrs), Avg:98.5 F (36.9 C), Min:98.1 F (36.7 C), Max:98.9 F (37.2 C)  Recent Labs  Lab 10/04/21 1641 10/04/21 2020 10/05/21 0359 10/06/21 1007 10/06/21 2106 10/08/21 0321 10/08/21 1219 10/08/21 2001 10/09/21 0429 10/09/21 1407 10/09/21 1835 10/10/21 0412  WBC  --   --    < >  --    < > 19.2* 19.6*  --  21.2*  --  17.4* 17.2*  CREATININE  --   --    < >  --    < > 0.98  --  0.87 0.73 0.68 0.73  --   LATICACIDVEN 2.0* 2.1*  --  1.7  --   --  1.9  --   --   --  1.2  --    < > = values in this interval not displayed.     Estimated Creatinine Clearance: 69.3 mL/min (by C-G formula based on SCr of 0.73 mg/dL).    Allergies  Allergen Reactions   Amlodipine Other (See Comments)    Reaction not recalled by patient   Chlorthalidone Other (See Comments)    Reaction not recalled by patient   Codeine Phosphate Other (See Comments)    Weakness, sweating   Hydrochlorothiazide W-Triamterene Other (See Comments)    Reaction not recalled by patient   Prednisone Itching   Sulfamethoxazole Itching   Triamterene Other (See Comments)    Reaction not recalled by patient    Thank you for allowing pharmacy to be a part of this patients care.  Lenis Noon, PharmD 10/10/2021 12:30 PM

## 2021-10-11 ENCOUNTER — Inpatient Hospital Stay (HOSPITAL_COMMUNITY): Payer: Medicare HMO

## 2021-10-11 DIAGNOSIS — R4182 Altered mental status, unspecified: Secondary | ICD-10-CM | POA: Diagnosis not present

## 2021-10-11 LAB — GLUCOSE, CAPILLARY
Glucose-Capillary: 104 mg/dL — ABNORMAL HIGH (ref 70–99)
Glucose-Capillary: 105 mg/dL — ABNORMAL HIGH (ref 70–99)
Glucose-Capillary: 113 mg/dL — ABNORMAL HIGH (ref 70–99)
Glucose-Capillary: 151 mg/dL — ABNORMAL HIGH (ref 70–99)
Glucose-Capillary: 75 mg/dL (ref 70–99)
Glucose-Capillary: 93 mg/dL (ref 70–99)
Glucose-Capillary: 94 mg/dL (ref 70–99)
Glucose-Capillary: 98 mg/dL (ref 70–99)

## 2021-10-11 LAB — MAGNESIUM
Magnesium: 1.9 mg/dL (ref 1.7–2.4)
Magnesium: 2 mg/dL (ref 1.7–2.4)

## 2021-10-11 LAB — COMPREHENSIVE METABOLIC PANEL
ALT: 17 U/L (ref 0–44)
AST: 21 U/L (ref 15–41)
Albumin: 1.9 g/dL — ABNORMAL LOW (ref 3.5–5.0)
Alkaline Phosphatase: 52 U/L (ref 38–126)
Anion gap: 4 — ABNORMAL LOW (ref 5–15)
BUN: 11 mg/dL (ref 8–23)
CO2: 24 mmol/L (ref 22–32)
Calcium: 7.1 mg/dL — ABNORMAL LOW (ref 8.9–10.3)
Chloride: 114 mmol/L — ABNORMAL HIGH (ref 98–111)
Creatinine, Ser: 0.69 mg/dL (ref 0.61–1.24)
GFR, Estimated: >60 mL/min (ref >60)
Glucose, Bld: 108 mg/dL — ABNORMAL HIGH (ref 70–99)
Potassium: 3.1 mmol/L — ABNORMAL LOW (ref 3.5–5.1)
Sodium: 142 mmol/L (ref 135–145)
Total Bilirubin: 0.6 mg/dL (ref 0.3–1.2)
Total Protein: 5.1 g/dL — ABNORMAL LOW (ref 6.5–8.1)

## 2021-10-11 LAB — CBC WITH DIFFERENTIAL/PLATELET
Abs Immature Granulocytes: 0.1 10*3/uL — ABNORMAL HIGH (ref 0.00–0.07)
Basophils Absolute: 0 10*3/uL (ref 0.0–0.1)
Basophils Relative: 0 %
Eosinophils Absolute: 0.1 10*3/uL (ref 0.0–0.5)
Eosinophils Relative: 1 %
HCT: 32.9 % — ABNORMAL LOW (ref 39.0–52.0)
Hemoglobin: 10.7 g/dL — ABNORMAL LOW (ref 13.0–17.0)
Immature Granulocytes: 1 %
Lymphocytes Relative: 6 %
Lymphs Abs: 0.8 10*3/uL (ref 0.7–4.0)
MCH: 30.1 pg (ref 26.0–34.0)
MCHC: 32.5 g/dL (ref 30.0–36.0)
MCV: 92.4 fL (ref 80.0–100.0)
Monocytes Absolute: 0.6 10*3/uL (ref 0.1–1.0)
Monocytes Relative: 4 %
Neutro Abs: 11.9 10*3/uL — ABNORMAL HIGH (ref 1.7–7.7)
Neutrophils Relative %: 88 %
Platelets: 266 10*3/uL (ref 150–400)
RBC: 3.56 MIL/uL — ABNORMAL LOW (ref 4.22–5.81)
RDW: 13.2 % (ref 11.5–15.5)
WBC: 13.4 10*3/uL — ABNORMAL HIGH (ref 4.0–10.5)
nRBC: 0 % (ref 0.0–0.2)

## 2021-10-11 LAB — PHOSPHORUS
Phosphorus: 2.2 mg/dL — ABNORMAL LOW (ref 2.5–4.6)
Phosphorus: 3.1 mg/dL (ref 2.5–4.6)

## 2021-10-11 LAB — PROCALCITONIN: Procalcitonin: 0.23 ng/mL

## 2021-10-11 LAB — LACTIC ACID, PLASMA: Lactic Acid, Venous: 0.9 mmol/L (ref 0.5–1.9)

## 2021-10-11 MED ORDER — CALCIUM CARBONATE ANTACID 1250 MG/5ML PO SUSP
500.0000 mg | Freq: Three times a day (TID) | ORAL | Status: AC
  Administered 2021-10-11 (×3): 500 mg
  Filled 2021-10-11 (×3): qty 5

## 2021-10-11 MED ORDER — HEPARIN (PORCINE) 25000 UT/250ML-% IV SOLN
1700.0000 [IU]/h | INTRAVENOUS | Status: AC
  Administered 2021-10-11: 21:00:00 1500 [IU]/h via INTRAVENOUS
  Administered 2021-10-12: 1650 [IU]/h via INTRAVENOUS
  Administered 2021-10-13: 1700 [IU]/h via INTRAVENOUS
  Filled 2021-10-11 (×3): qty 250

## 2021-10-11 MED ORDER — CHLORHEXIDINE GLUCONATE 0.12 % MT SOLN
15.0000 mL | Freq: Two times a day (BID) | OROMUCOSAL | Status: DC
  Administered 2021-10-11 – 2021-10-22 (×20): 15 mL via OROMUCOSAL
  Filled 2021-10-11 (×17): qty 15

## 2021-10-11 MED ORDER — SODIUM PHOSPHATES 45 MMOLE/15ML IV SOLN
30.0000 mmol | Freq: Once | INTRAVENOUS | Status: AC
  Administered 2021-10-11: 30 mmol via INTRAVENOUS
  Filled 2021-10-11: qty 10

## 2021-10-11 MED ORDER — CALCIUM GLUCONATE 500 MG PO TABS: 1.0000 | ORAL_TABLET | Freq: Three times a day (TID) | ORAL | Status: DC

## 2021-10-11 MED ORDER — POTASSIUM CHLORIDE 20 MEQ PO PACK
40.0000 meq | PACK | Freq: Three times a day (TID) | ORAL | Status: AC
  Administered 2021-10-11 (×3): 40 meq
  Filled 2021-10-11 (×3): qty 2

## 2021-10-11 MED ORDER — ACETAMINOPHEN 650 MG RE SUPP
650.0000 mg | Freq: Once | RECTAL | Status: AC | PRN
  Administered 2021-10-11: 650 mg via RECTAL

## 2021-10-11 MED ORDER — ORAL CARE MOUTH RINSE
15.0000 mL | Freq: Two times a day (BID) | OROMUCOSAL | Status: DC
  Administered 2021-10-11 – 2021-10-22 (×20): 15 mL via OROMUCOSAL

## 2021-10-11 MED ORDER — FREE WATER
200.0000 mL | Freq: Three times a day (TID) | Status: DC
  Administered 2021-10-11 – 2021-10-20 (×27): 200 mL

## 2021-10-11 NOTE — Plan of Care (Signed)
  Problem: Safety: Goal: Ability to remain free from injury will improve Outcome: Progressing   

## 2021-10-11 NOTE — Progress Notes (Signed)
PROGRESS NOTE  Ian Weber PXT:[id number] DOB: 01-30-1934 DOA: 10/04/2021 PCP: Jilda Panda, MD  HPI/Recap of past 24 hours: [name] is a 88 y.o. male with medical history significant for dementia, hypertension, hypothyroidism, history of GI bleed who presents from home with concerns of increasing altered mental status and generalized weakness.  Associated with poor appetite and poor oral intake.  Was brought to the ED for further evaluation.  Recent COVID-19 positive 09/28/21.  Started on antiviral IV remdesivir.  Hospital course complicated by findings of B/L pulmonary embolism, started hep drip 10/05/21. Minimal responsiveness, restless agitation delirium reported early morning 12/30.  Heparin drip switched to full dose subcu Lovenox on 10/09/2021.  10/09/2021: Patient was seen and examined at bedside.  Minimally responsive.  Sputum culture returned positive for staph aureus and Streptococcus pneumoniae sensitive to IV vancomycin, resistant to erythromycin.    Updated his wife at bedside.  No oral intake for several days.  She wants to hold off on feeding tube.  Poor prognosis, palliative care team consulted and following.    10/10/21: Patient was seen and examined at his bedside.  Minimally responsive.  MRI brain negative for any acute intracranial abnormalities.  No seizure or epileptiform discharges were seen throughout the recording.  10/11/2021: Seen at bedside with his wife present in the room.  Minimally responsive.  Plan for LP today  Assessment/Plan: Principal Problem:   AMS (altered mental status) Active Problems:   Pressure injury of skin   COVID-19 virus infection   Elevated troponin   Dementia without behavioral disturbance (HCC)   HTN (hypertension)   Persistent acute metabolic encephalopathy, multifactorial secondary to COVID-19 viral infection and hypernatremia from poor oral intake, dehydration.  Covid 19+ 09/28/21 CT head nonacute, CT abdomen and pelvis with  contrast nonacute. NG tube placement for hydration on 10/07/2021.  Continue free water flushing to avoid dehydration. No oral intake for several days, updated his wife at bedside.  She declines tube feeding on 10/09/2021.  After discussing with PCCM, family agreed to tube feeding, started 10/09/21 osmolite 1.2 cal trickle feeds.  Dietitian consulted. Ammonia, B12, TSH, MRI brain, Trop, all nonrevealing.  Blood cx x 2 peripherally, in process. Rule out vitamin D insufficiency and meningitis Follow thiamine level, follow lumbar puncture results Plan LP on 10/11/2021, hold off Lovenox and n.p.o. after midnight (hold off tube feedings after midnight)   COVID-19 viral infection with Staph aureus and Streptococcus pneumonia coinfection, POA Sputum culture obtained on 10/06/2021, returned positive for staph aureus and Streptococcus pneumoniae sensitive to IV vancomycin, resistant to erythromycin.   Continue IV vancomycin  Newly diagnosed left lower lobe Staph aureus and Streptococcus community-acquired pneumonia, POA Management as stated above Monitor fever curve and WBC  Newly diagnosed bilateral acute pulmonary embolism Started on heparin drip on 10/05/2021, switched to subcu Lovenox on 10/09/2021. No evidence of right heart strain on CT scan.  Left pleural effusion in setting of pneumonia Hold off IV fluid hydration to avoid worsening of left pleural effusion Continue IV antibiotics  Resolved hypovolemic hypernatremia in the setting of poor oral intake Free water flushes 200 cc every 6 hours. Hold off IV fluid hydration to avoid worsening left pleural effusion.  Acute hypoxic respiratory failure secondary to above Continue to maintain oxygen saturation greater than 92% Hypoxemia seen on ABG 10/06/2021  Elevated troponin in the setting of covid 19 infection and bilateral pulmonary embolism Trop S peaked at >4300 and down trended Continue to monitor on telemetry  Prolonged  QTC QTC greater than  500 on 12 EKG done on 10/04/2021 Unable to obtain 12 EKG as the patient would not cooperate. Continue to optimize magnesium and potassium levels Continue to avoid QTC prolonging agents  Covid-19 viral infection, POA IV antiviral  Resolved post repletion: Hypokalemia K+ 3.2> 3.8 Continue to replete electrolytes as indicated.  Dementia Continue fall/aspiration/delirium precautions  Goals of care Palliative care team consulted to assist with establishing goals of care Ongoing discussion of goals of care respiratory treatment family.  DNR as of 10/07/2021  Pressure Injury 10/04/21 Buttocks Bilateral Stage 2 -  Partial thickness loss of dermis presenting as a shallow open injury with a red, pink wound bed without slough. (Active) 10/04/21 2300 Location: Buttocks Location Orientation: Bilateral Staging: Stage 2 -  Partial thickness loss of dermis presenting as a shallow open injury with a red, pink wound bed without slough. Wound Description (Comments): Present on Admission:Yes    Code Status: DNR  Family Communication: Updated his wife at bedside.  Disposition Plan: Poor prognosis.   Consultants: Palliative care team  Procedures: 2D echo   Antimicrobials: Antiviral Remdesivir   DVT prophylaxis:  Hep drip  Status is: Inpatient  Patient requires at least 2 midnights for further evaluation and treatment of present conditions.        Objective: Vitals:   10/11/21 0523 10/11/21 0806 10/11/21 1330 10/11/21 1700  BP: 121/65  129/77   Pulse: 77  77   Resp: (!) 25  (!) 27   Temp: (!) 101.3 F (38.5 C)  99.4 F (37.4 C)   TempSrc: Rectal  Rectal   SpO2: 99% 97% 99% 97%  Weight:      Height:        Intake/Output Summary (Last 24 hours) at 10/11/2021 1900 Last data filed at 10/11/2021 1711 Gross per 24 hour  Intake 3202.73 ml  Output 1250 ml  Net 1952.73 ml   Filed Weights   10/04/21 1538 10/10/21 2100 10/11/21 0445  Weight: 72.6 kg 73.6 kg 74.8 kg     Exam:  General: 88 y.o. year-old male frail-appearing no acute distress.  He is minimally responsive.  Responds to painful stimuli. Cardiovascular: Regular Rate and rhythm no rubs or gallops. Respiratory: Mild rales diffusely. Poor inspiratory efforts.  Abdomen: Soft non tender non distended with normal bowel sounds.   Musculoskeletal: No lower extremity edema. Skin: No ulcerative lesions. Psychiatry: Hyper somnolent, unable to assess.   Neuro: Minimally responsive   Data Reviewed: CBC: Recent Labs  Lab 10/08/21 1219 10/09/21 0429 10/09/21 1835 10/10/21 0412 10/11/21 0641  WBC 19.6* 21.2* 17.4* 17.2* 13.4*  NEUTROABS 17.5*  --  15.5*  --  11.9*  HGB 12.6* 12.5* 12.0* 11.6* 10.7*  HCT 41.2 39.0 38.5* 36.5* 32.9*  MCV 96.9 94.7 95.8 93.4 92.4  PLT 287 298 292 315 024   Basic Metabolic Panel: Recent Labs  Lab 10/08/21 2001 10/09/21 0429 10/09/21 1407 10/09/21 1835 10/10/21 1319 10/10/21 1705 10/11/21 0457 10/11/21 0641 10/11/21 1648  NA 147* 144 142 143  --   --   --  142  --   K 3.7 3.4* 3.5 3.7  --   --   --  3.1*  --   CL 116* 113* 113* 115*  --   --   --  114*  --   CO2 27 25 24 24   --   --   --  24  --   GLUCOSE 121* 117* 107* 89  --   --   --  108*  --   BUN 22 18 18 19   --   --   --  11  --   CREATININE 0.87 0.73 0.68 0.73  --   --   --  0.69  --   CALCIUM 7.7* 7.6* 7.6* 7.5*  --   --   --  7.1*  --   MG  --  2.3  --  2.4 2.2 2.0 1.9  --  2.0  PHOS  --  2.5  --  3.0 2.3* 2.2* 2.2*  --  3.1   GFR: Estimated Creatinine Clearance: 71.4 mL/min (by C-G formula based on SCr of 0.69 mg/dL). Liver Function Tests: Recent Labs  Lab 10/06/21 0729 10/08/21 0321 10/09/21 1835 10/11/21 0641  AST 21 18 19 21   ALT 16 15 15 17   ALKPHOS 58 56 58 52  BILITOT 0.8 0.8 1.0 0.6  PROT 6.3* 6.1* 5.6* 5.1*  ALBUMIN 2.6* 2.4* 2.3* 1.9*   No results for input(s): LIPASE, AMYLASE in the last 168 hours. Recent Labs  Lab 10/09/21 1835  AMMONIA 18   Coagulation  Profile: No results for input(s): INR, PROTIME in the last 168 hours. Cardiac Enzymes: No results for input(s): CKTOTAL, CKMB, CKMBINDEX, TROPONINI in the last 168 hours. BNP (last 3 results) No results for input(s): PROBNP in the last 8760 hours. HbA1C: No results for input(s): HGBA1C in the last 72 hours. CBG: Recent Labs  Lab 10/11/21 0443 10/11/21 0751 10/11/21 1126 10/11/21 1141 10/11/21 1700  GLUCAP 98 94 104* 75 93   Lipid Profile: No results for input(s): CHOL, HDL, LDLCALC, TRIG, CHOLHDL, LDLDIRECT in the last 72 hours. Thyroid Function Tests: Recent Labs    10/09/21 1835  TSH 2.714    Anemia Panel: Recent Labs    10/09/21 1835  VITAMINB12 504   Urine analysis:    Component Value Date/Time   COLORURINE YELLOW 10/05/2021 0631   APPEARANCEUR HAZY (A) 10/05/2021 0631   LABSPEC 1.028 10/05/2021 0631   PHURINE 5.0 10/05/2021 0631   GLUCOSEU NEGATIVE 10/05/2021 0631   HGBUR SMALL (A) 10/05/2021 0631   BILIRUBINUR NEGATIVE 10/05/2021 0631   KETONESUR 5 (A) 10/05/2021 0631   PROTEINUR NEGATIVE 10/05/2021 0631   UROBILINOGEN 0.2 11/19/2012 1138   NITRITE NEGATIVE 10/05/2021 0631   LEUKOCYTESUR NEGATIVE 10/05/2021 0631   Sepsis Labs: @LABRCNTIP (procalcitonin:4,lacticidven:4)  ) Recent Results (from the past 240 hour(s))  Culture, Respiratory w Gram Stain     Status: None   Collection Time: 10/06/21  2:03 PM   Specimen: Tracheal Aspirate  Result Value Ref Range Status   Specimen Description   Final    TRACHEAL ASPIRATE Performed at [facility], [place] [address] [place], [place] [address]    Special Requests   Final    NONE Performed at [facility], [place] [address] [place], [place] [address]    Gram Stain   Final    FEW SQUAMOUS EPITHELIAL CELLS PRESENT ABUNDANT WBC PRESENT,BOTH PMN AND MONONUCLEAR ABUNDANT GRAM POSITIVE COCCI FEW GRAM POSITIVE RODS FEW GRAM NEGATIVE RODS Performed at [facility],  [place] [address], [place], [place] [address]    Culture   Final    ABUNDANT STAPHYLOCOCCUS AUREUS MODERATE STREPTOCOCCUS PNEUMONIAE    Report Status 10/09/2021 FINAL  Final   Organism ID, Bacteria STAPHYLOCOCCUS AUREUS  Final   Organism ID, Bacteria STREPTOCOCCUS PNEUMONIAE  Final      Susceptibility   Staphylococcus aureus - MIC*    CIPROFLOXACIN <=0.5 SENSITIVE Sensitive  ERYTHROMYCIN <=0.25 SENSITIVE Sensitive     GENTAMICIN <=0.5 SENSITIVE Sensitive     OXACILLIN 1 SENSITIVE Sensitive     TETRACYCLINE <=1 SENSITIVE Sensitive     VANCOMYCIN <=0.5 SENSITIVE Sensitive     TRIMETH/SULFA <=10 SENSITIVE Sensitive     CLINDAMYCIN <=0.25 SENSITIVE Sensitive     RIFAMPIN <=0.5 SENSITIVE Sensitive     Inducible Clindamycin NEGATIVE Sensitive     * ABUNDANT STAPHYLOCOCCUS AUREUS   Streptococcus pneumoniae - MIC*    ERYTHROMYCIN RESISTANT Resistant     LEVOFLOXACIN 1 SENSITIVE Sensitive     VANCOMYCIN 0.5 SENSITIVE Sensitive     PENICILLIN (meningitis) <=0.06 SENSITIVE Sensitive     PENO - penicillin <=0.06      PENICILLIN (non-meningitis) <=0.06 SENSITIVE Sensitive     PENICILLIN (oral) <=0.06 SENSITIVE Sensitive     CEFTRIAXONE (non-meningitis) <=0.12 SENSITIVE Sensitive     CEFTRIAXONE (meningitis) <=0.12 SENSITIVE Sensitive     * MODERATE STREPTOCOCCUS PNEUMONIAE  Culture, blood (routine x 2)     Status: None (Preliminary result)   Collection Time: 10/09/21  6:35 PM   Specimen: BLOOD  Result Value Ref Range Status   Specimen Description   Final    BLOOD LEFT ANTECUBITAL Performed at [place] [address] [place], [place] [address]    Special Requests   Final    BOTTLES DRAWN AEROBIC ONLY Blood Culture results may not be optimal due to an inadequate volume of blood received in culture bottles Performed at [facility] [address] [place], [place] [address]    Culture   Final    NO GROWTH 1 DAY Performed at [place], [place] [address] [place], [place] [address]    Report Status PENDING  Incomplete  Culture, blood (routine x 2)     Status: None (Preliminary result)   Collection Time: 10/09/21  6:35 PM   Specimen: BLOOD  Result Value Ref Range Status   Specimen Description   Final    BLOOD LEFT HAND Performed at [facility] [address], [place], [place] [address]    Special Requests   Final    BOTTLES DRAWN AEROBIC ONLY Blood Culture adequate volume Performed at [facility] [address] [place], [place] [address]    Culture   Final    NO GROWTH 1 DAY Performed at [facility], [place] [address] [place], [place] [address]    Report Status PENDING  Incomplete      Studies: DG CHEST PORT 1 VIEW  Result Date: 10/11/2021 CLINICAL DATA:  88 year old male with abnormal pulmonary auscultation. Recent shortness of breath, sepsis, pneumonia. EXAM: PORTABLE CHEST 1 VIEW COMPARISON:  Portable chest 10/09/2021 and earlier. FINDINGS: Portable AP semi upright view at 1022 hours. Enteric tube remains in place and appears looped in the stomach. Stable thyroid surgical clips at the thoracic inlet. Continued dense left lung base and now increasing right lower lung peribronchial opacity. Mediastinal contours remain within normal limits. No superimposed pneumothorax or pulmonary edema. No definite pleural effusion. Negative visible bowel gas. No acute osseous abnormality identified. IMPRESSION: 1. Continued left lower lobe consolidation and increasing right lower lung peribronchial opacity suspicious for progressive bilateral pneumonia. 2. Enteric tube in the stomach. Electronically Signed   By: Genevie Ann M.D.   On: 10/11/2021 10:39   DG FLUORO GUIDE LUMBAR PUNCTURE  Result Date: 10/11/2021 CLINICAL DATA:  Mental status changes.  COVID-19 positive. EXAM: DIAGNOSTIC  LUMBAR PUNCTURE UNDER FLUOROSCOPIC GUIDANCE COMPARISON:  Brain MR 10/09/2021 FLUOROSCOPY TIME:  Fluoroscopy Time:   4 minutes and 18 seconds Radiation Exposure Index (if provided by the fluoroscopic device): 50.2 mGy Number of Acquired Spot Images: 0 PROCEDURE: Informed consent was obtained from the patient's wife prior to the procedure, including potential complications of headache, allergy, and pain. With the patient prone, the lower back was prepped with Betadine. 1% Lidocaine was used for local anesthesia. Lumbar puncture was initially attempted at the L3-4 level. Despite appropriate/optimal needle placement, no CSF could be returned. The second attempt was performed with the patient at 10 degrees incline. Subsequently, the L2-3 level was localized. Two more attempts were made with a new needle at this level. Despite good position of the needle, CSF could not be obtained. The patient tolerated the procedure well and there were no apparent complications. IMPRESSION: Unsuccessful lumbar puncture, despite a total of 4 attempts at 2 different levels. Despite good needle positioning, CSF did not return on any placement. Suspect this is secondary to low intracranial pressure in this patient who was sleeping throughout the procedure. Electronically Signed   By: Abigail Miyamoto M.D.   On: 10/11/2021 16:36    Scheduled Meds:  aspirin  81 mg Oral Daily   calcium carbonate (dosed in mg elemental calcium)  500 mg of elemental calcium Per Tube TID   chlorhexidine  15 mL Mouth Rinse BID   feeding supplement (PROSource TF)  45 mL Per Tube TID   free water  200 mL Per Tube Q8H   levothyroxine  100 mcg Oral Q0600   liver oil-zinc oxide   Topical TID   mouth rinse  15 mL Mouth Rinse q12n4p   potassium chloride  40 mEq Per Tube TID    Continuous Infusions:  sodium chloride 50 mL/hr at 10/11/21 1014   acyclovir 725 mg (10/11/21 1552)   ampicillin (OMNIPEN) IV 2 g (10/11/21 1554)   cefTRIAXone (ROCEPHIN)  IV 2 g (10/11/21 1125)   feeding supplement (OSMOLITE 1.5 CAL) 1,000 mL (10/11/21 1640)   thiamine injection 500 mg (10/11/21  1637)   vancomycin 1,000 mg (10/11/21 1328)     LOS: 7 days     Kayleen Memos, MD [facility] Pager [phone]  If 7PM-7AM, please contact night-coverage [email or web address] Password [email or web address] 10/11/2021, 7:00 PM

## 2021-10-11 NOTE — Progress Notes (Addendum)
[facility] for IV heparin Indication: acute pulmonary embolus  Allergies  Allergen Reactions   Amlodipine Other (See Comments)    Reaction not recalled by patient   Chlorthalidone Other (See Comments)    Reaction not recalled by patient   Codeine Phosphate Other (See Comments)    Weakness, sweating   Hydrochlorothiazide W-Triamterene Other (See Comments)    Reaction not recalled by patient   Prednisone Itching   Sulfamethoxazole Itching   Triamterene Other (See Comments)    Reaction not recalled by patient    Patient Measurements: Height: 6' (182.9 cm) Weight: 74.8 kg (164 lb 14.5 oz) IBW/kg (Calculated) : 77.6 Heparin Dosing Weight: actual body weight   Vital Signs: Temp: 99.4 F (37.4 C) (01/03 1330) Temp Source: Rectal (01/03 1330) BP: 129/77 (01/03 1330) Pulse Rate: 77 (01/03 1330)  Labs: Recent Labs    10/09/21 0429 10/09/21 1407 10/09/21 1835 10/10/21 0412 10/11/21 0641  HGB 12.5*  --  12.0* 11.6* 10.7*  HCT 39.0  --  38.5* 36.5* 32.9*  PLT 298  --  292 315 266  HEPARINUNFRC 0.25* 0.61  --   --   --   CREATININE 0.73 0.68 0.73  --  0.69  TROPONINIHS  --   --  45*  --   --      Estimated Creatinine Clearance: 71.4 mL/min (by C-G formula based on SCr of 0.69 mg/dL).   Assessment: Patient is an 88 y.o M who was recently diagnosed with COVID-19 presented to the ED on 10/04/21 with AMS. Troponin was found to be elevated, but cardiology does not think clinical picture is consistent with ACS.  D-dimer on 10/05/21 was found to be >20 and chest CTA came back positive for bilateral PE. Pharmacy initially consulted for IV heparin, then transitioned to SQ enoxaparin on 10/09/21. Enoxaparin held on 10/10/21 for planned LP on 10/11/21. Per notes, LP unsuccessful today despite total of 4 attempts. Now received consult to resume anticoagulation with IV heparin. Confirmed with Dr. [place] Crane okay to resume anticoagulation now (per MD, she discussed  with Dr.Yamagata with IR who said safe to resume =/> 2 hours after procedure).   Today, 10/11/2021: - Hgb low/decreased to 10.7. Pltc WNL - No bleeding documented  Goal of Therapy:  Heparin level 0.3-0.7 units/ml Monitor platelets by anticoagulation protocol: Yes   Plan:  - Resume IV heparin at previously therapeutic rate of 1500 units/hr - Heparin level 8 hours after initiation  - Daily CBC, heparin level  - Monitor closely for s/sx bleeding - F/u plans for any further procedures   Lindell Spar, PharmD, BCPS Clinical Pharmacist  10/11/2021 8:05 PM

## 2021-10-11 NOTE — Progress Notes (Signed)
SLP Cancellation Note  Patient Details Name: Ian Weber MRN: [id number] DOB: 01-15-1934   Cancelled treatment:       Reason Eval/Treat Not Completed: Other (comment) (pt remains responsive to pain - suspect secretion aspiration, RN reports pt continues with wet breathing quality, note code changed to DNR and palliative seeing pt, will continue efforts once more.)  Kathleen Lime, MS [facility] SLP [place] Office [phone] Pager [phone]   Macario Golds 10/11/2021, 9:01 AM

## 2021-10-11 NOTE — Plan of Care (Signed)
Patient to undergo LP today with IR. Neuro will f/u tomorrow when results are available.   Su Monks, MD [facility] [phone]  If 7pm- 7am, please page neurology on call as listed in [place].

## 2021-10-12 DIAGNOSIS — R4182 Altered mental status, unspecified: Secondary | ICD-10-CM | POA: Diagnosis not present

## 2021-10-12 DIAGNOSIS — U071 COVID-19: Secondary | ICD-10-CM | POA: Diagnosis not present

## 2021-10-12 DIAGNOSIS — F0390 Unspecified dementia without behavioral disturbance: Secondary | ICD-10-CM | POA: Diagnosis not present

## 2021-10-12 DIAGNOSIS — G9340 Encephalopathy, unspecified: Secondary | ICD-10-CM | POA: Diagnosis not present

## 2021-10-12 LAB — CBC WITH DIFFERENTIAL/PLATELET
Abs Immature Granulocytes: 0.15 10*3/uL — ABNORMAL HIGH (ref 0.00–0.07)
Basophils Absolute: 0 10*3/uL (ref 0.0–0.1)
Basophils Relative: 0 %
Eosinophils Absolute: 0.1 10*3/uL (ref 0.0–0.5)
Eosinophils Relative: 1 %
HCT: 35.9 % — ABNORMAL LOW (ref 39.0–52.0)
Hemoglobin: 11.2 g/dL — ABNORMAL LOW (ref 13.0–17.0)
Immature Granulocytes: 1 %
Lymphocytes Relative: 7 %
Lymphs Abs: 1 10*3/uL (ref 0.7–4.0)
MCH: 29.8 pg (ref 26.0–34.0)
MCHC: 31.2 g/dL (ref 30.0–36.0)
MCV: 95.5 fL (ref 80.0–100.0)
Monocytes Absolute: 0.6 10*3/uL (ref 0.1–1.0)
Monocytes Relative: 5 %
Neutro Abs: 11.9 10*3/uL — ABNORMAL HIGH (ref 1.7–7.7)
Neutrophils Relative %: 86 %
Platelets: 301 10*3/uL (ref 150–400)
RBC: 3.76 MIL/uL — ABNORMAL LOW (ref 4.22–5.81)
RDW: 13.1 % (ref 11.5–15.5)
WBC: 13.8 10*3/uL — ABNORMAL HIGH (ref 4.0–10.5)
nRBC: 0 % (ref 0.0–0.2)

## 2021-10-12 LAB — COMPREHENSIVE METABOLIC PANEL
ALT: 17 U/L (ref 0–44)
AST: 22 U/L (ref 15–41)
Albumin: 1.7 g/dL — ABNORMAL LOW (ref 3.5–5.0)
Alkaline Phosphatase: 48 U/L (ref 38–126)
Anion gap: 7 (ref 5–15)
BUN: 11 mg/dL (ref 8–23)
CO2: 23 mmol/L (ref 22–32)
Calcium: 7.1 mg/dL — ABNORMAL LOW (ref 8.9–10.3)
Chloride: 113 mmol/L — ABNORMAL HIGH (ref 98–111)
Creatinine, Ser: 0.49 mg/dL — ABNORMAL LOW (ref 0.61–1.24)
GFR, Estimated: >60 mL/min (ref >60)
Glucose, Bld: 129 mg/dL — ABNORMAL HIGH (ref 70–99)
Potassium: 4 mmol/L (ref 3.5–5.1)
Sodium: 143 mmol/L (ref 135–145)
Total Bilirubin: 0.4 mg/dL (ref 0.3–1.2)
Total Protein: 5.2 g/dL — ABNORMAL LOW (ref 6.5–8.1)

## 2021-10-12 LAB — GLUCOSE, CAPILLARY
Glucose-Capillary: 118 mg/dL — ABNORMAL HIGH (ref 70–99)
Glucose-Capillary: 125 mg/dL — ABNORMAL HIGH (ref 70–99)
Glucose-Capillary: 126 mg/dL — ABNORMAL HIGH (ref 70–99)
Glucose-Capillary: 134 mg/dL — ABNORMAL HIGH (ref 70–99)
Glucose-Capillary: 134 mg/dL — ABNORMAL HIGH (ref 70–99)
Glucose-Capillary: 136 mg/dL — ABNORMAL HIGH (ref 70–99)

## 2021-10-12 LAB — HEPARIN LEVEL (UNFRACTIONATED)
Heparin Unfractionated: 0.15 IU/mL — ABNORMAL LOW (ref 0.30–0.70)
Heparin Unfractionated: 0.29 IU/mL — ABNORMAL LOW (ref 0.30–0.70)

## 2021-10-12 LAB — MAGNESIUM: Magnesium: 1.9 mg/dL (ref 1.7–2.4)

## 2021-10-12 LAB — D-DIMER, QUANTITATIVE: D-Dimer, Quant: 1.87 ug/mL-FEU — ABNORMAL HIGH (ref 0.00–0.50)

## 2021-10-12 LAB — PHOSPHORUS: Phosphorus: 1.8 mg/dL — ABNORMAL LOW (ref 2.5–4.6)

## 2021-10-12 NOTE — Progress Notes (Signed)
Neurology Progress Note  Brief HPI: 88 y.o. with PMHx of dementia, HTN, hypothyroidism, sacral decubitus ulcer, FTT, and GIB who presented to [facility] 12/27 with worsened mentation (baseline ADLs performed by patient's wife, alert but not oriented). Patient was found to be COVID + with decreased appetite and labored respirations. Initial work up revealed patient with leukocytosis with WBC count of 17.2 and bilateral PE's with consolidation in the left lower lobe related to infarct and/or pneumonia. Examination revealed patient with nuchal rigidity and patient was started on meningitis coverage. Fluoro-guided LP attempted X 4 10/11/2021 without success.   Subjective: Unsuccessful LP attempt Patient was started back on his heparin gtt for PE.   Exam: Vitals:   10/11/21 2022 10/12/21 0417  BP: 117/75 (!) 143/83  Pulse: 90 72  Resp: 17 19  Temp: 99.7 F (37.6 C) 99.2 F (37.3 C)  SpO2: 98% 96%   Gen: Laying in hospital bed in no acute distress Resp: patient with cough and congestion, though patient refused suctioning Abd: soft, non-distended  Neuro: Mental Status: Drowsy, wakes to tactile stimulation.  On arousal, patient becomes agitated.  Most of his speech is unintelligible with hypophonic speech and congestion though examiner is able to make out patient stating "I am tired" and "leave me alone".  He does not follow commands and is not redirectable. Patient becomes aggressive with examiner and begins to swing his arms with any attempt at examination aborting further evaluation.  He does not name objects, answer orientation questions, or follow most commands.  He does wiggle his toes on bilateral lower extremities when his wife asks him to.  Cranial Nerves: EOMI, face appears symmetric resting and with grimace, hearing is intact to voice, does not protrude tongue to command Motor: Swings bilateral arms violently with any attempt at examination without asymmetry.  Wiggles toes on bilateral  feet with repeat instruction from his wife.  Sensory: Unable to assess due to patient's agitation and lack of cooperation.  Gait: Deferred  Pertinent Labs: CBC    Component Value Date/Time   WBC 13.8 (H) 10/12/2021 0453   RBC 3.76 (L) 10/12/2021 0453   HGB 11.2 (L) 10/12/2021 0453   HCT 35.9 (L) 10/12/2021 0453   PLT 301 10/12/2021 0453   MCV 95.5 10/12/2021 0453   MCH 29.8 10/12/2021 0453   MCHC 31.2 10/12/2021 0453   RDW 13.1 10/12/2021 0453   LYMPHSABS 1.0 10/12/2021 0453   MONOABS 0.6 10/12/2021 0453   EOSABS 0.1 10/12/2021 0453   BASOSABS 0.0 10/12/2021 0453   CMP     Component Value Date/Time   NA 143 10/12/2021 0453   K 4.0 10/12/2021 0453   CL 113 (H) 10/12/2021 0453   CO2 23 10/12/2021 0453   GLUCOSE 129 (H) 10/12/2021 0453   BUN 11 10/12/2021 0453   CREATININE 0.49 (L) 10/12/2021 0453   CALCIUM 7.1 (L) 10/12/2021 0453   PROT 5.2 (L) 10/12/2021 0453   ALBUMIN 1.7 (L) 10/12/2021 0453   AST 22 10/12/2021 0453   ALT 17 10/12/2021 0453   ALKPHOS 48 10/12/2021 0453   BILITOT 0.4 10/12/2021 0453   GFRNONAA >60 10/12/2021 0453   GFRAA >60 10/04/2017 1400   Results for orders placed or performed during the hospital encounter of 10/04/21  Culture, Respiratory w Gram Stain     Status: None   Collection Time: 10/06/21  2:03 PM   Specimen: Tracheal Aspirate  Result Value Ref Range Status   Specimen Description   Final    TRACHEAL  ASPIRATE Performed at [facility], [place] [address] [place], [place] [address]    Special Requests   Final    NONE Performed at [facility], [place] [address], [place], [place] [address]    Gram Stain   Final    FEW SQUAMOUS EPITHELIAL CELLS PRESENT ABUNDANT WBC PRESENT,BOTH PMN AND MONONUCLEAR ABUNDANT GRAM POSITIVE COCCI FEW GRAM POSITIVE RODS FEW GRAM NEGATIVE RODS Performed at [facility], [place] [address] [place], [place] [address]    Culture   Final    ABUNDANT STAPHYLOCOCCUS  AUREUS MODERATE STREPTOCOCCUS PNEUMONIAE    Report Status 10/09/2021 FINAL  Final   Organism ID, Bacteria STAPHYLOCOCCUS AUREUS  Final   Organism ID, Bacteria STREPTOCOCCUS PNEUMONIAE  Final      Susceptibility   Staphylococcus aureus - MIC*    CIPROFLOXACIN <=0.5 SENSITIVE Sensitive     ERYTHROMYCIN <=0.25 SENSITIVE Sensitive     GENTAMICIN <=0.5 SENSITIVE Sensitive     OXACILLIN 1 SENSITIVE Sensitive     TETRACYCLINE <=1 SENSITIVE Sensitive     VANCOMYCIN <=0.5 SENSITIVE Sensitive     TRIMETH/SULFA <=10 SENSITIVE Sensitive     CLINDAMYCIN <=0.25 SENSITIVE Sensitive     RIFAMPIN <=0.5 SENSITIVE Sensitive     Inducible Clindamycin NEGATIVE Sensitive     * ABUNDANT STAPHYLOCOCCUS AUREUS   Streptococcus pneumoniae - MIC*    ERYTHROMYCIN RESISTANT Resistant     LEVOFLOXACIN 1 SENSITIVE Sensitive     VANCOMYCIN 0.5 SENSITIVE Sensitive     PENICILLIN (meningitis) <=0.06 SENSITIVE Sensitive     PENO - penicillin <=0.06      PENICILLIN (non-meningitis) <=0.06 SENSITIVE Sensitive     PENICILLIN (oral) <=0.06 SENSITIVE Sensitive     CEFTRIAXONE (non-meningitis) <=0.12 SENSITIVE Sensitive     CEFTRIAXONE (meningitis) <=0.12 SENSITIVE Sensitive     * MODERATE STREPTOCOCCUS PNEUMONIAE  Culture, blood (routine x 2)     Status: None (Preliminary result)   Collection Time: 10/09/21  6:35 PM   Specimen: BLOOD  Result Value Ref Range Status   Specimen Description   Final    BLOOD LEFT ANTECUBITAL Performed at [facility], [place] [address], [place], [place] [address]    Special Requests   Final    BOTTLES DRAWN AEROBIC ONLY Blood Culture results may not be optimal due to an inadequate volume of blood received in culture bottles Performed at [place] [address], [place], [place] [address]    Culture   Final    NO GROWTH 2 DAYS Performed at [place] [address] [place], [place] [address]    Report Status PENDING  Incomplete   Culture, blood (routine x 2)     Status: None (Preliminary result)   Collection Time: 10/09/21  6:35 PM   Specimen: BLOOD  Result Value Ref Range Status   Specimen Description   Final    BLOOD LEFT HAND Performed at [place] [address] [place], [place] [address]    Special Requests   Final    BOTTLES DRAWN AEROBIC ONLY Blood Culture adequate volume Performed at [place] [address], [place], [place] [address]    Culture   Final    NO GROWTH 2 DAYS Performed at [place] [address], [place], [place] [address]    Report Status PENDING  Incomplete   Imaging Reviewed:  CT head wo contrast 12/31: 1. Stable atrophy and white matter  disease. This likely reflects the sequela of chronic microvascular ischemia. 2. No acute intracranial abnormality or significant interval change.  MRI brain wo contrast 10/09/2021: 1. No acute intracranial abnormality. 2. Moderate chronic microvascular ischemic disease.  EEG 1/2: "This study is suggestive of moderate diffuse encephalopathy, nonspecific etiology. No seizures or epileptiform discharges were seen throughout the recording."  Assessment: 88 year old male with AMS in the setting of COVID infection and dementia. 1. Exam reveals a drowsy patient who does not attempt to answer questions. He does become agitated with stimulation and speech continues to be hypophonic with mostly unintelligible monosyllabic vocalizations. Patient does not follow commands and will swats continually at examiner equally with BUE with any stimulation.  2. CT head:  Stable atrophy and white matter disease. This likely reflects the sequela of chronic microvascular ischemia. No acute intracranial abnormality or significant interval change. 3. MRI brain:  No acute intracranial abnormality. Moderate chronic microvascular ischemic disease. 4. CXR from 1/1: New left lower lobe opacity is noted concerning for  pneumonia or atelectasis with associated pleural effusion. 5. Ammonia level is normal. Transaminases normal. Calcium level corrected for low albumin is normal. BUN and Cr normal. Phosphorus normal. Mg normal. TSH level normal. Vitamin B12 normal. CBC with leukocytosis at 17.2 --> 13.8.   6. Most likely etiology for the patient's AMS is a combination of Covid encephalopathy and hospital delirium in the setting of underlying dementia. Meningitis is on the DDx. Hypercarbic or hypoxic encephalopathy is also possible.   7. Meningitis coverage was initiated and fluoro-guided LP attempt was unsuccessful x 4. Patient was placed back on heparin gtt for bilateral PEs.   Recommendations: - Continue meningitis coverage as we are unable to rule out meningitis with CSF studies - Avoid medications with sedating or deliriogenic properties.  - Continue Thiamine replacement with 500 mg IV TID for a total of 3 days, then 100 mg PO daily after - Continue management of comorbid conditions per primary team  Anibal Henderson, AGACNP-BC [facility] [phone]  Neurology Attending Attestation   I examined the patient and discussed plan with Ms. Toberman NP. Above note has been edited by me to reflect my findings and recommendations. Most likely etiology for the patient's AMS is a combination of Covid encephalopathy and hospital delirium in the setting of underlying dementia. Primary team may wish to reach out to ID to see if they would feel comfortable stopping CNS empiric coverage given the fact that we are unable to obtain CSF to r/o meningitis (IR failed x4). Other than that, recommend continued observation for improvement as he recovers from acute covid infection. If he does not make meaningful improvements it would be beneficial to readdress GOC. No further specific neurologic workup or tx recommended at this time. Neurology to sign off, but please re-engage if additional neurologic concerns  arise.  Su Monks, MD [facility] [phone]   If 7pm- 7am, please page neurology on call as listed in [place].

## 2021-10-12 NOTE — Progress Notes (Signed)
PROGRESS NOTE  [name]:[id number] DOB: May 29, 1934 DOA: 10/04/2021 PCP: Jilda Panda, MD  HPI/Recap of past 24 hours: Ian Weber is a 88 y.o. male with medical history significant for dementia, hypertension, hypothyroidism, history of GI bleed who presents from home with concerns of increasing altered mental status and generalized weakness.  Associated with poor appetite and poor oral intake.  Was brought to the ED for further evaluation.  Recent COVID-19 positive 09/28/21.  Started on antiviral IV remdesivir.  Hospital course complicated by findings of B/L pulmonary embolism, started hep drip 10/05/21. Minimal responsiveness, restless agitation delirium reported early morning 12/30.    10/12/21: Has his eyes open, mumbles and speech is incoherent.  His wife reports that he did ask for water earlier.  Assessment/Plan: Principal Problem:   AMS (altered mental status) Active Problems:   Pressure injury of skin   COVID-19 virus infection   Elevated troponin   Dementia without behavioral disturbance (HCC)   HTN (hypertension)   Persistent acute metabolic encephalopathy, multifactorial secondary to COVID-19 viral infection and hypernatremia from poor oral intake, dehydration.  Covid 19+ 09/28/21 CT head nonacute, CT abdomen and pelvis with contrast nonacute. NG tube placement for hydration on 10/07/2021.  Continue free water flushing to avoid dehydration. No oral intake for several days, updated his wife at bedside.  She declines tube feeding on 10/09/2021.  After discussing with PCCM, family agreed to tube feeding, started 10/09/21 osmolite 1.2 cal trickle feeds.  Dietitian consulted. Ammonia, B12, TSH, MRI brain, Trop, all nonrevealing.  Blood cx x 2 peripherally, in process. Thiamine level in process, he has been receiving high-dose IV thiamine Patient seen by neurology EEG did not show any epileptiform discharges Lumbar puncture was attempted by radiology on 1/3, but was  unsuccessful after 4 attempts Suspicion is that his encephalopathy is related to COVID in the setting of dementia.  May also have a component of hospital delirium. His wife currently feels that his mental status is mildly better today She wishes to continue with current treatments and monitor for further improvement. I expressed that if he does not make meaningful improvements, would need to be addressed goals of care and continued tube feeding.   COVID-19 viral infection with Staph aureus and Streptococcus pneumonia coinfection, POA Sputum culture obtained on 10/06/2021, returned positive for staph aureus and Streptococcus pneumoniae sensitive to IV vancomycin, resistant to erythromycin.   Continue IV vancomycin  Newly diagnosed left lower lobe Staph aureus and Streptococcus community-acquired pneumonia, POA Management as stated above Monitor fever curve and WBC  Newly diagnosed bilateral acute pulmonary embolism Started on heparin drip on 10/05/2021 No evidence of right heart strain on CT scan.  Left pleural effusion in setting of pneumonia Hold off IV fluid hydration to avoid worsening of left pleural effusion Continue IV antibiotics  Resolved hypovolemic hypernatremia in the setting of poor oral intake Free water flushes 200 cc every 6 hours. Hold off IV fluid hydration to avoid worsening left pleural effusion.  Acute hypoxic respiratory failure secondary to above Continue to maintain oxygen saturation greater than 92% Hypoxemia seen on ABG 10/06/2021  Elevated troponin in the setting of covid 19 infection and bilateral pulmonary embolism Trop S peaked at >4300 and down trended -Echocardiogram shows ejection fraction of 55 to 60% with no wall motion abnormalities. Continue to monitor on telemetry  Prolonged QTC QTC greater than 500 on 12 EKG done on 10/04/2021 Unable to obtain 12 EKG as the patient would not cooperate. Continue to  optimize magnesium and potassium  levels Continue to avoid QTC prolonging agents  Covid-19 viral infection, POA IV antiviral  Resolved post repletion: Hypokalemia K+ 3.2> 3.8 Continue to replete electrolytes as indicated.  Dementia Continue fall/aspiration/delirium precautions  Goals of care Palliative care team consulted to assist with establishing goals of care Ongoing discussion of goals of care respiratory treatment family.  DNR as of 10/07/2021  Pressure Injury 10/04/21 Buttocks Bilateral Stage 2 -  Partial thickness loss of dermis presenting as a shallow open injury with a red, pink wound bed without slough. (Active) 10/04/21 2300 Location: Buttocks Location Orientation: Bilateral Staging: Stage 2 -  Partial thickness loss of dermis presenting as a shallow open injury with a red, pink wound bed without slough. Wound Description (Comments): Present on Admission:Yes    Code Status: DNR  Family Communication: Updated his wife at bedside.  Disposition Plan: Poor prognosis.   Consultants: Palliative care team  Procedures: 2D echo   Antimicrobials: Antiviral Remdesivir   DVT prophylaxis: Heparin infusion  Status is: Inpatient  Patient requires at least 2 midnights for further evaluation and treatment of present conditions.        Objective: Vitals:   10/11/21 1700 10/11/21 2022 10/12/21 0417 10/12/21 2113  BP:  117/75 (!) 143/83 124/70  Pulse:  90 72 68  Resp:  17 19 19   Temp:  99.7 F (37.6 C) 99.2 F (37.3 C) 98 F (36.7 C)  TempSrc:  Axillary Axillary Axillary  SpO2: 97% 98% 96% 98%  Weight:      Height:        Intake/Output Summary (Last 24 hours) at 10/12/2021 2154 Last data filed at 10/12/2021 1616 Gross per 24 hour  Intake 3225.3 ml  Output 800 ml  Net 2425.3 ml   Filed Weights   10/04/21 1538 10/10/21 2100 10/11/21 0445  Weight: 72.6 kg 73.6 kg 74.8 kg    Exam:  General exam: Somnolent, wakes up to voice, NG tube in place Respiratory system: Bilateral rhonchi.  Respiratory effort normal. Cardiovascular system:RRR. No murmurs, rubs, gallops. Gastrointestinal system: Abdomen is nondistended, soft and nontender. No organomegaly or masses felt. Normal bowel sounds heard. Central nervous system: Limited exam due to mental status Extremities: No C/C/E, +pedal pulses Skin: No rashes, lesions or ulcers Psychiatry: Speech is incoherent    Data Reviewed: CBC: Recent Labs  Lab 10/08/21 1219 10/09/21 0429 10/09/21 1835 10/10/21 0412 10/11/21 0641 10/12/21 0453  WBC 19.6* 21.2* 17.4* 17.2* 13.4* 13.8*  NEUTROABS 17.5*  --  15.5*  --  11.9* 11.9*  HGB 12.6* 12.5* 12.0* 11.6* 10.7* 11.2*  HCT 41.2 39.0 38.5* 36.5* 32.9* 35.9*  MCV 96.9 94.7 95.8 93.4 92.4 95.5  PLT 287 298 292 315 266 595   Basic Metabolic Panel: Recent Labs  Lab 10/09/21 0429 10/09/21 1407 10/09/21 1835 10/10/21 1319 10/10/21 1705 10/11/21 0457 10/11/21 0641 10/11/21 1648 10/12/21 0453  NA 144 142 143  --   --   --  142  --  143  K 3.4* 3.5 3.7  --   --   --  3.1*  --  4.0  CL 113* 113* 115*  --   --   --  114*  --  113*  CO2 25 24 24   --   --   --  24  --  23  GLUCOSE 117* 107* 89  --   --   --  108*  --  129*  BUN 18 18 19   --   --   --  11  --  11  CREATININE 0.73 0.68 0.73  --   --   --  0.69  --  0.49*  CALCIUM 7.6* 7.6* 7.5*  --   --   --  7.1*  --  7.1*  MG 2.3  --  2.4 2.2 2.0 1.9  --  2.0 1.9  PHOS 2.5  --  3.0 2.3* 2.2* 2.2*  --  3.1 1.8*   GFR: Estimated Creatinine Clearance: 71.4 mL/min (A) (by C-G formula based on SCr of 0.49 mg/dL (L)). Liver Function Tests: Recent Labs  Lab 10/06/21 0729 10/08/21 0321 10/09/21 1835 10/11/21 0641 10/12/21 0453  AST 21 18 19 21 22   ALT 16 15 15 17 17   ALKPHOS 58 56 58 52 48  BILITOT 0.8 0.8 1.0 0.6 0.4  PROT 6.3* 6.1* 5.6* 5.1* 5.2*  ALBUMIN 2.6* 2.4* 2.3* 1.9* 1.7*   No results for input(s): LIPASE, AMYLASE in the last 168 hours. Recent Labs  Lab 10/09/21 1835  AMMONIA 18   Coagulation Profile: No  results for input(s): INR, PROTIME in the last 168 hours. Cardiac Enzymes: No results for input(s): CKTOTAL, CKMB, CKMBINDEX, TROPONINI in the last 168 hours. BNP (last 3 results) No results for input(s): PROBNP in the last 8760 hours. HbA1C: No results for input(s): HGBA1C in the last 72 hours. CBG: Recent Labs  Lab 10/12/21 0415 10/12/21 0736 10/12/21 1112 10/12/21 1758 10/12/21 2111  GLUCAP 126* 136* 125* 134* 118*   Lipid Profile: No results for input(s): CHOL, HDL, LDLCALC, TRIG, CHOLHDL, LDLDIRECT in the last 72 hours. Thyroid Function Tests: No results for input(s): TSH, T4TOTAL, FREET4, T3FREE, THYROIDAB in the last 72 hours.   Anemia Panel: No results for input(s): VITAMINB12, FOLATE, FERRITIN, TIBC, IRON, RETICCTPCT in the last 72 hours.  Urine analysis:    Component Value Date/Time   COLORURINE YELLOW 10/05/2021 0631   APPEARANCEUR HAZY (A) 10/05/2021 0631   LABSPEC 1.028 10/05/2021 0631   PHURINE 5.0 10/05/2021 0631   GLUCOSEU NEGATIVE 10/05/2021 0631   HGBUR SMALL (A) 10/05/2021 0631   BILIRUBINUR NEGATIVE 10/05/2021 0631   KETONESUR 5 (A) 10/05/2021 0631   PROTEINUR NEGATIVE 10/05/2021 0631   UROBILINOGEN 0.2 11/19/2012 1138   NITRITE NEGATIVE 10/05/2021 0631   LEUKOCYTESUR NEGATIVE 10/05/2021 0631   Sepsis Labs: @LABRCNTIP (procalcitonin:4,lacticidven:4)  ) Recent Results (from the past 240 hour(s))  Culture, Respiratory w Gram Stain     Status: None   Collection Time: 10/06/21  2:03 PM   Specimen: Tracheal Aspirate  Result Value Ref Range Status   Specimen Description   Final    TRACHEAL ASPIRATE Performed at [facility], [place] [address], [place], [place] [address]    Special Requests   Final    NONE Performed at [facility], [place] [address] [place], [place] [address]    Gram Stain   Final    FEW SQUAMOUS EPITHELIAL CELLS PRESENT ABUNDANT WBC PRESENT,BOTH PMN AND MONONUCLEAR ABUNDANT GRAM POSITIVE  COCCI FEW GRAM POSITIVE RODS FEW GRAM NEGATIVE RODS Performed at [facility], [place] [address], [place],  [address]    Culture   Final    ABUNDANT STAPHYLOCOCCUS AUREUS MODERATE STREPTOCOCCUS PNEUMONIAE    Report Status 10/09/2021 FINAL  Final   Organism ID, Bacteria STAPHYLOCOCCUS AUREUS  Final   Organism ID, Bacteria STREPTOCOCCUS PNEUMONIAE  Final      Susceptibility   Staphylococcus aureus - MIC*    CIPROFLOXACIN <=0.5 SENSITIVE Sensitive     ERYTHROMYCIN <=  0.25 SENSITIVE Sensitive     GENTAMICIN <=0.5 SENSITIVE Sensitive     OXACILLIN 1 SENSITIVE Sensitive     TETRACYCLINE <=1 SENSITIVE Sensitive     VANCOMYCIN <=0.5 SENSITIVE Sensitive     TRIMETH/SULFA <=10 SENSITIVE Sensitive     CLINDAMYCIN <=0.25 SENSITIVE Sensitive     RIFAMPIN <=0.5 SENSITIVE Sensitive     Inducible Clindamycin NEGATIVE Sensitive     * ABUNDANT STAPHYLOCOCCUS AUREUS   Streptococcus pneumoniae - MIC*    ERYTHROMYCIN RESISTANT Resistant     LEVOFLOXACIN 1 SENSITIVE Sensitive     VANCOMYCIN 0.5 SENSITIVE Sensitive     PENICILLIN (meningitis) <=0.06 SENSITIVE Sensitive     PENO - penicillin <=0.06      PENICILLIN (non-meningitis) <=0.06 SENSITIVE Sensitive     PENICILLIN (oral) <=0.06 SENSITIVE Sensitive     CEFTRIAXONE (non-meningitis) <=0.12 SENSITIVE Sensitive     CEFTRIAXONE (meningitis) <=0.12 SENSITIVE Sensitive     * MODERATE STREPTOCOCCUS PNEUMONIAE  Culture, blood (routine x 2)     Status: None (Preliminary result)   Collection Time: 10/09/21  6:35 PM   Specimen: BLOOD  Result Value Ref Range Status   Specimen Description   Final    BLOOD LEFT ANTECUBITAL Performed at [place] [address], [place], [place] [address]    Special Requests   Final    BOTTLES DRAWN AEROBIC ONLY Blood Culture results may not be optimal due to an inadequate volume of blood received in culture bottles Performed at [place] [address]  [place], [place] [address]    Culture   Final    NO GROWTH 2 DAYS Performed at [place] [address], [place], [place] [address]    Report Status PENDING  Incomplete  Culture, blood (routine x 2)     Status: None (Preliminary result)   Collection Time: 10/09/21  6:35 PM   Specimen: BLOOD  Result Value Ref Range Status   Specimen Description   Final    BLOOD LEFT HAND Performed at [place] [address] [place], [place] [address]    Special Requests   Final    BOTTLES DRAWN AEROBIC ONLY Blood Culture adequate volume Performed at [place] [address] [place], [place] [address]    Culture   Final    NO GROWTH 2 DAYS Performed at [place] [address] [place], [place] [address]    Report Status PENDING  Incomplete      Studies: No results found.  Scheduled Meds:  aspirin  81 mg Oral Daily   chlorhexidine  15 mL Mouth Rinse BID   feeding supplement (PROSource TF)  45 mL Per Tube TID   free water  200 mL Per Tube Q8H   levothyroxine  100 mcg Oral Q0600   liver oil-zinc oxide   Topical TID   mouth rinse  15 mL Mouth Rinse q12n4p    Continuous Infusions:  sodium chloride 50 mL/hr at 10/11/21 1014   acyclovir 165 mL/hr at 10/12/21 1616   ampicillin (OMNIPEN) IV 2 g (10/12/21 2050)   cefTRIAXone (ROCEPHIN)  IV Stopped (10/12/21 1052)   feeding supplement (OSMOLITE 1.5 CAL) 1,000 mL (10/12/21 1926)   heparin 1,700 Units/hr (10/12/21 1750)   thiamine injection 500 mg (10/12/21 1704)   vancomycin 1,000 mg (10/12/21 1417)     LOS: 8 days     Kathie Dike, MD [facility]   If 7PM-7AM, please  contact night-coverage [email or web address]  10/12/2021, 9:54 PM

## 2021-10-12 NOTE — Progress Notes (Signed)
[place] for IV heparin Indication: acute pulmonary embolus  Allergies  Allergen Reactions   Amlodipine Other (See Comments)    Reaction not recalled by patient   Chlorthalidone Other (See Comments)    Reaction not recalled by patient   Codeine Phosphate Other (See Comments)    Weakness, sweating   Hydrochlorothiazide W-Triamterene Other (See Comments)    Reaction not recalled by patient   Prednisone Itching   Sulfamethoxazole Itching   Triamterene Other (See Comments)    Reaction not recalled by patient    Patient Measurements: Height: 6' (182.9 cm) Weight: 74.8 kg (164 lb 14.5 oz) IBW/kg (Calculated) : 77.6 Heparin Dosing Weight: actual body weight   Vital Signs: Temp: 99.2 F (37.3 C) (01/04 0417) Temp Source: Axillary (01/04 0417) BP: 143/83 (01/04 0417) Pulse Rate: 72 (01/04 0417)  Labs: Recent Labs    10/09/21 1407 10/09/21 1407 10/09/21 1835 10/10/21 0412 10/11/21 0641 10/12/21 0453  HGB  --    < > 12.0* 11.6* 10.7* 11.2*  HCT  --    < > 38.5* 36.5* 32.9* 35.9*  PLT  --    < > 292 315 266 301  HEPARINUNFRC 0.61  --   --   --   --  0.15*  CREATININE 0.68  --  0.73  --  0.69 0.49*  TROPONINIHS  --   --  45*  --   --   --    < > = values in this interval not displayed.     Estimated Creatinine Clearance: 71.4 mL/min (A) (by C-G formula based on SCr of 0.49 mg/dL (L)).   Assessment: Patient is an 88 y.o M who was recently diagnosed with COVID-19 presented to the ED on 10/04/21 with AMS. Troponin was found to be elevated, but cardiology does not think clinical picture is consistent with ACS.  D-dimer on 10/05/21 was found to be >20 and chest CTA came back positive for bilateral PE. Pharmacy initially consulted for IV heparin, then transitioned to SQ enoxaparin on 10/09/21. Enoxaparin held on 10/10/21 for planned LP on 10/11/21. Per notes, LP unsuccessful today despite total of 4 attempts. Now received consult to resume  anticoagulation with IV heparin. Confirmed with Dr. [place] Crane okay to resume anticoagulation now (per MD, she discussed with Dr.Yamagata with IR who said safe to resume =/> 2 hours after procedure).   Today, 10/12/2021: - Initial heparin level 0.15- subtherapeutic on IV heparin 1500 units/hr - Hgb low/improved to 11.2. Pltc WNL - No bleeding documented  Goal of Therapy:  Heparin level 0.3-0.7 units/ml Monitor platelets by anticoagulation protocol: Yes   Plan:  - Increase IV heparin to 1650 units/hr - Heparin level 8 hours after rate change - Daily CBC, heparin level  - Monitor closely for s/sx bleeding - F/u plans for any further procedures   Netta Cedars, PharmD, BCPS Clinical Pharmacist  10/12/2021 6:16 AM

## 2021-10-12 NOTE — Progress Notes (Signed)
[place] for IV heparin Indication: acute pulmonary embolus  Allergies  Allergen Reactions   Amlodipine Other (See Comments)    Reaction not recalled by patient   Chlorthalidone Other (See Comments)    Reaction not recalled by patient   Codeine Phosphate Other (See Comments)    Weakness, sweating   Hydrochlorothiazide W-Triamterene Other (See Comments)    Reaction not recalled by patient   Prednisone Itching   Sulfamethoxazole Itching   Triamterene Other (See Comments)    Reaction not recalled by patient    Patient Measurements: Height: 6' (182.9 cm) Weight: 74.8 kg (164 lb 14.5 oz) IBW/kg (Calculated) : 77.6 Heparin Dosing Weight: actual body weight   Vital Signs:    Labs: Recent Labs    10/09/21 1835 10/10/21 0412 10/11/21 0641 10/12/21 0453 10/12/21 1534  HGB 12.0* 11.6* 10.7* 11.2*  --   HCT 38.5* 36.5* 32.9* 35.9*  --   PLT 292 315 266 301  --   HEPARINUNFRC  --   --   --  0.15* 0.29*  CREATININE 0.73  --  0.69 0.49*  --   TROPONINIHS 45*  --   --   --   --      Estimated Creatinine Clearance: 71.4 mL/min (A) (by C-G formula based on SCr of 0.49 mg/dL (L)).   Assessment: Patient is an 88 y.o M who was recently diagnosed with COVID-19 presented to the ED on 10/04/21 with AMS. Troponin was found to be elevated, but cardiology does not think clinical picture is consistent with ACS.  D-dimer on 10/05/21 was found to be >20 and chest CTA came back positive for bilateral PE. Pharmacy initially consulted for IV heparin, then transitioned to SQ enoxaparin on 10/09/21. Enoxaparin held on 10/10/21 for planned LP on 10/11/21. Per notes, LP unsuccessful today despite total of 4 attempts. Now received consult to resume anticoagulation with IV heparin. Confirmed with Dr. [place] Crane okay to resume anticoagulation now (per MD, she discussed with Dr.Yamagata with IR who said safe to resume =/> 2 hours after procedure).   Today, 10/12/2021: - Most  recent heparin level subtherapeutic but improved on 1650 units/hr - No new labs since AM - No bleeding or infusion issues per RN  Goal of Therapy:  Heparin level 0.3-0.7 units/ml Monitor platelets by anticoagulation protocol: Yes   Plan:  - Increase IV heparin to 1700 units/hr - small adjustment as I suspect levels will continue to trend up as heparin accumulates - Heparin level 8 hours after rate change - Daily CBC, heparin level  - Monitor closely for s/sx bleeding - F/u plans for any further procedures   Reuel Boom, PharmD, BCPS [phone] 10/12/2021, 6:15 PM

## 2021-10-13 DIAGNOSIS — U071 COVID-19: Secondary | ICD-10-CM | POA: Diagnosis not present

## 2021-10-13 DIAGNOSIS — F0390 Unspecified dementia without behavioral disturbance: Secondary | ICD-10-CM | POA: Diagnosis not present

## 2021-10-13 DIAGNOSIS — G9340 Encephalopathy, unspecified: Secondary | ICD-10-CM | POA: Diagnosis not present

## 2021-10-13 DIAGNOSIS — R4182 Altered mental status, unspecified: Secondary | ICD-10-CM | POA: Diagnosis not present

## 2021-10-13 LAB — COMPREHENSIVE METABOLIC PANEL
ALT: 22 U/L (ref 0–44)
AST: 32 U/L (ref 15–41)
Albumin: 1.8 g/dL — ABNORMAL LOW (ref 3.5–5.0)
Alkaline Phosphatase: 53 U/L (ref 38–126)
Anion gap: 5 (ref 5–15)
BUN: 10 mg/dL (ref 8–23)
CO2: 25 mmol/L (ref 22–32)
Calcium: 7 mg/dL — ABNORMAL LOW (ref 8.9–10.3)
Chloride: 112 mmol/L — ABNORMAL HIGH (ref 98–111)
Creatinine, Ser: 0.61 mg/dL (ref 0.61–1.24)
GFR, Estimated: >60 mL/min (ref >60)
Glucose, Bld: 123 mg/dL — ABNORMAL HIGH (ref 70–99)
Potassium: 3.8 mmol/L (ref 3.5–5.1)
Sodium: 142 mmol/L (ref 135–145)
Total Bilirubin: 0.4 mg/dL (ref 0.3–1.2)
Total Protein: 5.2 g/dL — ABNORMAL LOW (ref 6.5–8.1)

## 2021-10-13 LAB — CBC WITH DIFFERENTIAL/PLATELET
Abs Immature Granulocytes: 0.14 10*3/uL — ABNORMAL HIGH (ref 0.00–0.07)
Basophils Absolute: 0 10*3/uL (ref 0.0–0.1)
Basophils Relative: 0 %
Eosinophils Absolute: 0.1 10*3/uL (ref 0.0–0.5)
Eosinophils Relative: 1 %
HCT: 34.5 % — ABNORMAL LOW (ref 39.0–52.0)
Hemoglobin: 11 g/dL — ABNORMAL LOW (ref 13.0–17.0)
Immature Granulocytes: 1 %
Lymphocytes Relative: 9 %
Lymphs Abs: 1.1 10*3/uL (ref 0.7–4.0)
MCH: 30.6 pg (ref 26.0–34.0)
MCHC: 31.9 g/dL (ref 30.0–36.0)
MCV: 95.8 fL (ref 80.0–100.0)
Monocytes Absolute: 0.5 10*3/uL (ref 0.1–1.0)
Monocytes Relative: 4 %
Neutro Abs: 10.4 10*3/uL — ABNORMAL HIGH (ref 1.7–7.7)
Neutrophils Relative %: 85 %
Platelets: 339 10*3/uL (ref 150–400)
RBC: 3.6 MIL/uL — ABNORMAL LOW (ref 4.22–5.81)
RDW: 13.1 % (ref 11.5–15.5)
WBC: 12.3 10*3/uL — ABNORMAL HIGH (ref 4.0–10.5)
nRBC: 0 % (ref 0.0–0.2)

## 2021-10-13 LAB — GLUCOSE, CAPILLARY
Glucose-Capillary: 110 mg/dL — ABNORMAL HIGH (ref 70–99)
Glucose-Capillary: 125 mg/dL — ABNORMAL HIGH (ref 70–99)
Glucose-Capillary: 109 mg/dL — ABNORMAL HIGH (ref 70–99)

## 2021-10-13 LAB — HEPARIN LEVEL (UNFRACTIONATED): Heparin Unfractionated: 0.39 IU/mL (ref 0.30–0.70)

## 2021-10-13 MED ORDER — LOPERAMIDE HCL 2 MG PO CAPS
2.0000 mg | ORAL_CAPSULE | Freq: Once | ORAL | Status: AC
  Administered 2021-10-13: 2 mg via ORAL
  Filled 2021-10-13: qty 1

## 2021-10-13 MED ORDER — ZINC OXIDE 40 % EX OINT
1.0000 "application " | TOPICAL_OINTMENT | CUTANEOUS | Status: DC | PRN
  Administered 2021-10-15 – 2021-10-17 (×3): 1 via TOPICAL
  Filled 2021-10-13 (×3): qty 57

## 2021-10-13 MED ORDER — ENOXAPARIN SODIUM 80 MG/0.8ML IJ SOSY
1.0000 mg/kg | PREFILLED_SYRINGE | Freq: Two times a day (BID) | INTRAMUSCULAR | Status: DC
  Administered 2021-10-13 – 2021-10-22 (×19): 75 mg via SUBCUTANEOUS
  Filled 2021-10-13 (×18): qty 0.8

## 2021-10-13 NOTE — Consult Note (Signed)
[facility] Nurse Consult Note: Reason for Consult: Patent with continuing needs for skin care due to incontinence of urine and stool, also refusal to be turned and repositioned. Previous partial thickness lesion on left buttock is now a full thickness, Stage 3. Wife is present for my assessment and participates in the POC development. Wound type: moisture, specifically irritant contact dermatitis, plus pressure  ICD-10 CM Codes for Irritant Dermatitis  L24A2 - Due to fecal, urinary or dual incontinence  Pressure Injury POA: Yes Measurement:Left buttock Stage 3 PI measures 4cm x 2cm with yellow slough and red, dry tissue that is stained with stool. Other scattered areas of partial thickness tissue loss (ICD plus pressure and shear force injury) at buttocks. Wound bed:As noted above Drainage (amount, consistency, odor) None Periwound: intact, dry Dressing procedure/placement/frequency:  A mattress replacement with low air loss feature is placed today and guidance for turning and repositioning is provided via the Orders. Heels are intact and bilateral Prevalon boots are provided today to reduce rick of pressure injury from lateral rotation of feet. Wife reports that her husband has been seen by podiatry in the past, but there are no wounds currently.  I will continue the Desitin cream to the affected areas (discontinuing the antifungal powder element of the previous POC), but increased the frequency to every 4 hours and PRN post episodes of incontinence. Patient remains rigid and forcibly opposes turning and repositioning, requiring 2-3 people to reposition, but has dementia and wife supports the repositioning for the health of his skin along with the other reasons for T&R.  Patient is on tube feeding which also contribute to fecal incontinence.  [facility] nursing team will not follow, but will remain available to this patient, the nursing and medical teams.  Please re-consult if needed. Thanks, Maudie Flakes,  MSN, RN, [place], Ar[facility]  Pager# [phone]

## 2021-10-13 NOTE — Progress Notes (Signed)
PROGRESS NOTE  Ian Weber VVO:[id number] DOB: Feb 16, 1934 DOA: 10/04/2021 PCP: Jilda Panda, MD  HPI/Recap of past 24 hours: Ian Weber is a 88 y.o. male with medical history significant for dementia, hypertension, hypothyroidism, history of GI bleed who presents from home with concerns of increasing altered mental status and generalized weakness.  Associated with poor appetite and poor oral intake.  Was brought to the ED for further evaluation.  Recent COVID-19 positive 09/28/21.  Started on antiviral IV remdesivir.  Hospital course complicated by findings of B/L pulmonary embolism, started hep drip 10/05/21. Minimal responsiveness, restless agitation delirium reported early morning 12/30.    10/13/21: Sleeping on my arrival, opens eyes, makes eye contact.  Speech is not coherent.  Assessment/Plan: Principal Problem:   AMS (altered mental status) Active Problems:   Pressure injury of skin   COVID-19 virus infection   Elevated troponin   Dementia without behavioral disturbance (HCC)   HTN (hypertension)   Persistent acute metabolic encephalopathy, multifactorial secondary to COVID-19 viral infection and hypernatremia from poor oral intake, dehydration.  Covid 19+ 09/28/21 CT head nonacute, CT abdomen and pelvis with contrast nonacute. NG tube placement for hydration on 10/07/2021.  Continue free water flushing to avoid dehydration. No oral intake for several days, updated his wife at bedside.  She declines tube feeding on 10/09/2021.  After discussing with PCCM, family agreed to tube feeding, started 10/09/21 osmolite 1.2 cal trickle feeds.  Dietitian consulted. Ammonia, B12, TSH, MRI brain, Trop, all nonrevealing.  Blood cx x 2 peripherally, in process. Thiamine level in process, he has been receiving high-dose IV thiamine Patient seen by neurology EEG did not show any epileptiform discharges Lumbar puncture was attempted by radiology on 1/3, but was unsuccessful after 4  attempts Suspicion is that his encephalopathy is related to COVID in the setting of dementia.  May also have a component of hospital delirium. His wife currently feels that his mental status is mildly better today She wishes to continue with current treatments and monitor for further improvement. I expressed that if he does not make meaningful improvements, would need to be addressed goals of care and continued tube feeding. Prior to lumbar puncture times, he was started empirically on vancomycin, ceftriaxone, ampicillin and acyclovir Without CSF data, difficult to determine need for continued therapy Discussed with infectious disease, will continue total of 2 weeks of ceftriaxone 2 g every 12 hours, but will discontinue acyclovir, ampicillin and vancomycin for now and observe.   COVID-19 viral infection with Staph aureus and Streptococcus pneumonia coinfection, POA Sputum culture obtained on 10/06/2021, returned positive for staph aureus and Streptococcus pneumoniae  Discussed with ID and will be treated with ceftriaxone  Newly diagnosed left lower lobe Staph aureus and Streptococcus community-acquired pneumonia, POA Management as stated above Monitor fever curve and WBC  Newly diagnosed bilateral acute pulmonary embolism Started on heparin drip on 10/05/2021 No evidence of right heart strain on CT scan.  Left pleural effusion in setting of pneumonia Hold off IV fluid hydration to avoid worsening of left pleural effusion Continue IV antibiotics  Resolved hypovolemic hypernatremia in the setting of poor oral intake Free water flushes 200 cc every 6 hours. Hold off IV fluid hydration to avoid worsening left pleural effusion.  Acute hypoxic respiratory failure secondary to above Continue to maintain oxygen saturation greater than 92% Hypoxemia seen on ABG 10/06/2021  Elevated troponin in the setting of covid 19 infection and bilateral pulmonary embolism Trop S peaked at >4300 and down  trended -Echocardiogram shows ejection fraction of 55 to 60% with no wall motion abnormalities. Continue to monitor on telemetry  Prolonged QTC QTC greater than 500 on 12 EKG done on 10/04/2021 Unable to obtain 12 EKG as the patient would not cooperate. Continue to optimize magnesium and potassium levels Continue to avoid QTC prolonging agents  Covid-19 viral infection, POA He received a course of remdesivir  Resolved post repletion: Hypokalemia K+ 3.2> 3.8 Continue to replete electrolytes as indicated.  Dementia Continue fall/aspiration/delirium precautions  Goals of care Palliative care team consulted to assist with establishing goals of care Ongoing discussion of goals of care respiratory treatment family.  DNR as of 10/07/2021  Pressure Injury 10/04/21 Buttocks Bilateral Stage 2 -  Partial thickness loss of dermis presenting as a shallow open injury with a red, pink wound bed without slough. (Active) 10/04/21 2300 Location: Buttocks Location Orientation: Bilateral Staging: Stage 2 -  Partial thickness loss of dermis presenting as a shallow open injury with a red, pink wound bed without slough. Wound Description (Comments): Present on Admission:Yes    Code Status: DNR  Family Communication: Updated his wife at bedside.  Disposition Plan: Poor prognosis.   Consultants: Palliative care team Neurology   Procedures: 2D echo   Antimicrobials: Ceftriaxone 1/2 > Vancomycin 1/2 > Ampicillin 1/2 > Acyclovir 1/2 >  DVT prophylaxis: Heparin infusion  Status is: Inpatient  Patient requires at least 2 midnights for further evaluation and treatment of present conditions.        Objective: Vitals:   10/13/21 0123 10/13/21 0130 10/13/21 0423 10/13/21 1345  BP: 116/72  131/89   Pulse: 75  66   Resp: (!) 23 20 19    Temp: 98.4 F (36.9 C)  99.1 F (37.3 C) 97.9 F (36.6 C)  TempSrc: Axillary  Axillary Oral  SpO2: 96%  100%   Weight:      Height:         Intake/Output Summary (Last 24 hours) at 10/13/2021 2123 Last data filed at 10/13/2021 1807 Gross per 24 hour  Intake 2730.02 ml  Output 1301 ml  Net 1429.02 ml   Filed Weights   10/04/21 1538 10/10/21 2100 10/11/21 0445  Weight: 72.6 kg 73.6 kg 74.8 kg    Exam:  General exam: wakes up to voice, cannot comprehend speech Respiratory system: bilateral rhonchi. Respiratory effort normal. Cardiovascular system:RRR. No murmurs, rubs, gallops. Gastrointestinal system: Abdomen is nondistended, soft and nontender. No organomegaly or masses felt. Normal bowel sounds heard. Central nervous system: limited exam due to mental status Extremities: No C/C/E, +pedal pulses Skin: No rashes, lesions or ulcers Psychiatry: speech is incoherent    Data Reviewed: CBC: Recent Labs  Lab 10/08/21 1219 10/09/21 0429 10/09/21 1835 10/10/21 0412 10/11/21 0641 10/12/21 0453 10/13/21 0214  WBC 19.6*   < > 17.4* 17.2* 13.4* 13.8* 12.3*  NEUTROABS 17.5*  --  15.5*  --  11.9* 11.9* 10.4*  HGB 12.6*   < > 12.0* 11.6* 10.7* 11.2* 11.0*  HCT 41.2   < > 38.5* 36.5* 32.9* 35.9* 34.5*  MCV 96.9   < > 95.8 93.4 92.4 95.5 95.8  PLT 287   < > 292 315 266 301 339   < > = values in this interval not displayed.   Basic Metabolic Panel: Recent Labs  Lab 10/09/21 1407 10/09/21 1835 10/10/21 1319 10/10/21 1705 10/11/21 0457 10/11/21 0641 10/11/21 1648 10/12/21 0453 10/13/21 0214  NA 142 143  --   --   --  142  --  143 142  K 3.5 3.7  --   --   --  3.1*  --  4.0 3.8  CL 113* 115*  --   --   --  114*  --  113* 112*  CO2 24 24  --   --   --  24  --  23 25  GLUCOSE 107* 89  --   --   --  108*  --  129* 123*  BUN 18 19  --   --   --  11  --  11 10  CREATININE 0.68 0.73  --   --   --  0.69  --  0.49* 0.61  CALCIUM 7.6* 7.5*  --   --   --  7.1*  --  7.1* 7.0*  MG  --  2.4 2.2 2.0 1.9  --  2.0 1.9  --   PHOS  --  3.0 2.3* 2.2* 2.2*  --  3.1 1.8*  --    GFR: Estimated Creatinine Clearance: 71.4 mL/min  (by C-G formula based on SCr of 0.61 mg/dL). Liver Function Tests: Recent Labs  Lab 10/08/21 0321 10/09/21 1835 10/11/21 0641 10/12/21 0453 10/13/21 0214  AST 18 19 21 22  32  ALT 15 15 17 17 22   ALKPHOS 56 58 52 48 53  BILITOT 0.8 1.0 0.6 0.4 0.4  PROT 6.1* 5.6* 5.1* 5.2* 5.2*  ALBUMIN 2.4* 2.3* 1.9* 1.7* 1.8*   No results for input(s): LIPASE, AMYLASE in the last 168 hours. Recent Labs  Lab 10/09/21 1835  AMMONIA 18   Coagulation Profile: No results for input(s): INR, PROTIME in the last 168 hours. Cardiac Enzymes: No results for input(s): CKTOTAL, CKMB, CKMBINDEX, TROPONINI in the last 168 hours. BNP (last 3 results) No results for input(s): PROBNP in the last 8760 hours. HbA1C: No results for input(s): HGBA1C in the last 72 hours. CBG: Recent Labs  Lab 10/12/21 2111 10/12/21 2358 10/13/21 0421 10/13/21 0817 10/13/21 1218  GLUCAP 118* 134* 110* 125* 109*   Lipid Profile: No results for input(s): CHOL, HDL, LDLCALC, TRIG, CHOLHDL, LDLDIRECT in the last 72 hours. Thyroid Function Tests: No results for input(s): TSH, T4TOTAL, FREET4, T3FREE, THYROIDAB in the last 72 hours.   Anemia Panel: No results for input(s): VITAMINB12, FOLATE, FERRITIN, TIBC, IRON, RETICCTPCT in the last 72 hours.  Urine analysis:    Component Value Date/Time   COLORURINE YELLOW 10/05/2021 0631   APPEARANCEUR HAZY (A) 10/05/2021 0631   LABSPEC 1.028 10/05/2021 0631   PHURINE 5.0 10/05/2021 0631   GLUCOSEU NEGATIVE 10/05/2021 0631   HGBUR SMALL (A) 10/05/2021 0631   BILIRUBINUR NEGATIVE 10/05/2021 0631   KETONESUR 5 (A) 10/05/2021 0631   PROTEINUR NEGATIVE 10/05/2021 0631   UROBILINOGEN 0.2 11/19/2012 1138   NITRITE NEGATIVE 10/05/2021 0631   LEUKOCYTESUR NEGATIVE 10/05/2021 0631   Sepsis Labs: @LABRCNTIP (procalcitonin:4,lacticidven:4)  ) Recent Results (from the past 240 hour(s))  Culture, Respiratory w Gram Stain     Status: None   Collection Time: 10/06/21  2:03 PM    Specimen: Tracheal Aspirate  Result Value Ref Range Status   Specimen Description   Final    TRACHEAL ASPIRATE Performed at [facility], [place] [address] [place], [place] [address]    Special Requests   Final    NONE Performed at [facility], [place] [address], [place], [place] [address]    Gram Stain   Final    FEW SQUAMOUS EPITHELIAL CELLS PRESENT ABUNDANT WBC PRESENT,BOTH PMN  AND MONONUCLEAR ABUNDANT GRAM POSITIVE COCCI FEW GRAM POSITIVE RODS FEW GRAM NEGATIVE RODS Performed at [facility], [place] [address], [place], [place] [address]    Culture   Final    ABUNDANT STAPHYLOCOCCUS AUREUS MODERATE STREPTOCOCCUS PNEUMONIAE    Report Status 10/09/2021 FINAL  Final   Organism ID, Bacteria STAPHYLOCOCCUS AUREUS  Final   Organism ID, Bacteria STREPTOCOCCUS PNEUMONIAE  Final      Susceptibility   Staphylococcus aureus - MIC*    CIPROFLOXACIN <=0.5 SENSITIVE Sensitive     ERYTHROMYCIN <=0.25 SENSITIVE Sensitive     GENTAMICIN <=0.5 SENSITIVE Sensitive     OXACILLIN 1 SENSITIVE Sensitive     TETRACYCLINE <=1 SENSITIVE Sensitive     VANCOMYCIN <=0.5 SENSITIVE Sensitive     TRIMETH/SULFA <=10 SENSITIVE Sensitive     CLINDAMYCIN <=0.25 SENSITIVE Sensitive     RIFAMPIN <=0.5 SENSITIVE Sensitive     Inducible Clindamycin NEGATIVE Sensitive     * ABUNDANT STAPHYLOCOCCUS AUREUS   Streptococcus pneumoniae - MIC*    ERYTHROMYCIN RESISTANT Resistant     LEVOFLOXACIN 1 SENSITIVE Sensitive     VANCOMYCIN 0.5 SENSITIVE Sensitive     PENICILLIN (meningitis) <=0.06 SENSITIVE Sensitive     PENO - penicillin <=0.06      PENICILLIN (non-meningitis) <=0.06 SENSITIVE Sensitive     PENICILLIN (oral) <=0.06 SENSITIVE Sensitive     CEFTRIAXONE (non-meningitis) <=0.12 SENSITIVE Sensitive     CEFTRIAXONE (meningitis) <=0.12 SENSITIVE Sensitive     * MODERATE STREPTOCOCCUS PNEUMONIAE  Culture, blood (routine x 2)     Status: None (Preliminary result)    Collection Time: 10/09/21  6:35 PM   Specimen: BLOOD  Result Value Ref Range Status   Specimen Description   Final    BLOOD LEFT ANTECUBITAL Performed at [facility], [place] [address] [place], [place] [address]    Special Requests   Final    BOTTLES DRAWN AEROBIC ONLY Blood Culture results may not be optimal due to an inadequate volume of blood received in culture bottles Performed at [place] [address] [place], [place] [address]    Culture   Final    NO GROWTH 3 DAYS Performed at [facility], [place] [address], [place], [place] [address]    Report Status PENDING  Incomplete  Culture, blood (routine x 2)     Status: None (Preliminary result)   Collection Time: 10/09/21  6:35 PM   Specimen: BLOOD  Result Value Ref Range Status   Specimen Description   Final    BLOOD LEFT HAND Performed at [place] [address] [place], [place] [address]    Special Requests   Final    BOTTLES DRAWN AEROBIC ONLY Blood Culture adequate volume Performed at [place] [address] [place], [place] [address]    Culture   Final    NO GROWTH 3 DAYS Performed at [facility], [place] [address], [place], [place] [address]    Report Status PENDING  Incomplete      Studies: No results found.  Scheduled Meds:  aspirin  81 mg Oral Daily   chlorhexidine  15 mL Mouth Rinse BID   enoxaparin (LOVENOX) injection  1 mg/kg Subcutaneous BID   feeding supplement (PROSource TF)  45 mL Per Tube TID   free water  200 mL Per Tube Q8H   levothyroxine  100 mcg Oral Q0600   mouth rinse  15 mL Mouth  Rinse q12n4p    Continuous Infusions:  sodium chloride 50 mL/hr at 10/11/21 1014   cefTRIAXone (ROCEPHIN)  IV Stopped (10/13/21 1141)   feeding supplement (OSMOLITE 1.5 CAL) 1,000 mL (10/13/21 1813)     LOS: 9 days     Kathie Dike, MD [facility]   If 7PM-7AM, please contact  night-coverage [email or web address]  10/13/2021, 9:23 PM

## 2021-10-13 NOTE — Progress Notes (Signed)
[place] for IV heparin Indication: acute pulmonary embolus  Allergies  Allergen Reactions   Amlodipine Other (See Comments)    Reaction not recalled by patient   Chlorthalidone Other (See Comments)    Reaction not recalled by patient   Codeine Phosphate Other (See Comments)    Weakness, sweating   Hydrochlorothiazide W-Triamterene Other (See Comments)    Reaction not recalled by patient   Prednisone Itching   Sulfamethoxazole Itching   Triamterene Other (See Comments)    Reaction not recalled by patient    Patient Measurements: Height: 6' (182.9 cm) Weight: 74.8 kg (164 lb 14.5 oz) IBW/kg (Calculated) : 77.6 Heparin Dosing Weight: actual body weight   Vital Signs: Temp: 98.4 F (36.9 C) (01/05 0123) Temp Source: Axillary (01/05 0123) BP: 116/72 (01/05 0123) Pulse Rate: 75 (01/05 0123)  Labs: Recent Labs    10/11/21 0641 10/12/21 0453 10/12/21 1534 10/13/21 0214  HGB 10.7* 11.2*  --  11.0*  HCT 32.9* 35.9*  --  34.5*  PLT 266 301  --  339  HEPARINUNFRC  --  0.15* 0.29* 0.39  CREATININE 0.69 0.49*  --  0.61     Estimated Creatinine Clearance: 71.4 mL/min (by C-G formula based on SCr of 0.61 mg/dL).   Assessment: Patient is an 88 y.o M who was recently diagnosed with COVID-19 presented to the ED on 10/04/21 with AMS. Troponin was found to be elevated, but cardiology does not think clinical picture is consistent with ACS.  D-dimer on 10/05/21 was found to be >20 and chest CTA came back positive for bilateral PE. Pharmacy initially consulted for IV heparin, then transitioned to SQ enoxaparin on 10/09/21. Enoxaparin held on 10/10/21 for planned LP on 10/11/21. Per notes, LP unsuccessful today despite total of 4 attempts. Now received consult to resume anticoagulation with IV heparin. Confirmed with Dr. Nevada Crane okay to resume anticoagulation now (per MD, she discussed with Dr.Yamagata with IR who said safe to resume =/> 2 hours after  procedure).   Today, 10/13/2021: - Heparin level now therapeutic on IV heparin 1700 units/hr - CBC: Hg low but stable at 11, pltc WNL - No bleeding or infusion issues per RN  Goal of Therapy:  Heparin level 0.3-0.7 units/ml Monitor platelets by anticoagulation protocol: Yes   Plan:  - Continue IV heparin 1700 units/hr  - Check confirmatory heparin level in 8 hours  - Daily CBC, heparin level  - Monitor closely for s/sx bleeding - F/u plans for any further procedures  Netta Cedars, PharmD, BCPS [phone] 10/13/2021, 3:42 AM

## 2021-10-13 NOTE — Progress Notes (Signed)
[place] for IV heparin >> Lovenox Indication: acute pulmonary embolus  Allergies  Allergen Reactions   Amlodipine Other (See Comments)    Reaction not recalled by patient   Chlorthalidone Other (See Comments)    Reaction not recalled by patient   Codeine Phosphate Other (See Comments)    Weakness, sweating   Hydrochlorothiazide W-Triamterene Other (See Comments)    Reaction not recalled by patient   Prednisone Itching   Sulfamethoxazole Itching   Triamterene Other (See Comments)    Reaction not recalled by patient    Patient Measurements: Height: 6' (182.9 cm) Weight: 74.8 kg (164 lb 14.5 oz) IBW/kg (Calculated) : 77.6 Heparin Dosing Weight: actual body weight   Vital Signs: Temp: 99.1 F (37.3 C) (01/05 0423) Temp Source: Axillary (01/05 0423) BP: 131/89 (01/05 0423) Pulse Rate: 66 (01/05 0423)  Labs: Recent Labs    10/11/21 0641 10/12/21 0453 10/12/21 1534 10/13/21 0214  HGB 10.7* 11.2*  --  11.0*  HCT 32.9* 35.9*  --  34.5*  PLT 266 301  --  339  HEPARINUNFRC  --  0.15* 0.29* 0.39  CREATININE 0.69 0.49*  --  0.61     Estimated Creatinine Clearance: 71.4 mL/min (by C-G formula based on SCr of 0.61 mg/dL).   Assessment: Patient is an 88 y.o M who was recently diagnosed with COVID-19 presented to the ED on 10/04/21 with AMS. Troponin was found to be elevated, but cardiology does not think clinical picture is consistent with ACS.  D-dimer on 10/05/21 was found to be >20 and chest CTA came back positive for bilateral PE. Pharmacy initially consulted for IV heparin, then transitioned to SQ enoxaparin on 10/09/21. Enoxaparin held on 10/10/21 for planned LP on 10/11/21. Per notes, LP unsuccessful today despite total of 4 attempts. Now received consult to resume anticoagulation with IV heparin. Confirmed with Dr. [place] Crane okay to resume anticoagulation now (per MD, she discussed with Dr.Yamagata with IR who said safe to resume =/> 2 hours  after procedure).   1/5: With no further procedures planned, pharmacy consulted to transition patient to Lovenox.    Plan:  - Will initiate Lovenox 1 mg/kg bid  - Will discontinue heparin infusion at the time of the initial Lovenox dose - Will f/u CBC, renal function, and check levels if indicated  Ulice Dash, PharmD  10/13/2021, 11:53 AM

## 2021-10-14 DIAGNOSIS — U071 COVID-19: Secondary | ICD-10-CM | POA: Diagnosis not present

## 2021-10-14 DIAGNOSIS — G9340 Encephalopathy, unspecified: Secondary | ICD-10-CM | POA: Diagnosis not present

## 2021-10-14 DIAGNOSIS — F0390 Unspecified dementia without behavioral disturbance: Secondary | ICD-10-CM | POA: Diagnosis not present

## 2021-10-14 LAB — CBC
HCT: 36.5 % — ABNORMAL LOW (ref 39.0–52.0)
Hemoglobin: 11.6 g/dL — ABNORMAL LOW (ref 13.0–17.0)
MCH: 29.7 pg (ref 26.0–34.0)
MCHC: 31.8 g/dL (ref 30.0–36.0)
MCV: 93.4 fL (ref 80.0–100.0)
Platelets: 366 10*3/uL (ref 150–400)
RBC: 3.91 MIL/uL — ABNORMAL LOW (ref 4.22–5.81)
RDW: 12.9 % (ref 11.5–15.5)
WBC: 9.8 10*3/uL (ref 4.0–10.5)
nRBC: 0 % (ref 0.0–0.2)

## 2021-10-14 LAB — GLUCOSE, CAPILLARY
Glucose-Capillary: 103 mg/dL — ABNORMAL HIGH (ref 70–99)
Glucose-Capillary: 110 mg/dL — ABNORMAL HIGH (ref 70–99)
Glucose-Capillary: 112 mg/dL — ABNORMAL HIGH (ref 70–99)
Glucose-Capillary: 88 mg/dL (ref 70–99)
Glucose-Capillary: 93 mg/dL (ref 70–99)

## 2021-10-14 LAB — VITAMIN B1: Vitamin B1 (Thiamine): 86.5 nmol/L (ref 66.5–200.0)

## 2021-10-14 MED ORDER — LOPERAMIDE HCL 2 MG PO CAPS
2.0000 mg | ORAL_CAPSULE | Freq: Four times a day (QID) | ORAL | Status: DC | PRN
  Administered 2021-10-14: 2 mg via ORAL
  Filled 2021-10-14: qty 1

## 2021-10-14 MED ORDER — LIP MEDEX EX OINT
TOPICAL_OINTMENT | CUTANEOUS | Status: DC | PRN
  Filled 2021-10-14: qty 7

## 2021-10-14 NOTE — Progress Notes (Signed)
Speech Language Pathology Treatment: Dysphagia  Patient Details Name: Ian Weber MRN: [id number] DOB: 1934/07/02 Today's Date: 10/14/2021 Time: 6979-4801 SLP Time Calculation (min) (ACUTE ONLY): 15 min  Assessment / Plan / Recommendation Clinical Impression  Pt demonstrates improved arousal, but his attention to PO is brief. Pt was able to masticate ice briefly and with cues swallowed melted ice and a teaspoon of honey thick water. He did have immediate weak coughing x 1 but otherwise had concern for aspiration via increased upper respiratory wheeze and congestion. He appears very weak. Though he may be capable of participating in some therapeutic activities, he is not a a point that is appropriate for diet initiation. Pt could have comfort feeidng with known risk, but he doesn't even appear interested by PO due to fatigue. Will continue to follow for trials.   HPI HPI: Ian Weber is a 87 yo male with a PMH of dementia, hypertension, hypothyroidism, history of GI bleed who presents from home with concerns for increasing altered mental status, poor appetite and poor PO intake. He tested positive for COVID about six days PTA. Dx acute metabolic encephalopathy secondary to COVID-19, hypernatremia, dehydration.      SLP Plan  Continue with current plan of care      Recommendations for follow up therapy are one component of a multi-disciplinary discharge planning process, led by the attending physician.  Recommendations may be updated based on patient status, additional functional criteria and insurance authorization.    Recommendations  Diet recommendations: NPO                Oral Care Recommendations: Oral care QID Follow Up Recommendations: Skilled nursing-short term rehab (<3 hours/day) SLP Visit Diagnosis: Dysphagia, unspecified (R13.10) Plan: Continue with current plan of care           Naudia Crosley, Katherene Ponto  10/14/2021, 2:49 PM

## 2021-10-14 NOTE — Progress Notes (Signed)
PROGRESS NOTE  [name] W[facility]:[id number] DOB: 1933-11-17 DOA: 10/04/2021 PCP: Jilda Panda, MD  HPI/Recap of past 24 hours: Ian Weber is a 88 y.o. male with medical history significant for dementia, hypertension, hypothyroidism, history of GI bleed who presents from home with concerns of increasing altered mental status and generalized weakness.  Associated with poor appetite and poor oral intake.  Was brought to the ED for further evaluation.  Recent COVID-19 positive 09/28/21.  Started on antiviral IV remdesivir.  Hospital course complicated by findings of B/L pulmonary embolism, started hep drip 10/05/21. Minimal responsiveness, restless agitation delirium reported early morning 12/30.    10/14/21: wakes up to voice and makes eye contact, but speech is not coherent  Assessment/Plan: Principal Problem:   AMS (altered mental status) Active Problems:   Pressure injury of skin   COVID-19 virus infection   Elevated troponin   Dementia without behavioral disturbance (HCC)   HTN (hypertension)   Persistent acute metabolic encephalopathy, multifactorial secondary to COVID-19 viral infection and hypernatremia from poor oral intake, dehydration.  Covid 19+ 09/28/21 CT head nonacute, CT abdomen and pelvis with contrast nonacute. NG tube placement for hydration on 10/07/2021.  Continue free water flushing to avoid dehydration. No oral intake for several days, updated his wife at bedside.  She declines tube feeding on 10/09/2021.  After discussing with PCCM, family agreed to tube feeding, started 10/09/21 osmolite 1.2 cal trickle feeds.  Dietitian consulted. Ammonia, B12, TSH, MRI brain, Trop, all nonrevealing.  Blood cx x 2 peripherally, in process. Thiamine level in process, he has been receiving high-dose IV thiamine Patient seen by neurology EEG did not show any epileptiform discharges Lumbar puncture was attempted by radiology on 1/3, but was unsuccessful after 4 attempts Suspicion is  that his encephalopathy is related to COVID in the setting of dementia.  May also have a component of hospital delirium. His wife currently feels that his mental status is mildly better today She wishes to continue with current treatments and monitor for further improvement. I expressed that if he does not make meaningful improvements, would need to be addressed goals of care and continued tube feeding. Prior to lumbar puncture times, he was started empirically on vancomycin, ceftriaxone, ampicillin and acyclovir Without CSF data, difficult to determine need for continued therapy Discussed with infectious disease, will continue total of 2 weeks of ceftriaxone 2 g every 12 hours, but will discontinue acyclovir, ampicillin and vancomycin for now and observe.   COVID-19 viral infection with Staph aureus and Streptococcus pneumonia coinfection, POA Sputum culture obtained on 10/06/2021, returned positive for staph aureus and Streptococcus pneumoniae  Discussed with ID and will be treated with ceftriaxone  Newly diagnosed left lower lobe Staph aureus and Streptococcus community-acquired pneumonia, POA Management as stated above Monitor fever curve and WBC  Newly diagnosed bilateral acute pulmonary embolism Started on heparin drip on 10/05/2021 No evidence of right heart strain on CT scan.  Left pleural effusion in setting of pneumonia Hold off IV fluid hydration to avoid worsening of left pleural effusion Continue IV antibiotics  Resolved hypovolemic hypernatremia in the setting of poor oral intake Free water flushes 200 cc every 6 hours. Hold off IV fluid hydration to avoid worsening left pleural effusion.  Acute hypoxic respiratory failure secondary to above Continue to maintain oxygen saturation greater than 92% Hypoxemia seen on ABG 10/06/2021  Elevated troponin in the setting of covid 19 infection and bilateral pulmonary embolism Trop S peaked at >4300 and down  trended -Echocardiogram shows ejection fraction of 55 to 60% with no wall motion abnormalities. Continue to monitor on telemetry  Prolonged QTC QTC greater than 500 on 12 EKG done on 10/04/2021 Unable to obtain 12 EKG as the patient would not cooperate. Continue to optimize magnesium and potassium levels Continue to avoid QTC prolonging agents  Covid-19 viral infection, POA He received a course of remdesivir  Resolved post repletion: Hypokalemia K+ 3.2> 3.8 Continue to replete electrolytes as indicated.  Dementia Continue fall/aspiration/delirium precautions  Goals of care Palliative care team consulted to assist with establishing goals of care Ongoing discussion of goals of care rwith family.  DNR as of 10/07/2021 -wife is insistent on taking patient home on discharge, but I am not sure that she realizes that he will be total care I spent time talking to her daughter Ivin Booty, and expressed my concerns about patient safety at home since he will require total care I also expressed my concerns to Ivin Booty around patient poor prognosis and suggested that a hospice approach be considered Ivin Booty said she will have to discuss with her family  Pressure Injury 10/04/21 Buttocks Bilateral Stage 2 -  Partial thickness loss of dermis presenting as a shallow open injury with a red, pink wound bed without slough. (Active) 10/04/21 2300 Location: Buttocks Location Orientation: Bilateral Staging: Stage 2 -  Partial thickness loss of dermis presenting as a shallow open injury with a red, pink wound bed without slough. Wound Description (Comments): Present on Admission:Yes    Code Status: DNR  Family Communication: Updated his wife at bedside.  Disposition Plan: Poor prognosis.   Consultants: Palliative care team Neurology   Procedures: 2D echo   Antimicrobials: Ceftriaxone 1/2 > Vancomycin 1/2 >1/5 Ampicillin 1/2 >1/5 Acyclovir 1/2 >1/5  DVT prophylaxis: Therapeutic  lovenox  Status is: Inpatient  Patient requires at least 2 midnights for further evaluation and treatment of present conditions.        Objective: Vitals:   10/14/21 1700 10/14/21 1800 10/14/21 1900 10/14/21 2034  BP:    131/83  Pulse:    61  Resp: 19 17 18    Temp:    98.5 F (36.9 C)  TempSrc:    Oral  SpO2:    100%  Weight:      Height:        Intake/Output Summary (Last 24 hours) at 10/14/2021 2307 Last data filed at 10/14/2021 1800 Gross per 24 hour  Intake 558.33 ml  Output 2150 ml  Net -1591.67 ml   Filed Weights   10/04/21 1538 10/10/21 2100 10/11/21 0445  Weight: 72.6 kg 73.6 kg 74.8 kg    Exam:  General exam: wakes up to voice, cannot comprehend speech Respiratory system: bilateral rhonchi. Respiratory effort normal. Cardiovascular system:RRR. No murmurs, rubs, gallops. Gastrointestinal system: Abdomen is nondistended, soft and nontender. No organomegaly or masses felt. Normal bowel sounds heard. Central nervous system: limited exam due to mental status Extremities: No C/C/E, +pedal pulses Skin: No rashes, lesions or ulcers Psychiatry: speech is incoherent    Data Reviewed: CBC: Recent Labs  Lab 10/08/21 1219 10/09/21 0429 10/09/21 1835 10/10/21 0412 10/11/21 0641 10/12/21 0453 10/13/21 0214 10/14/21 0417  WBC 19.6*   < > 17.4* 17.2* 13.4* 13.8* 12.3* 9.8  NEUTROABS 17.5*  --  15.5*  --  11.9* 11.9* 10.4*  --   HGB 12.6*   < > 12.0* 11.6* 10.7* 11.2* 11.0* 11.6*  HCT 41.2   < > 38.5* 36.5* 32.9* 35.9* 34.5* 36.5*  MCV 96.9   < > 95.8 93.4 92.4 95.5 95.8 93.4  PLT 287   < > 292 315 266 301 339 366   < > = values in this interval not displayed.   Basic Metabolic Panel: Recent Labs  Lab 10/09/21 1407 10/09/21 1835 10/10/21 1319 10/10/21 1705 10/11/21 0457 10/11/21 0641 10/11/21 1648 10/12/21 0453 10/13/21 0214  NA 142 143  --   --   --  142  --  143 142  K 3.5 3.7  --   --   --  3.1*  --  4.0 3.8  CL 113* 115*  --   --   --  114*   --  113* 112*  CO2 24 24  --   --   --  24  --  23 25  GLUCOSE 107* 89  --   --   --  108*  --  129* 123*  BUN 18 19  --   --   --  11  --  11 10  CREATININE 0.68 0.73  --   --   --  0.69  --  0.49* 0.61  CALCIUM 7.6* 7.5*  --   --   --  7.1*  --  7.1* 7.0*  MG  --  2.4 2.2 2.0 1.9  --  2.0 1.9  --   PHOS  --  3.0 2.3* 2.2* 2.2*  --  3.1 1.8*  --    GFR: Estimated Creatinine Clearance: 71.4 mL/min (by C-G formula based on SCr of 0.61 mg/dL). Liver Function Tests: Recent Labs  Lab 10/08/21 0321 10/09/21 1835 10/11/21 0641 10/12/21 0453 10/13/21 0214  AST 18 19 21 22  32  ALT 15 15 17 17 22   ALKPHOS 56 58 52 48 53  BILITOT 0.8 1.0 0.6 0.4 0.4  PROT 6.1* 5.6* 5.1* 5.2* 5.2*  ALBUMIN 2.4* 2.3* 1.9* 1.7* 1.8*   No results for input(s): LIPASE, AMYLASE in the last 168 hours. Recent Labs  Lab 10/09/21 1835  AMMONIA 18   Coagulation Profile: No results for input(s): INR, PROTIME in the last 168 hours. Cardiac Enzymes: No results for input(s): CKTOTAL, CKMB, CKMBINDEX, TROPONINI in the last 168 hours. BNP (last 3 results) No results for input(s): PROBNP in the last 8760 hours. HbA1C: No results for input(s): HGBA1C in the last 72 hours. CBG: Recent Labs  Lab 10/14/21 0051 10/14/21 0415 10/14/21 0758 10/14/21 1149 10/14/21 2032  GLUCAP 88 93 112* 103* 110*   Lipid Profile: No results for input(s): CHOL, HDL, LDLCALC, TRIG, CHOLHDL, LDLDIRECT in the last 72 hours. Thyroid Function Tests: No results for input(s): TSH, T4TOTAL, FREET4, T3FREE, THYROIDAB in the last 72 hours.   Anemia Panel: No results for input(s): VITAMINB12, FOLATE, FERRITIN, TIBC, IRON, RETICCTPCT in the last 72 hours.  Urine analysis:    Component Value Date/Time   COLORURINE YELLOW 10/05/2021 0631   APPEARANCEUR HAZY (A) 10/05/2021 0631   LABSPEC 1.028 10/05/2021 0631   PHURINE 5.0 10/05/2021 0631   GLUCOSEU NEGATIVE 10/05/2021 0631   HGBUR SMALL (A) 10/05/2021 0631   BILIRUBINUR NEGATIVE  10/05/2021 0631   KETONESUR 5 (A) 10/05/2021 0631   PROTEINUR NEGATIVE 10/05/2021 0631   UROBILINOGEN 0.2 11/19/2012 1138   NITRITE NEGATIVE 10/05/2021 0631   LEUKOCYTESUR NEGATIVE 10/05/2021 0631   Sepsis Labs: @LABRCNTIP (procalcitonin:4,lacticidven:4)  ) Recent Results (from the past 240 hour(s))  Culture, Respiratory w Gram Stain     Status: None   Collection Time: 10/06/21  2:03 PM  Specimen: Tracheal Aspirate  Result Value Ref Range Status   Specimen Description   Final    TRACHEAL ASPIRATE Performed at [facility] [address], [place], [place] [address]    Special Requests   Final    NONE Performed at [facility], [place] [address], [place], [place] [address]    Gram Stain   Final    FEW SQUAMOUS EPITHELIAL CELLS PRESENT ABUNDANT WBC PRESENT,BOTH PMN AND MONONUCLEAR ABUNDANT GRAM POSITIVE COCCI FEW GRAM POSITIVE RODS FEW GRAM NEGATIVE RODS Performed at [facility], [place] [address] [place], [place] [address]    Culture   Final    ABUNDANT STAPHYLOCOCCUS AUREUS MODERATE STREPTOCOCCUS PNEUMONIAE    Report Status 10/09/2021 FINAL  Final   Organism ID, Bacteria STAPHYLOCOCCUS AUREUS  Final   Organism ID, Bacteria STREPTOCOCCUS PNEUMONIAE  Final      Susceptibility   Staphylococcus aureus - MIC*    CIPROFLOXACIN <=0.5 SENSITIVE Sensitive     ERYTHROMYCIN <=0.25 SENSITIVE Sensitive     GENTAMICIN <=0.5 SENSITIVE Sensitive     OXACILLIN 1 SENSITIVE Sensitive     TETRACYCLINE <=1 SENSITIVE Sensitive     VANCOMYCIN <=0.5 SENSITIVE Sensitive     TRIMETH/SULFA <=10 SENSITIVE Sensitive     CLINDAMYCIN <=0.25 SENSITIVE Sensitive     RIFAMPIN <=0.5 SENSITIVE Sensitive     Inducible Clindamycin NEGATIVE Sensitive     * ABUNDANT STAPHYLOCOCCUS AUREUS   Streptococcus pneumoniae - MIC*    ERYTHROMYCIN RESISTANT Resistant     LEVOFLOXACIN 1 SENSITIVE Sensitive     VANCOMYCIN 0.5 SENSITIVE Sensitive     PENICILLIN (meningitis)  <=0.06 SENSITIVE Sensitive     PENO - penicillin <=0.06      PENICILLIN (non-meningitis) <=0.06 SENSITIVE Sensitive     PENICILLIN (oral) <=0.06 SENSITIVE Sensitive     CEFTRIAXONE (non-meningitis) <=0.12 SENSITIVE Sensitive     CEFTRIAXONE (meningitis) <=0.12 SENSITIVE Sensitive     * MODERATE STREPTOCOCCUS PNEUMONIAE  Culture, blood (routine x 2)     Status: None (Preliminary result)   Collection Time: 10/09/21  6:35 PM   Specimen: BLOOD  Result Value Ref Range Status   Specimen Description   Final    BLOOD LEFT ANTECUBITAL Performed at [facility], [place] [address] [place], [place] [address]    Special Requests   Final    BOTTLES DRAWN AEROBIC ONLY Blood Culture results may not be optimal due to an inadequate volume of blood received in culture bottles Performed at [facility] [address], [place], [place] [address]    Culture   Final    NO GROWTH 4 DAYS Performed at [facility], [place] [address] [place], [place] [address]    Report Status PENDING  Incomplete  Culture, blood (routine x 2)     Status: None (Preliminary result)   Collection Time: 10/09/21  6:35 PM   Specimen: BLOOD  Result Value Ref Range Status   Specimen Description   Final    BLOOD LEFT HAND Performed at [facility] [address] [place], [place] [address]    Special Requests   Final    BOTTLES DRAWN AEROBIC ONLY Blood Culture adequate volume Performed at [facility] [address] [place], [place] [address]    Culture   Final    NO GROWTH 4 DAYS Performed at [facility], [place] [address] [place],  [address]    Report Status  PENDING  Incomplete      Studies: No results found.  Scheduled Meds:  aspirin  81 mg Oral Daily   chlorhexidine  15 mL Mouth Rinse BID   enoxaparin (LOVENOX) injection  1 mg/kg Subcutaneous BID   feeding supplement (PROSource TF)  45 mL Per Tube TID   free water  200  mL Per Tube Q8H   levothyroxine  100 mcg Oral Q0600   mouth rinse  15 mL Mouth Rinse q12n4p    Continuous Infusions:  cefTRIAXone (ROCEPHIN)  IV 2 g (10/14/21 2106)   feeding supplement (OSMOLITE 1.5 CAL) 1,000 mL (10/13/21 1813)     LOS: 10 days     Kathie Dike, MD [facility]   If 7PM-7AM, please contact night-coverage [email or web address]  10/14/2021, 11:07 PM

## 2021-10-15 DIAGNOSIS — G9340 Encephalopathy, unspecified: Secondary | ICD-10-CM | POA: Diagnosis not present

## 2021-10-15 DIAGNOSIS — R4182 Altered mental status, unspecified: Secondary | ICD-10-CM | POA: Diagnosis not present

## 2021-10-15 DIAGNOSIS — F0390 Unspecified dementia without behavioral disturbance: Secondary | ICD-10-CM | POA: Diagnosis not present

## 2021-10-15 DIAGNOSIS — U071 COVID-19: Secondary | ICD-10-CM | POA: Diagnosis not present

## 2021-10-15 LAB — GLUCOSE, CAPILLARY
Glucose-Capillary: 100 mg/dL — ABNORMAL HIGH (ref 70–99)
Glucose-Capillary: 101 mg/dL — ABNORMAL HIGH (ref 70–99)
Glucose-Capillary: 103 mg/dL — ABNORMAL HIGH (ref 70–99)
Glucose-Capillary: 113 mg/dL — ABNORMAL HIGH (ref 70–99)
Glucose-Capillary: 115 mg/dL — ABNORMAL HIGH (ref 70–99)
Glucose-Capillary: 122 mg/dL — ABNORMAL HIGH (ref 70–99)

## 2021-10-15 LAB — RENAL FUNCTION PANEL
Albumin: 2 g/dL — ABNORMAL LOW (ref 3.5–5.0)
Anion gap: 5 (ref 5–15)
BUN: 7 mg/dL — ABNORMAL LOW (ref 8–23)
CO2: 27 mmol/L (ref 22–32)
Calcium: 7.6 mg/dL — ABNORMAL LOW (ref 8.9–10.3)
Chloride: 107 mmol/L (ref 98–111)
Creatinine, Ser: 0.55 mg/dL — ABNORMAL LOW (ref 0.61–1.24)
GFR, Estimated: >60 mL/min (ref >60)
Glucose, Bld: 103 mg/dL — ABNORMAL HIGH (ref 70–99)
Phosphorus: 2.9 mg/dL (ref 2.5–4.6)
Potassium: 4.4 mmol/L (ref 3.5–5.1)
Sodium: 139 mmol/L (ref 135–145)

## 2021-10-15 LAB — CBC
HCT: 36.6 % — ABNORMAL LOW (ref 39.0–52.0)
Hemoglobin: 12 g/dL — ABNORMAL LOW (ref 13.0–17.0)
MCH: 30.1 pg (ref 26.0–34.0)
MCHC: 32.8 g/dL (ref 30.0–36.0)
MCV: 91.7 fL (ref 80.0–100.0)
Platelets: 359 10*3/uL (ref 150–400)
RBC: 3.99 MIL/uL — ABNORMAL LOW (ref 4.22–5.81)
RDW: 12.8 % (ref 11.5–15.5)
WBC: 8.3 10*3/uL (ref 4.0–10.5)
nRBC: 0 % (ref 0.0–0.2)

## 2021-10-15 LAB — CULTURE, BLOOD (ROUTINE X 2)
Culture: NO GROWTH
Culture: NO GROWTH
Special Requests: ADEQUATE

## 2021-10-15 NOTE — Progress Notes (Signed)
PROGRESS NOTE  [name] LNL:[id number] DOB: 21-Oct-1933 DOA: 10/04/2021 PCP: Jilda Panda, MD  HPI/Recap of past 24 hours: Ian Weber is a 88 y.o. male with medical history significant for dementia, hypertension, hypothyroidism, history of GI bleed who presents from home with concerns of increasing altered mental status and generalized weakness.  Associated with poor appetite and poor oral intake.  Was brought to the ED for further evaluation.  Recent COVID-19 positive 09/28/21.  Started on antiviral IV remdesivir.  Hospital course complicated by findings of B/L pulmonary embolism, started hep drip 10/05/21. Minimal responsiveness, restless agitation delirium reported early morning 12/30.    10/14/21: Wakes up to voice.  He is able to cough when asked.  He also lifts up both his arms when asked.  Staff did note some hematuria in his condom catheter  Assessment/Plan: Principal Problem:   AMS (altered mental status) Active Problems:   Pressure injury of skin   COVID-19 virus infection   Elevated troponin   Dementia without behavioral disturbance (HCC)   HTN (hypertension)   Persistent acute metabolic encephalopathy, multifactorial secondary to COVID-19 viral infection and hypernatremia from poor oral intake, dehydration.  Covid 19+ 09/28/21 CT head nonacute, CT abdomen and pelvis with contrast nonacute. NG tube placement for hydration on 10/07/2021.  Continue free water flushing to avoid dehydration. No oral intake for several days, updated his wife at bedside.  She declines tube feeding on 10/09/2021.  After discussing with PCCM, family agreed to tube feeding, started 10/09/21 osmolite 1.2 cal trickle feeds.  Dietitian consulted. Ammonia, B12, TSH, MRI brain, Trop, all nonrevealing.  Blood cx x 2 peripherally, in process. Thiamine level in process, he has been receiving high-dose IV thiamine Patient seen by neurology EEG did not show any epileptiform discharges Lumbar puncture was  attempted by radiology on 1/3, but was unsuccessful after 4 attempts Suspicion is that his encephalopathy is related to COVID in the setting of dementia.  May also have a component of hospital delirium. His wife currently feels that his mental status is mildly better today She wishes to continue with current treatments and monitor for further improvement. I expressed that if he does not make meaningful improvements, would need to readdress goals of care and continued tube feeding. Prior to lumbar puncture attempts, he was started empirically on vancomycin, ceftriaxone, ampicillin and acyclovir Without CSF data, difficult to determine need for continued therapy Discussed with infectious disease, will continue total of 2 weeks of ceftriaxone 2 g every 12 hours (to be completed on 1/16), but will discontinue acyclovir, ampicillin and vancomycin for now and observe. -Currently, mental status appears to be very gradually improving.  He is currently at a point where he can make eye contact, follows some basic commands.  He is still not able to swallow.   COVID-19 viral infection with Staph aureus and Streptococcus pneumonia coinfection, POA Sputum culture obtained on 10/06/2021, returned positive for staph aureus and Streptococcus pneumoniae  Discussed with ID and will be treated with ceftriaxone  Newly diagnosed left lower lobe Staph aureus and Streptococcus community-acquired pneumonia, POA Management as stated above Monitor fever curve and WBC  Newly diagnosed bilateral acute pulmonary embolism Started on heparin drip on 10/05/2021, subsequently transition to Lovenox No evidence of right heart strain on CT scan.  Hematuria -Noted in condom catheter -Continue to monitor for now and follow hemoglobin -If significant drop in hemoglobin occurs, would need to hold anticoagulation.  Left pleural effusion in setting of pneumonia Hold off  IV fluid hydration to avoid worsening of left pleural  effusion Continue IV antibiotics  Resolved hypovolemic hypernatremia in the setting of poor oral intake Free water flushes 200 cc every 6 hours. Hold off IV fluid hydration to avoid worsening left pleural effusion.  Acute hypoxic respiratory failure secondary to above Continue to maintain oxygen saturation greater than 92% Hypoxemia seen on ABG 10/06/2021  Elevated troponin in the setting of covid 19 infection and bilateral pulmonary embolism Trop S peaked at >4300 and down trended -Echocardiogram shows ejection fraction of 55 to 60% with no wall motion abnormalities. Continue to monitor on telemetry  Prolonged QTC QTC greater than 500 on 12 EKG done on 10/04/2021 Unable to obtain 12 EKG as the patient would not cooperate. Continue to optimize magnesium and potassium levels Continue to avoid QTC prolonging agents  Covid-19 viral infection, POA He received a course of remdesivir  Resolved post repletion: Hypokalemia K+ 3.2> 3.8 Continue to replete electrolytes as indicated.  Dementia Patient's daughter does confirm that he did have mild cognitive deficits prior to his current illness at baseline.   Continue fall/aspiration/delirium precautions  Goals of care Palliative care team consulted to assist with establishing goals of care Ongoing discussion of goals of care with family.  DNR as of 10/07/2021 -wife is insistent on taking patient home on discharge, but I am not sure that she realizes that he will be total care I spent time talking to her daughter Ivin Booty, and expressed my concerns about patient safety at home since he will require total care Today, patient's cousin was also in the room with his wife during my visit.  Cousin recounts her belief that there have been many miracles performed and patient's family and she is convinced that his recovery will also be a miracle.  After acknowledging their faith, I attempted to discuss how we should approach patient's care in the  event of possible persistent, long-term disability.  She did not wish to acknowledge this possibility and was certain that the patient would have a full recovery. Patient's wife appeared to agree with his cousins views.  She reiterated that she would prefer to take the patient home and inquired about possible private duty sitters.  We will asked TOC to provide his wife with further information on this.  Pressure Injury 10/04/21 Buttocks Bilateral Stage 2 -  Partial thickness loss of dermis presenting as a shallow open injury with a red, pink wound bed without slough. (Active) 10/04/21 2300 Location: Buttocks Location Orientation: Bilateral Staging: Stage 2 -  Partial thickness loss of dermis presenting as a shallow open injury with a red, pink wound bed without slough. Wound Description (Comments): Present on Admission:Yes    Code Status: DNR  Family Communication: Updated his wife at bedside.  Disposition Plan: Poor prognosis.   Consultants: Palliative care team Neurology   Procedures: 2D echo   Antimicrobials: Ceftriaxone 1/2 >1/16 Vancomycin 1/2 >1/5 Ampicillin 1/2 >1/5 Acyclovir 1/2 >1/5  DVT prophylaxis: Therapeutic lovenox  Status is: Inpatient  Patient requires at least 2 midnights for further evaluation and treatment of present conditions.        Objective: Vitals:   10/14/21 1800 10/14/21 1900 10/14/21 2034 10/15/21 0609  BP:   131/83   Pulse:   61 62  Resp: 17 18    Temp:   98.5 F (36.9 C) 97.7 F (36.5 C)  TempSrc:   Oral Axillary  SpO2:   100%   Weight:    72.6 kg  Height:  Intake/Output Summary (Last 24 hours) at 10/15/2021 2030 Last data filed at 10/15/2021 1900 Gross per 24 hour  Intake 2300 ml  Output 2600 ml  Net -300 ml   Filed Weights   10/10/21 2100 10/11/21 0445 10/15/21 0609  Weight: 73.6 kg 74.8 kg 72.6 kg    Exam:  General exam: Alert, awake, he is able to follow some commands Respiratory system: Bilateral rhonchi.   Respiratory effort normal. Cardiovascular system:RRR. No murmurs, rubs, gallops. Gastrointestinal system: Abdomen is nondistended, soft and nontender. No organomegaly or masses felt. Normal bowel sounds heard. Central nervous system: No focal neurological deficit, he is able to raise both of his arms up when asked Extremities: No C/C/E, +pedal pulses Skin: No rashes, lesions or ulcers Psychiatry: Judgement and insight appear normal. Mood & affect appropriate.     Data Reviewed: CBC: Recent Labs  Lab 10/09/21 1835 10/10/21 0412 10/11/21 8937 10/12/21 0453 10/13/21 0214 10/14/21 0417 10/15/21 0426  WBC 17.4*   < > 13.4* 13.8* 12.3* 9.8 8.3  NEUTROABS 15.5*  --  11.9* 11.9* 10.4*  --   --   HGB 12.0*   < > 10.7* 11.2* 11.0* 11.6* 12.0*  HCT 38.5*   < > 32.9* 35.9* 34.5* 36.5* 36.6*  MCV 95.8   < > 92.4 95.5 95.8 93.4 91.7  PLT 292   < > 266 301 339 366 359   < > = values in this interval not displayed.   Basic Metabolic Panel: Recent Labs  Lab 10/09/21 1835 10/10/21 1319 10/10/21 1705 10/11/21 0457 10/11/21 0641 10/11/21 1648 10/12/21 0453 10/13/21 0214 10/15/21 0426  NA 143  --   --   --  142  --  143 142 139  K 3.7  --   --   --  3.1*  --  4.0 3.8 4.4  CL 115*  --   --   --  114*  --  113* 112* 107  CO2 24  --   --   --  24  --  23 25 27   GLUCOSE 89  --   --   --  108*  --  129* 123* 103*  BUN 19  --   --   --  11  --  11 10 7*  CREATININE 0.73  --   --   --  0.69  --  0.49* 0.61 0.55*  CALCIUM 7.5*  --   --   --  7.1*  --  7.1* 7.0* 7.6*  MG 2.4 2.2 2.0 1.9  --  2.0 1.9  --   --   PHOS 3.0 2.3* 2.2* 2.2*  --  3.1 1.8*  --  2.9   GFR: Estimated Creatinine Clearance: 69.3 mL/min (A) (by C-G formula based on SCr of 0.55 mg/dL (L)). Liver Function Tests: Recent Labs  Lab 10/09/21 1835 10/11/21 0641 10/12/21 0453 10/13/21 0214 10/15/21 0426  AST 19 21 22  32  --   ALT 15 17 17 22   --   ALKPHOS 58 52 48 53  --   BILITOT 1.0 0.6 0.4 0.4  --   PROT 5.6* 5.1*  5.2* 5.2*  --   ALBUMIN 2.3* 1.9* 1.7* 1.8* 2.0*   No results for input(s): LIPASE, AMYLASE in the last 168 hours. Recent Labs  Lab 10/09/21 1835  AMMONIA 18   Coagulation Profile: No results for input(s): INR, PROTIME in the last 168 hours. Cardiac Enzymes: No results for input(s): CKTOTAL, CKMB, CKMBINDEX, TROPONINI in the last 168  hours. BNP (last 3 results) No results for input(s): PROBNP in the last 8760 hours. HbA1C: No results for input(s): HGBA1C in the last 72 hours. CBG: Recent Labs  Lab 10/15/21 0047 10/15/21 0617 10/15/21 0740 10/15/21 1143 10/15/21 1652  GLUCAP 115* 101* 100* 103* 122*   Lipid Profile: No results for input(s): CHOL, HDL, LDLCALC, TRIG, CHOLHDL, LDLDIRECT in the last 72 hours. Thyroid Function Tests: No results for input(s): TSH, T4TOTAL, FREET4, T3FREE, THYROIDAB in the last 72 hours.   Anemia Panel: No results for input(s): VITAMINB12, FOLATE, FERRITIN, TIBC, IRON, RETICCTPCT in the last 72 hours.  Urine analysis:    Component Value Date/Time   COLORURINE YELLOW 10/05/2021 0631   APPEARANCEUR HAZY (A) 10/05/2021 0631   LABSPEC 1.028 10/05/2021 0631   PHURINE 5.0 10/05/2021 0631   GLUCOSEU NEGATIVE 10/05/2021 0631   HGBUR SMALL (A) 10/05/2021 0631   BILIRUBINUR NEGATIVE 10/05/2021 0631   KETONESUR 5 (A) 10/05/2021 0631   PROTEINUR NEGATIVE 10/05/2021 0631   UROBILINOGEN 0.2 11/19/2012 1138   NITRITE NEGATIVE 10/05/2021 0631   LEUKOCYTESUR NEGATIVE 10/05/2021 0631   Sepsis Labs: @LABRCNTIP (procalcitonin:4,lacticidven:4)  ) Recent Results (from the past 240 hour(s))  Culture, Respiratory w Gram Stain     Status: None   Collection Time: 10/06/21  2:03 PM   Specimen: Tracheal Aspirate  Result Value Ref Range Status   Specimen Description   Final    TRACHEAL ASPIRATE Performed at [facility], [place] [address] [place], [place] [address]    Special Requests   Final    NONE Performed at [facility], [place] [address], [place], [place] [address]    Gram Stain   Final    FEW SQUAMOUS EPITHELIAL CELLS PRESENT ABUNDANT WBC PRESENT,BOTH PMN AND MONONUCLEAR ABUNDANT GRAM POSITIVE COCCI FEW GRAM POSITIVE RODS FEW GRAM NEGATIVE RODS Performed at [facility], [place] [address], [place], [place] [address]    Culture   Final    ABUNDANT STAPHYLOCOCCUS AUREUS MODERATE STREPTOCOCCUS PNEUMONIAE    Report Status 10/09/2021 FINAL  Final   Organism ID, Bacteria STAPHYLOCOCCUS AUREUS  Final   Organism ID, Bacteria STREPTOCOCCUS PNEUMONIAE  Final      Susceptibility   Staphylococcus aureus - MIC*    CIPROFLOXACIN <=0.5 SENSITIVE Sensitive     ERYTHROMYCIN <=0.25 SENSITIVE Sensitive     GENTAMICIN <=0.5 SENSITIVE Sensitive     OXACILLIN 1 SENSITIVE Sensitive     TETRACYCLINE <=1 SENSITIVE Sensitive     VANCOMYCIN <=0.5 SENSITIVE Sensitive     TRIMETH/SULFA <=10 SENSITIVE Sensitive     CLINDAMYCIN <=0.25 SENSITIVE Sensitive     RIFAMPIN <=0.5 SENSITIVE Sensitive     Inducible Clindamycin NEGATIVE Sensitive     * ABUNDANT STAPHYLOCOCCUS AUREUS   Streptococcus pneumoniae - MIC*    ERYTHROMYCIN RESISTANT Resistant     LEVOFLOXACIN 1 SENSITIVE Sensitive     VANCOMYCIN 0.5 SENSITIVE Sensitive     PENICILLIN (meningitis) <=0.06 SENSITIVE Sensitive     PENO - penicillin <=0.06      PENICILLIN (non-meningitis) <=0.06 SENSITIVE Sensitive     PENICILLIN (oral) <=0.06 SENSITIVE Sensitive     CEFTRIAXONE (non-meningitis) <=0.12 SENSITIVE Sensitive     CEFTRIAXONE (meningitis) <=0.12 SENSITIVE Sensitive     * MODERATE STREPTOCOCCUS PNEUMONIAE  Culture, blood (routine x 2)     Status: None   Collection Time: 10/09/21  6:35 PM   Specimen: BLOOD  Result Value Ref Range Status   Specimen Description   Final  BLOOD LEFT ANTECUBITAL Performed at [place] [address] [place], [place] [address]    Special Requests   Final    BOTTLES DRAWN AEROBIC ONLY Blood  Culture results may not be optimal due to an inadequate volume of blood received in culture bottles Performed at [facility] [address], [place], [place] [address]    Culture   Final    NO GROWTH 5 DAYS Performed at [facility], [place] [address], [place], [place] [address]    Report Status 10/15/2021 FINAL  Final  Culture, blood (routine x 2)     Status: None   Collection Time: 10/09/21  6:35 PM   Specimen: BLOOD  Result Value Ref Range Status   Specimen Description   Final    BLOOD LEFT HAND Performed at [facility] [address], [place], [place] [address]    Special Requests   Final    BOTTLES DRAWN AEROBIC ONLY Blood Culture adequate volume Performed at [facility] [address] [place], [place] [address]    Culture   Final    NO GROWTH 5 DAYS Performed at [facility], [place] [address] [place], [place] [address]    Report Status 10/15/2021 FINAL  Final      Studies: No results found.  Scheduled Meds:  aspirin  81 mg Oral Daily   chlorhexidine  15 mL Mouth Rinse BID   enoxaparin (LOVENOX) injection  1 mg/kg Subcutaneous BID   feeding supplement (PROSource TF)  45 mL Per Tube TID   free water  200 mL Per Tube Q8H   levothyroxine  100 mcg Oral Q0600   mouth rinse  15 mL Mouth Rinse q12n4p    Continuous Infusions:  cefTRIAXone (ROCEPHIN)  IV 2 g (10/15/21 1040)   feeding supplement (OSMOLITE 1.5 CAL) 1,000 mL (10/13/21 1813)     LOS: 11 days     Kathie Dike, MD [facility]   If 7PM-7AM, please contact night-coverage [email or web address]  10/15/2021, 8:30 PM

## 2021-10-16 DIAGNOSIS — F0390 Unspecified dementia without behavioral disturbance: Secondary | ICD-10-CM | POA: Diagnosis not present

## 2021-10-16 DIAGNOSIS — U071 COVID-19: Secondary | ICD-10-CM | POA: Diagnosis not present

## 2021-10-16 DIAGNOSIS — G9340 Encephalopathy, unspecified: Secondary | ICD-10-CM | POA: Diagnosis not present

## 2021-10-16 LAB — CBC
HCT: 38.9 % — ABNORMAL LOW (ref 39.0–52.0)
Hemoglobin: 12.3 g/dL — ABNORMAL LOW (ref 13.0–17.0)
MCH: 29.7 pg (ref 26.0–34.0)
MCHC: 31.6 g/dL (ref 30.0–36.0)
MCV: 94 fL (ref 80.0–100.0)
Platelets: 374 10*3/uL (ref 150–400)
RBC: 4.14 MIL/uL — ABNORMAL LOW (ref 4.22–5.81)
RDW: 12.7 % (ref 11.5–15.5)
WBC: 9.2 10*3/uL (ref 4.0–10.5)
nRBC: 0.2 % (ref 0.0–0.2)

## 2021-10-16 LAB — GLUCOSE, CAPILLARY
Glucose-Capillary: 110 mg/dL — ABNORMAL HIGH (ref 70–99)
Glucose-Capillary: 71 mg/dL (ref 70–99)
Glucose-Capillary: 78 mg/dL (ref 70–99)
Glucose-Capillary: 87 mg/dL (ref 70–99)
Glucose-Capillary: 87 mg/dL (ref 70–99)
Glucose-Capillary: 94 mg/dL (ref 70–99)
Glucose-Capillary: 94 mg/dL (ref 70–99)

## 2021-10-16 NOTE — Progress Notes (Addendum)
PROGRESS NOTE  Ian Weber JHE:[id number] DOB: 08/10/1934 DOA: 10/04/2021 PCP: Jilda Panda, MD  HPI/Recap of past 24 hours: Ian Weber is a 88 y.o. male with medical history significant for dementia, hypertension, hypothyroidism, history of GI bleed who presents from home with concerns of increasing altered mental status and generalized weakness.  Associated with poor appetite and poor oral intake.  Was brought to the ED for further evaluation.  Recent COVID-19 positive 09/28/21.  Started on antiviral IV remdesivir.  Hospital course complicated by findings of B/L pulmonary embolism, started hep drip 10/05/21. Minimal responsiveness, restless agitation delirium reported early morning 12/30.    10/16/21: Patient is awake on my arrival.  He does move his arms and legs when asked to.  He has a bit of a stronger cough today.  Assessment/Plan: Principal Problem:   AMS (altered mental status) Active Problems:   Pressure injury of skin   COVID-19 virus infection   Elevated troponin   Dementia without behavioral disturbance (HCC)   HTN (hypertension)   Persistent acute metabolic encephalopathy, multifactorial secondary to COVID-19 viral infection and hypernatremia from poor oral intake, dehydration.  Covid 19+ 09/28/21 CT head nonacute, CT abdomen and pelvis with contrast nonacute. NG tube placement for hydration on 10/07/2021.  Continue free water flushing to avoid dehydration. No oral intake for several days, updated his wife at bedside.  She declines tube feeding on 10/09/2021.  After discussing with PCCM, family agreed to tube feeding, started 10/09/21 osmolite 1.2 cal trickle feeds.  Dietitian consulted. Ammonia, B12, TSH, MRI brain, Trop, all nonrevealing.  Blood cx x 2 peripherally, in process. Thiamine level in process, he has been receiving high-dose IV thiamine Patient seen by neurology EEG did not show any epileptiform discharges Lumbar puncture was attempted by radiology on 1/3,  but was unsuccessful after 4 attempts Suspicion is that his encephalopathy is related to COVID in the setting of dementia.  May also have a component of hospital delirium. His wife currently feels that his mental status is mildly better today She wishes to continue with current treatments and monitor for further improvement. I expressed that if he does not make meaningful improvements, would need to readdress goals of care and continued tube feeding. Prior to lumbar puncture attempts, he was started empirically on vancomycin, ceftriaxone, ampicillin and acyclovir Without CSF data, difficult to determine need for continued therapy Discussed with infectious disease, will continue total of 2 weeks of ceftriaxone 2 g every 12 hours (to be completed on 1/16), but will discontinue acyclovir, ampicillin and vancomycin for now and observe. -Currently, mental status appears to be very gradually improving.  He is currently at a point where he can make eye contact, follows some basic commands.  He is still not able to swallow.   COVID-19 viral infection with Staph aureus and Streptococcus pneumonia coinfection, POA Sputum culture obtained on 10/06/2021, returned positive for staph aureus and Streptococcus pneumoniae  Discussed with ID and will be treated with ceftriaxone  Newly diagnosed left lower lobe Staph aureus and Streptococcus community-acquired pneumonia, POA Management as stated above Monitor fever curve and WBC  Newly diagnosed bilateral acute pulmonary embolism Started on heparin drip on 10/05/2021, subsequently transition to Lovenox No evidence of right heart strain on CT scan.  Hematuria -Noted in condom catheter -Continue to monitor for now and follow hemoglobin.  Hemoglobin currently stable -If significant drop in hemoglobin occurs, would need to hold anticoagulation.  Left pleural effusion in setting of pneumonia Hold off IV  fluid hydration to avoid worsening of left pleural  effusion Continue IV antibiotics  Resolved hypovolemic hypernatremia in the setting of poor oral intake Free water flushes 200 cc every 6 hours. Hold off IV fluid hydration to avoid worsening left pleural effusion.  Acute hypoxic respiratory failure secondary to above Continue to maintain oxygen saturation greater than 92% Hypoxemia seen on ABG 10/06/2021  Elevated troponin in the setting of covid 19 infection and bilateral pulmonary embolism Trop S peaked at >4300 and down trended -Echocardiogram shows ejection fraction of 55 to 60% with no wall motion abnormalities. Continue to monitor on telemetry  Prolonged QTC QTC greater than 500 on 12 EKG done on 10/04/2021 Unable to obtain 12 EKG as the patient would not cooperate. Continue to optimize magnesium and potassium levels Continue to avoid QTC prolonging agents  Covid-19 viral infection, POA He received a course of remdesivir  Resolved post repletion: Hypokalemia K+ 3.2> 3.8 Continue to replete electrolytes as indicated.  Dementia Patient's daughter does confirm that he did have mild cognitive deficits prior to his current illness at baseline.   Continue fall/aspiration/delirium precautions  Goals of care Palliative care team consulted to assist with establishing goals of care Ongoing discussion of goals of care with family.  DNR as of 10/07/2021 -wife is insistent on taking patient home on discharge, but I am not sure that she realizes that he will be total care I spent time talking to her daughter Ivin Booty, and expressed my concerns about patient safety at home since he will require total care Today, patient's cousin was also in the room with his wife during my visit.  Cousin recounts her belief that there have been many miracles performed and patient's family and she is convinced that his recovery will also be a miracle.  After acknowledging their faith, I attempted to discuss how we should approach patient's care in the  event of possible persistent, long-term disability.  She did not wish to acknowledge this possibility and was certain that the patient would have a full recovery. Patient's wife appeared to agree with his cousins views.  She reiterated that she would prefer to take the patient home and inquired about possible private duty sitters.  We will asked TOC to provide his wife with further information on this.  Pressure Injury 10/04/21 Buttocks Bilateral Stage 2 -  Partial thickness loss of dermis presenting as a shallow open injury with a red, pink wound bed without slough. (Active) 10/04/21 2300 Location: Buttocks Location Orientation: Bilateral Staging: Stage 2 -  Partial thickness loss of dermis presenting as a shallow open injury with a red, pink wound bed without slough. Wound Description (Comments): Present on Admission:Yes    Code Status: DNR  Family Communication: Updated his wife at bedside.  Disposition Plan: His wife wishes to take him home.  We will asked TOC to provide information regarding private duty sitters since he will likely need extensive care   Consultants: Palliative care team Neurology   Procedures: 2D echo   Antimicrobials: Ceftriaxone 1/2 >1/16 Vancomycin 1/2 >1/5 Ampicillin 1/2 >1/5 Acyclovir 1/2 >1/5  DVT prophylaxis: Therapeutic lovenox  Status is: Inpatient  Patient requires at least 2 midnights for further evaluation and treatment of present conditions.        Objective: Vitals:   10/15/21 2059 10/16/21 0530 10/16/21 1304 10/16/21 1947  BP: 124/74 133/77  117/73  Pulse: 64 62  64  Resp: 17   16  Temp:  98.1 F (36.7 C) 97.9 F (36.6  C) 98.7 F (37.1 C)  TempSrc: Axillary Axillary Oral Axillary  SpO2: 100%   100%  Weight:      Height:        Intake/Output Summary (Last 24 hours) at 10/16/2021 2133 Last data filed at 10/16/2021 1950 Gross per 24 hour  Intake --  Output 2400 ml  Net -2400 ml   Filed Weights   10/10/21 2100  10/11/21 0445 10/15/21 0609  Weight: 73.6 kg 74.8 kg 72.6 kg    Exam:  General exam: Awake, no distress Respiratory system: Bilateral rhonchi. Respiratory effort normal. Cardiovascular system:RRR. No murmurs, rubs, gallops. Gastrointestinal system: Abdomen is nondistended, soft and nontender. No organomegaly or masses felt. Normal bowel sounds heard. Central nervous system: He moves all extremities when asked to.  No obvious deficits. Extremities: No C/C/E, +pedal pulses Skin: No rashes, lesions or ulcers Psychiatry: He is calm   Data Reviewed: CBC: Recent Labs  Lab 10/11/21 0641 10/12/21 0453 10/13/21 0214 10/14/21 0417 10/15/21 0426 10/16/21 0407  WBC 13.4* 13.8* 12.3* 9.8 8.3 9.2  NEUTROABS 11.9* 11.9* 10.4*  --   --   --   HGB 10.7* 11.2* 11.0* 11.6* 12.0* 12.3*  HCT 32.9* 35.9* 34.5* 36.5* 36.6* 38.9*  MCV 92.4 95.5 95.8 93.4 91.7 94.0  PLT 266 301 339 366 359 063   Basic Metabolic Panel: Recent Labs  Lab 10/10/21 1319 10/10/21 1705 10/11/21 0457 10/11/21 0641 10/11/21 1648 10/12/21 0453 10/13/21 0214 10/15/21 0426  NA  --   --   --  142  --  143 142 139  K  --   --   --  3.1*  --  4.0 3.8 4.4  CL  --   --   --  114*  --  113* 112* 107  CO2  --   --   --  24  --  23 25 27   GLUCOSE  --   --   --  108*  --  129* 123* 103*  BUN  --   --   --  11  --  11 10 7*  CREATININE  --   --   --  0.69  --  0.49* 0.61 0.55*  CALCIUM  --   --   --  7.1*  --  7.1* 7.0* 7.6*  MG 2.2 2.0 1.9  --  2.0 1.9  --   --   PHOS 2.3* 2.2* 2.2*  --  3.1 1.8*  --  2.9   GFR: Estimated Creatinine Clearance: 69.3 mL/min (A) (by C-G formula based on SCr of 0.55 mg/dL (L)). Liver Function Tests: Recent Labs  Lab 10/11/21 0641 10/12/21 0453 10/13/21 0214 10/15/21 0426  AST 21 22 32  --   ALT 17 17 22   --   ALKPHOS 52 48 53  --   BILITOT 0.6 0.4 0.4  --   PROT 5.1* 5.2* 5.2*  --   ALBUMIN 1.9* 1.7* 1.8* 2.0*   No results for input(s): LIPASE, AMYLASE in the last 168 hours. No  results for input(s): AMMONIA in the last 168 hours.  Coagulation Profile: No results for input(s): INR, PROTIME in the last 168 hours. Cardiac Enzymes: No results for input(s): CKTOTAL, CKMB, CKMBINDEX, TROPONINI in the last 168 hours. BNP (last 3 results) No results for input(s): PROBNP in the last 8760 hours. HbA1C: No results for input(s): HGBA1C in the last 72 hours. CBG: Recent Labs  Lab 10/16/21 0525 10/16/21 0759 10/16/21 1210 10/16/21 1721 10/16/21 1946  GLUCAP  71 87 94 110* 94   Lipid Profile: No results for input(s): CHOL, HDL, LDLCALC, TRIG, CHOLHDL, LDLDIRECT in the last 72 hours. Thyroid Function Tests: No results for input(s): TSH, T4TOTAL, FREET4, T3FREE, THYROIDAB in the last 72 hours.   Anemia Panel: No results for input(s): VITAMINB12, FOLATE, FERRITIN, TIBC, IRON, RETICCTPCT in the last 72 hours.  Urine analysis:    Component Value Date/Time   COLORURINE YELLOW 10/05/2021 0631   APPEARANCEUR HAZY (A) 10/05/2021 0631   LABSPEC 1.028 10/05/2021 0631   PHURINE 5.0 10/05/2021 0631   GLUCOSEU NEGATIVE 10/05/2021 0631   HGBUR SMALL (A) 10/05/2021 0631   BILIRUBINUR NEGATIVE 10/05/2021 0631   KETONESUR 5 (A) 10/05/2021 0631   PROTEINUR NEGATIVE 10/05/2021 0631   UROBILINOGEN 0.2 11/19/2012 1138   NITRITE NEGATIVE 10/05/2021 0631   LEUKOCYTESUR NEGATIVE 10/05/2021 0631   Sepsis Labs: @LABRCNTIP (procalcitonin:4,lacticidven:4)  ) Recent Results (from the past 240 hour(s))  Culture, blood (routine x 2)     Status: None   Collection Time: 10/09/21  6:35 PM   Specimen: BLOOD  Result Value Ref Range Status   Specimen Description   Final    BLOOD LEFT ANTECUBITAL Performed at [facility], [place] [address] [place], [place] [address]    Special Requests   Final    BOTTLES DRAWN AEROBIC ONLY Blood Culture results may not be optimal due to an inadequate volume of blood received in culture bottles Performed at [facility] [address] [place], [place] [address]    Culture   Final    NO GROWTH 5 DAYS Performed at [facility], [place] [address] [place], [place] [address]    Report Status 10/15/2021 FINAL  Final  Culture, blood (routine x 2)     Status: None   Collection Time: 10/09/21  6:35 PM   Specimen: BLOOD  Result Value Ref Range Status   Specimen Description   Final    BLOOD LEFT HAND Performed at [facility] [address] [place], [place] [address]    Special Requests   Final    BOTTLES DRAWN AEROBIC ONLY Blood Culture adequate volume Performed at [facility] [address], [place], [place] [address]    Culture   Final    NO GROWTH 5 DAYS Performed at [facility], [place] [address], [place],  [address]    Report Status 10/15/2021 FINAL  Final      Studies: No results found.  Scheduled Meds:  aspirin  81 mg Oral Daily   chlorhexidine  15 mL Mouth Rinse BID   enoxaparin (LOVENOX) injection  1 mg/kg Subcutaneous BID   feeding supplement (PROSource TF)  45 mL Per Tube TID   free water  200 mL Per Tube Q8H   levothyroxine  100 mcg Oral Q0600   mouth rinse  15 mL Mouth Rinse q12n4p    Continuous Infusions:  cefTRIAXone (ROCEPHIN)  IV 2 g (10/16/21 1009)   feeding supplement (OSMOLITE 1.5 CAL) 1,000 mL (10/16/21 1003)     LOS: 12 days     Kathie Dike, MD [facility]   If 7PM-7AM, please contact night-coverage [email or web address]  10/16/2021, 9:33 PM

## 2021-10-16 NOTE — Progress Notes (Signed)
[place] for enoxaparin Indication: acute pulmonary embolus  Allergies  Allergen Reactions   Amlodipine Other (See Comments)    Reaction not recalled by patient   Chlorthalidone Other (See Comments)    Reaction not recalled by patient   Codeine Phosphate Other (See Comments)    Weakness, sweating   Hydrochlorothiazide W-Triamterene Other (See Comments)    Reaction not recalled by patient   Prednisone Itching   Sulfamethoxazole Itching   Triamterene Other (See Comments)    Reaction not recalled by patient    Patient Measurements: Height: 6' (182.9 cm) Weight: 72.6 kg (160 lb 0.9 oz) IBW/kg (Calculated) : 77.6  Vital Signs: Temp: 98.1 F (36.7 C) (01/08 0530) Temp Source: Axillary (01/08 0530) BP: 133/77 (01/08 0530) Pulse Rate: 62 (01/08 0530)  Labs: Recent Labs    10/14/21 0417 10/15/21 0426 10/16/21 0407  HGB 11.6* 12.0* 12.3*  HCT 36.5* 36.6* 38.9*  PLT 366 359 374  CREATININE  --  0.55*  --      Estimated Creatinine Clearance: 69.3 mL/min (A) (by C-G formula based on SCr of 0.55 mg/dL (L)).   Assessment: Patient is an 88 y.o M who was recently diagnosed with COVID-19 presented to the ED on 10/04/21 with AMS. Troponin was found to be elevated, but cardiology does not think clinical picture is consistent with ACS.  D-dimer on 10/05/21 was found to be >20 and chest CTA came back positive for bilateral PE. Pharmacy initially consulted for IV heparin, then transitioned to SQ enoxaparin on 10/09/21. Enoxaparin held on 10/10/21 for planned LP on 10/11/21. Per notes, LP was unsuccessful despite total of 4 attempts. Pt transitioned back to therapeutic LMWH with no further procedures planned.   Today, 10/16/21 Patient remains NPO, unable to transition/consider oral anticoagulation Renal function WNL.  CBC: Hgb slightly low but stable; Plt WNL. Some hematuria noted on 1/7, MD monitoring Hgb.  Plan:  Continue enoxaparin 1 mg/kg subQ  q12h Recheck CBC with AM labs tomorrow  Lenis Noon, PharmD 10/16/21 9:14 AM

## 2021-10-16 NOTE — Progress Notes (Signed)
Urine clear pink tinged noted in canister, no clots. MD aware of color of urine. Will continue to monitor. SRP, RN.

## 2021-10-16 NOTE — Plan of Care (Signed)
  Problem: Coping: Goal: Psychosocial and spiritual needs will be supported Outcome: Progressing   Problem: Respiratory: Goal: Will maintain a patent airway Outcome: Progressing Goal: Complications related to the disease process, condition or treatment will be avoided or minimized Outcome: Progressing   

## 2021-10-17 DIAGNOSIS — U071 COVID-19: Secondary | ICD-10-CM | POA: Diagnosis not present

## 2021-10-17 DIAGNOSIS — F0390 Unspecified dementia without behavioral disturbance: Secondary | ICD-10-CM | POA: Diagnosis not present

## 2021-10-17 DIAGNOSIS — G9340 Encephalopathy, unspecified: Secondary | ICD-10-CM | POA: Diagnosis not present

## 2021-10-17 DIAGNOSIS — R4182 Altered mental status, unspecified: Secondary | ICD-10-CM | POA: Diagnosis not present

## 2021-10-17 LAB — GLUCOSE, CAPILLARY
Glucose-Capillary: 91 mg/dL (ref 70–99)
Glucose-Capillary: 99 mg/dL (ref 70–99)
Glucose-Capillary: 98 mg/dL (ref 70–99)
Glucose-Capillary: 101 mg/dL — ABNORMAL HIGH (ref 70–99)
Glucose-Capillary: 87 mg/dL (ref 70–99)

## 2021-10-17 LAB — CBC
HCT: 37.1 % — ABNORMAL LOW (ref 39.0–52.0)
Hemoglobin: 12.1 g/dL — ABNORMAL LOW (ref 13.0–17.0)
MCH: 30.1 pg (ref 26.0–34.0)
MCHC: 32.6 g/dL (ref 30.0–36.0)
MCV: 92.3 fL (ref 80.0–100.0)
Platelets: 370 10*3/uL (ref 150–400)
RBC: 4.02 MIL/uL — ABNORMAL LOW (ref 4.22–5.81)
RDW: 12.8 % (ref 11.5–15.5)
WBC: 10.6 10*3/uL — ABNORMAL HIGH (ref 4.0–10.5)
nRBC: 0 % (ref 0.0–0.2)

## 2021-10-17 NOTE — Plan of Care (Signed)
  Problem: Nutrition: Goal: Adequate nutrition will be maintained Outcome: Progressing   Problem: Skin Integrity: Goal: Risk for impaired skin integrity will decrease Outcome: Progressing   

## 2021-10-17 NOTE — TOC Progression Note (Signed)
Transition of Care [facility]) - Progression Note    Patient Details  Name: Ian Weber MRN: [id number] Date of Birth: 1934/04/07  Transition of Care [facility]) CM/SW Contact  Purcell Mouton, RN Phone Number: 10/17/2021, 3:51 PM  Clinical Narrative:    A call to pt's wife was made. [name] asked that I call her back.   Expected Discharge Plan: Post Barriers to Discharge: No Barriers Identified  Expected Discharge Plan and Services Expected Discharge Plan: [place] arrangements for the past 2 months: Single Family Home                                       Social Determinants of Health (SDOH) Interventions    Readmission Risk Interventions No flowsheet data found.

## 2021-10-17 NOTE — Care Management Important Message (Signed)
Important Message  Patient Details IM Letter placed in Patients room. Name: Ian Weber MRN: [id number] Date of Birth: 09-17-1934   Medicare Important Message Given:  Yes     Kerin Salen 10/17/2021, 2:10 PM

## 2021-10-17 NOTE — Progress Notes (Signed)
PROGRESS NOTE  [name] WNI:[id number] DOB: 28-Jul-1934 DOA: 10/04/2021 PCP: Jilda Panda, MD  HPI/Recap of past 24 hours: Ian Weber is a 88 y.o. male with medical history significant for dementia, hypertension, hypothyroidism, history of GI bleed who presents from home with concerns of increasing altered mental status and generalized weakness.  Associated with poor appetite and poor oral intake.  Was brought to the ED for further evaluation.  Recent COVID-19 positive 09/28/21.  Started on antiviral IV remdesivir.  Hospital course complicated by findings of B/L pulmonary embolism, started hep drip 10/05/21. Minimal responsiveness, restless agitation delirium reported early morning 12/30.    10/17/21: He is very somnolent today, he does not answer my questions, he does not follow commands.  Briefly wakes up to voice, but falls back asleep.  Assessment/Plan: Principal Problem:   AMS (altered mental status) Active Problems:   Pressure injury of skin   COVID-19 virus infection   Elevated troponin   Dementia without behavioral disturbance (HCC)   HTN (hypertension)   Persistent acute metabolic encephalopathy, multifactorial secondary to COVID-19 viral infection and hypernatremia from poor oral intake, dehydration.  Covid 19+ 09/28/21 CT head nonacute, CT abdomen and pelvis with contrast nonacute. NG tube placement for hydration on 10/07/2021.  Continue free water flushing to avoid dehydration. No oral intake for several days, updated his wife at bedside.  She declines tube feeding on 10/09/2021.  After discussing with PCCM, family agreed to tube feeding, started 10/09/21 osmolite 1.2 cal trickle feeds.  Dietitian consulted. Ammonia, B12, TSH, MRI brain, Trop, all nonrevealing.  Blood cx x 2 peripherally, in process. Thiamine level in process, he has been receiving high-dose IV thiamine Patient seen by neurology EEG did not show any epileptiform discharges Lumbar puncture was attempted  by radiology on 1/3, but was unsuccessful after 4 attempts Suspicion is that his encephalopathy is related to COVID in the setting of dementia.  May also have a component of hospital delirium. His wife currently feels that his mental status is mildly better today She wishes to continue with current treatments and monitor for further improvement. I expressed that if he does not make meaningful improvements, would need to readdress goals of care and continued tube feeding. Prior to lumbar puncture attempts, he was started empirically on vancomycin, ceftriaxone, ampicillin and acyclovir Without CSF data, difficult to determine need for continued therapy Discussed with infectious disease, will continue total of 2 weeks of ceftriaxone 2 g every 12 hours (to be completed on 1/16), but will discontinue acyclovir, ampicillin and vancomycin for now and observe. -Currently, mental status appears to be very gradually improving.  He is currently at a point where he can make eye contact, follows some basic commands.  He is still not able to swallow. -Discussed with wife that his NG tube has been replaced for 11 to 12 days, and at this point he would be reasonable to make a decision regarding PEG tube placement -She wishes to discuss this with the patient and we will reassess him tomorrow -If he continues to not be able to swallow and patient/family wish to proceed with PEG tube, will consult IR   COVID-19 viral infection with Staph aureus and Streptococcus pneumonia coinfection, POA Sputum culture obtained on 10/06/2021, returned positive for staph aureus and Streptococcus pneumoniae  Discussed with ID and will be treated with ceftriaxone  Newly diagnosed left lower lobe Staph aureus and Streptococcus community-acquired pneumonia, POA Management as stated above Monitor fever curve and WBC  Newly  diagnosed bilateral acute pulmonary embolism Started on heparin drip on 10/05/2021, subsequently transition to  Lovenox No evidence of right heart strain on CT scan.  Hematuria -Noted in condom catheter -Continue to monitor for now and follow hemoglobin.  Hemoglobin currently stable -If significant drop in hemoglobin occurs, would need to hold anticoagulation.  Left pleural effusion in setting of pneumonia Hold off IV fluid hydration to avoid worsening of left pleural effusion Continue IV antibiotics  Resolved hypovolemic hypernatremia in the setting of poor oral intake Free water flushes 200 cc every 6 hours. Hold off IV fluid hydration to avoid worsening left pleural effusion.  Acute hypoxic respiratory failure secondary to above Continue to maintain oxygen saturation greater than 92% Currently on room air Hypoxemia seen on ABG 10/06/2021  Elevated troponin in the setting of covid 19 infection and bilateral pulmonary embolism Trop S peaked at >4300 and down trended -Echocardiogram shows ejection fraction of 55 to 60% with no wall motion abnormalities. Continue to monitor on telemetry  Prolonged QTC QTC greater than 500 on 12 EKG done on 10/04/2021 Unable to obtain 12 EKG as the patient would not cooperate. Continue to optimize magnesium and potassium levels Continue to avoid QTC prolonging agents  Covid-19 viral infection, POA He received a course of remdesivir  Resolved post repletion: Hypokalemia K+ 3.2> 3.8 Continue to replete electrolytes as indicated.  Dementia Patient's daughter does confirm that he did have mild cognitive deficits prior to his current illness at baseline.   Continue fall/aspiration/delirium precautions  Goals of care Palliative care team consulted to assist with establishing goals of care Ongoing discussion of goals of care with family.  DNR as of 10/07/2021 -wife is insistent on taking patient home on discharge, but I am not sure that she realizes that he will be total care I spent time talking to her daughter Ivin Booty, and expressed my concerns about  patient safety at home since he will require total care Today, patient's cousin was also in the room with his wife during my visit.  Cousin recounts her belief that there have been many miracles performed and patient's family and she is convinced that his recovery will also be a miracle.  After acknowledging their faith, I attempted to discuss how we should approach patient's care in the event of possible persistent, long-term disability.  She did not wish to acknowledge this possibility and was certain that the patient would have a full recovery. Patient's wife appeared to agree with his cousins views.  She reiterated that she would prefer to take the patient home and inquired about possible private duty sitters.  We will asked TOC to provide his wife with further information on this.  Pressure Injury 10/04/21 Buttocks Bilateral Stage 2 -  Partial thickness loss of dermis presenting as a shallow open injury with a red, pink wound bed without slough. (Active) 10/04/21 2300 Location: Buttocks Location Orientation: Bilateral Staging: Stage 2 -  Partial thickness loss of dermis presenting as a shallow open injury with a red, pink wound bed without slough. Wound Description (Comments): Present on Admission:Yes    Code Status: DNR  Family Communication: Updated his wife at bedside.  Disposition Plan: His wife wishes to take him home.  We will asked TOC to provide information regarding private duty sitters since he will likely need extensive care   Consultants: Palliative care team Neurology   Procedures: 2D echo   Antimicrobials: Ceftriaxone 1/2 >1/15 Vancomycin 1/2 >1/5 Ampicillin 1/2 >1/5 Acyclovir 1/2 >1/5  DVT prophylaxis:  Therapeutic lovenox  Status is: Inpatient  Patient requires at least 2 midnights for further evaluation and treatment of present conditions.        Objective: Vitals:   10/16/21 1947 10/17/21 0409 10/17/21 1316 10/17/21 2018  BP: 117/73 127/73  109/73   Pulse: 64 72 70 71  Resp: 16 17 16    Temp: 98.7 F (37.1 C) 98 F (36.7 C) 98.1 F (36.7 C) 98.4 F (36.9 C)  TempSrc: Axillary Axillary Axillary Axillary  SpO2: 100% 100% 100%   Weight:      Height:        Intake/Output Summary (Last 24 hours) at 10/17/2021 2052 Last data filed at 10/17/2021 1706 Gross per 24 hour  Intake 100 ml  Output 2750 ml  Net -2650 ml   Filed Weights   10/10/21 2100 10/11/21 0445 10/15/21 0609  Weight: 73.6 kg 74.8 kg 72.6 kg    Exam:  General exam: Somnolent, briefly wakes up to voice but falls back asleep Respiratory system: Bilateral rhonchi. Respiratory effort normal. Cardiovascular system:RRR. No murmurs, rubs, gallops. Gastrointestinal system: Abdomen is nondistended, soft and nontender. No organomegaly or masses felt. Normal bowel sounds heard. Central nervous system:  No focal neurological deficits. Extremities: No C/C/E, +pedal pulses Skin: No rashes, lesions or ulcers Psychiatry: Somnolent    Data Reviewed: CBC: Recent Labs  Lab 10/11/21 0641 10/12/21 0453 10/13/21 0214 10/14/21 0417 10/15/21 0426 10/16/21 0407 10/17/21 0408  WBC 13.4* 13.8* 12.3* 9.8 8.3 9.2 10.6*  NEUTROABS 11.9* 11.9* 10.4*  --   --   --   --   HGB 10.7* 11.2* 11.0* 11.6* 12.0* 12.3* 12.1*  HCT 32.9* 35.9* 34.5* 36.5* 36.6* 38.9* 37.1*  MCV 92.4 95.5 95.8 93.4 91.7 94.0 92.3  PLT 266 301 339 366 359 374 626   Basic Metabolic Panel: Recent Labs  Lab 10/11/21 0457 10/11/21 0641 10/11/21 1648 10/12/21 0453 10/13/21 0214 10/15/21 0426  NA  --  142  --  143 142 139  K  --  3.1*  --  4.0 3.8 4.4  CL  --  114*  --  113* 112* 107  CO2  --  24  --  23 25 27   GLUCOSE  --  108*  --  129* 123* 103*  BUN  --  11  --  11 10 7*  CREATININE  --  0.69  --  0.49* 0.61 0.55*  CALCIUM  --  7.1*  --  7.1* 7.0* 7.6*  MG 1.9  --  2.0 1.9  --   --   PHOS 2.2*  --  3.1 1.8*  --  2.9   GFR: Estimated Creatinine Clearance: 69.3 mL/min (A) (by C-G formula  based on SCr of 0.55 mg/dL (L)). Liver Function Tests: Recent Labs  Lab 10/11/21 0641 10/12/21 0453 10/13/21 0214 10/15/21 0426  AST 21 22 32  --   ALT 17 17 22   --   ALKPHOS 52 48 53  --   BILITOT 0.6 0.4 0.4  --   PROT 5.1* 5.2* 5.2*  --   ALBUMIN 1.9* 1.7* 1.8* 2.0*   No results for input(s): LIPASE, AMYLASE in the last 168 hours. No results for input(s): AMMONIA in the last 168 hours.  Coagulation Profile: No results for input(s): INR, PROTIME in the last 168 hours. Cardiac Enzymes: No results for input(s): CKTOTAL, CKMB, CKMBINDEX, TROPONINI in the last 168 hours. BNP (last 3 results) No results for input(s): PROBNP in the last 8760 hours. HbA1C: No  results for input(s): HGBA1C in the last 72 hours. CBG: Recent Labs  Lab 10/17/21 0401 10/17/21 0738 10/17/21 1149 10/17/21 1651 10/17/21 2013  GLUCAP 91 99 98 101* 87   Lipid Profile: No results for input(s): CHOL, HDL, LDLCALC, TRIG, CHOLHDL, LDLDIRECT in the last 72 hours. Thyroid Function Tests: No results for input(s): TSH, T4TOTAL, FREET4, T3FREE, THYROIDAB in the last 72 hours.   Anemia Panel: No results for input(s): VITAMINB12, FOLATE, FERRITIN, TIBC, IRON, RETICCTPCT in the last 72 hours.  Urine analysis:    Component Value Date/Time   COLORURINE YELLOW 10/05/2021 0631   APPEARANCEUR HAZY (A) 10/05/2021 0631   LABSPEC 1.028 10/05/2021 0631   PHURINE 5.0 10/05/2021 0631   GLUCOSEU NEGATIVE 10/05/2021 0631   HGBUR SMALL (A) 10/05/2021 0631   BILIRUBINUR NEGATIVE 10/05/2021 0631   KETONESUR 5 (A) 10/05/2021 0631   PROTEINUR NEGATIVE 10/05/2021 0631   UROBILINOGEN 0.2 11/19/2012 1138   NITRITE NEGATIVE 10/05/2021 0631   LEUKOCYTESUR NEGATIVE 10/05/2021 0631   Sepsis Labs: @LABRCNTIP (procalcitonin:4,lacticidven:4)  ) Recent Results (from the past 240 hour(s))  Culture, blood (routine x 2)     Status: None   Collection Time: 10/09/21  6:35 PM   Specimen: BLOOD  Result Value Ref Range Status    Specimen Description   Final    BLOOD LEFT ANTECUBITAL Performed at [facility], [place] [address] [place], [place] [address]    Special Requests   Final    BOTTLES DRAWN AEROBIC ONLY Blood Culture results may not be optimal due to an inadequate volume of blood received in culture bottles Performed at [facility] [address], [place], [place] [address]    Culture   Final    NO GROWTH 5 DAYS Performed at [facility], [place] [address], [place], [place] [address]    Report Status 10/15/2021 FINAL  Final  Culture, blood (routine x 2)     Status: None   Collection Time: 10/09/21  6:35 PM   Specimen: BLOOD  Result Value Ref Range Status   Specimen Description   Final    BLOOD LEFT HAND Performed at [facility] [address], [place], [place] [address]    Special Requests   Final    BOTTLES DRAWN AEROBIC ONLY Blood Culture adequate volume Performed at [facility] [address], [place], [place] [address]    Culture   Final    NO GROWTH 5 DAYS Performed at [facility], [place] [address] [place], [place] [address]    Report Status 10/15/2021 FINAL  Final      Studies: No results found.  Scheduled Meds:  chlorhexidine  15 mL Mouth Rinse BID   enoxaparin (LOVENOX) injection  1 mg/kg Subcutaneous BID   feeding supplement (PROSource TF)  45 mL Per Tube TID   free water  200 mL Per Tube Q8H   levothyroxine  100 mcg Oral Q0600   mouth rinse  15 mL Mouth Rinse q12n4p    Continuous Infusions:  cefTRIAXone (ROCEPHIN)  IV 2 g (10/17/21 1240)   feeding supplement (OSMOLITE 1.5 CAL) 1,000 mL (10/16/21 1003)     LOS: 13 days     Kathie Dike, MD [facility]   If 7PM-7AM, please contact night-coverage [email or web address]  10/17/2021, 8:52 PM

## 2021-10-17 NOTE — Progress Notes (Signed)
Nutrition Follow-up  DOCUMENTATION CODES:   Not applicable  INTERVENTION:  - continue Osmolite 1.5 @ 50 ml/hr with 45 ml Prosource TF TID. - will alert RN to discrepancy between ordered free water flush and what is programmed into kangaroo pump. - monitor for ability to advance diet vs need to continue TF and consider more permanent access.     NUTRITION DIAGNOSIS:   Inadequate oral intake related to inability to eat as evidenced by NPO status. -ongoing  GOAL:   Patient will meet greater than or equal to 90% of their needs -met with TF regimen  MONITOR:   TF tolerance, Labs, Weight trends, Skin   ASSESSMENT:   88 yo male with a PMH of dementia, hypertension, hypothyroidism, history of GI bleed who presents with concerns of increasing altered mental status. He tested positive for COVID about 6 days ago and since then has had decreased appetite. Admitted with AMS.  Patient has been NPO since admission on 12/28. NGT placed in L nare on 12/29. Alerted MD that NGT has been in place x11 days and able to communicate via secure chat about plan moving forward.   Patient is receiving Osmolite 1.5 at goal rate of 50 ml/hr with 45 ml Prosource TF TID and 200 ml free water every 4 hours (order for free water flush is 200 ml TID since 1/3). This regimen is providing 1920 kcal, 108 grams protein, and 2117 ml free water. With ordered free water flush rather than what is running, he will be receiving 1517 ml/day.   He was last weighed on 1/7 and weight had been stable since 12/27, the day prior to admission.    Labs reviewed; CBGs: 91, 99, and 98 mg/dl, BMP on 1/7--BUN: 7 mg/dl, creatinine: 0.55 mg/dl, Ca: 7.6 mg/dl.  Medications reviewed; 100 mg synthroid/day, PRN imodium.   NUTRITION - FOCUSED PHYSICAL EXAM:  Flowsheet Row Most Recent Value  Orbital Region No depletion  Upper Arm Region Mild depletion  Thoracic and Lumbar Region Unable to assess  Buccal Region No depletion  Temple  Region Mild depletion  Clavicle Bone Region No depletion  Clavicle and Acromion Bone Region Mild depletion  Scapular Bone Region No depletion  Dorsal Hand Unable to assess  [bilateral mittens]  Patellar Region No depletion  Anterior Thigh Region No depletion  Posterior Calf Region Mild depletion  Edema (RD Assessment) None  Hair Reviewed  Eyes Unable to assess  [kept eyes closed throughout]  Mouth Unable to assess  Skin Reviewed  Nails Unable to assess  [bilateral mittens]       Diet Order:   Diet Order             Diet NPO time specified Except for: Ice Chips  Diet effective now                   EDUCATION NEEDS:   Not appropriate for education at this time  Skin:  Skin Assessment: Skin Integrity Issues: Skin Integrity Issues:: Stage III Stage III: Bilateral buttocks and sacral  Last BM:  1/6 (type 7)  Height:   Ht Readings from Last 1 Encounters:  10/04/21 6' (1.829 m)    Weight:   Wt Readings from Last 1 Encounters:  10/15/21 72.6 kg     Estimated Nutritional Needs:  Kcal:  1900-2100 Protein:  95-110 grams Fluid:  >1.9 L     Jarome Matin, MS, RD, LDN Inpatient Clinical Dietitian RD pager # available in AMION  After hours/weekend pager #  available in [facility]

## 2021-10-17 NOTE — Evaluation (Signed)
Physical Therapy Evaluation Patient Details Name: Ian Weber MRN: [id number] DOB: 30-Nov-1933 Today's Date: 10/17/2021  History of Present Illness  88 yo male admitted with persistent acute metabolic encephalopathy, Staph Pna, bil PE. Hx of dementia, PAF, bil LE edema  Clinical Impression  Bed level eval only. Pt required Total Assist +2 for attempt at bed mobility. Only able to partially sit up 2* pt resisting/pushing. Pt did not follow 1 step commands during session. Will plan to follow during hospital stay to continue to assess patient's ability to participate meaningfully with rehab in acute setting.      Recommendations for follow up therapy are one component of a multi-disciplinary discharge planning process, led by the attending physician.  Recommendations may be updated based on patient status, additional functional criteria and insurance authorization.  Follow Up Recommendations Skilled nursing-short term rehab (<3 hours/day)    Assistance Recommended at Discharge Frequent or constant Supervision/Assistance  Patient can return home with the following  Two people to help with bathing/dressing/bathroom;Two people to help with walking and/or transfers    [facility] bed;Wheelchair (measurements PT);BSC/3in1;Rolling walker (2 wheels)  Recommendations for Other Services       Functional Status Assessment Patient has had a recent decline in their functional status and/or demonstrates limited ability to make significant improvements in function in a reasonable and predictable amount of time     Precautions / Restrictions Precautions Precautions: Fall Precaution Comments: NG feeding tube, mittens Restrictions Weight Bearing Restrictions: No      Mobility  Bed Mobility Overal bed mobility: Needs Assistance Bed Mobility: Supine to Sit;Sit to Supine     Supine to sit: +2 for physical assistance;+2 for safety/equipment;Total assist Sit to supine: Total  assist;+2 for physical assistance;+2 for safety/equipment   General bed mobility comments: Only able to sit partially upright 2* pt resisting/pushing.    Transfers                        Ambulation/Gait                  Stairs            Wheelchair Mobility    Modified Rankin (Stroke Patients Only)       Balance Overall balance assessment: Needs assistance   Sitting balance-Leahy Scale: Zero                                       Pertinent Vitals/Pain Pain Assessment: Faces Faces Pain Scale: No hurt    Home Living Family/patient expects to be discharged to:: Unsure Living Arrangements: Spouse/significant other Available Help at Discharge: Family Type of Home: House Home Access: Stairs to enter Entrance Stairs-Rails: None Entrance Stairs-Number of Steps: 2   Home Layout: One level Home Equipment: None      Prior Function Prior Level of Function : Needs assist  Cognitive Assist : Mobility (cognitive);ADLs (cognitive)     Physical Assist : Mobility (physical);ADLs (physical)     Mobility Comments: ambulatory household distances-using RW most frequently (per wife)       Hand Dominance        Extremity/Trunk Assessment   Upper Extremity Assessment Upper Extremity Assessment: Defer to OT evaluation    Lower Extremity Assessment Lower Extremity Assessment: Difficult to assess due to impaired cognition       Communication   Communication: Receptive difficulties;Expressive difficulties  Cognition Arousal/Alertness: Lethargic (brief periods of alertness) Behavior During Therapy: Restless;Agitated (when alert, mildly agitated and restless) Overall Cognitive Status: History of cognitive impairments - at baseline                                 General Comments: Did not follow 1 step commands on today        General Comments      Exercises     Assessment/Plan    PT Assessment Patient needs  continued PT services  PT Problem List Decreased strength;Decreased activity tolerance;Decreased balance;Decreased mobility;Decreased cognition;Decreased safety awareness;Decreased knowledge of use of DME       PT Treatment Interventions Therapeutic activities;Balance training;Functional mobility training;Therapeutic exercise;Patient/family education;DME instruction;Gait training    PT Goals (Current goals can be found in the Care Plan section)  Acute Rehab PT Goals Patient Stated Goal: pt unable. wife plans to take pt back home PT Goal Formulation: With family Time For Goal Achievement: 10/31/21 Potential to Achieve Goals: Poor    Frequency Min 2X/week     Co-evaluation               AM-PAC PT "6 Clicks" Mobility  Outcome Measure Help needed turning from your back to your side while in a flat bed without using bedrails?: Total Help needed moving from lying on your back to sitting on the side of a flat bed without using bedrails?: Total Help needed moving to and from a bed to a chair (including a wheelchair)?: Total Help needed standing up from a chair using your arms (e.g., wheelchair or bedside chair)?: Total Help needed to walk in hospital room?: Total Help needed climbing 3-5 steps with a railing? : Total 6 Click Score: 6    End of Session   Activity Tolerance: Treatment limited secondary to agitation Patient left: in bed;with call bell/phone within reach;with family/visitor present (with mittens on and all 4 bedrails up)   PT Visit Diagnosis: Muscle weakness (generalized) (M62.81)    Time: 1641-1700 PT Time Calculation (min) (ACUTE ONLY): 19 min   Charges:   PT Evaluation $PT Eval Moderate Complexity: 1 Mod            Doreatha Massed, PT Acute Rehabilitation  Office: [phone] Pager: [phone]

## 2021-10-17 NOTE — TOC Progression Note (Signed)
Transition of Care [facility]) - Progression Note    Patient Details  Name: Ian Weber MRN: [id number] Date of Birth: [birth year]  Transition of Care [facility]) CM/SW Contact  Purcell Mouton, RN Phone Number: 10/17/2021, 4:07 PM  Clinical Narrative:     A return call was made to Mrs Rothe with no answer.   Expected Discharge Plan: [place] Barriers to Discharge: No Barriers Identified  Expected Discharge Plan and Services Expected Discharge Plan: [place] arrangements for the past 2 months: Single Family Home                                       Social Determinants of Health (SDOH) Interventions    Readmission Risk Interventions No flowsheet data found.

## 2021-10-18 DIAGNOSIS — U071 COVID-19: Secondary | ICD-10-CM | POA: Diagnosis not present

## 2021-10-18 DIAGNOSIS — Z7189 Other specified counseling: Secondary | ICD-10-CM | POA: Diagnosis not present

## 2021-10-18 DIAGNOSIS — R4182 Altered mental status, unspecified: Secondary | ICD-10-CM | POA: Diagnosis not present

## 2021-10-18 DIAGNOSIS — G9340 Encephalopathy, unspecified: Secondary | ICD-10-CM | POA: Diagnosis not present

## 2021-10-18 DIAGNOSIS — F0390 Unspecified dementia without behavioral disturbance: Secondary | ICD-10-CM | POA: Diagnosis not present

## 2021-10-18 LAB — GLUCOSE, CAPILLARY
Glucose-Capillary: 73 mg/dL (ref 70–99)
Glucose-Capillary: 75 mg/dL (ref 70–99)
Glucose-Capillary: 97 mg/dL (ref 70–99)
Glucose-Capillary: 106 mg/dL — ABNORMAL HIGH (ref 70–99)
Glucose-Capillary: 86 mg/dL (ref 70–99)
Glucose-Capillary: 107 mg/dL — ABNORMAL HIGH (ref 70–99)

## 2021-10-18 LAB — CBC
HCT: 38.4 % — ABNORMAL LOW (ref 39.0–52.0)
Hemoglobin: 12.2 g/dL — ABNORMAL LOW (ref 13.0–17.0)
MCH: 29.9 pg (ref 26.0–34.0)
MCHC: 31.8 g/dL (ref 30.0–36.0)
MCV: 94.1 fL (ref 80.0–100.0)
Platelets: 359 10*3/uL (ref 150–400)
RBC: 4.08 MIL/uL — ABNORMAL LOW (ref 4.22–5.81)
RDW: 13.3 % (ref 11.5–15.5)
WBC: 9.5 10*3/uL (ref 4.0–10.5)
nRBC: 0 % (ref 0.0–0.2)

## 2021-10-18 LAB — BASIC METABOLIC PANEL
Anion gap: 3 — ABNORMAL LOW (ref 5–15)
BUN: 12 mg/dL (ref 8–23)
CO2: 26 mmol/L (ref 22–32)
Calcium: 8 mg/dL — ABNORMAL LOW (ref 8.9–10.3)
Chloride: 107 mmol/L (ref 98–111)
Creatinine, Ser: 0.62 mg/dL (ref 0.61–1.24)
GFR, Estimated: >60 mL/min (ref >60)
Glucose, Bld: 79 mg/dL (ref 70–99)
Potassium: 4.7 mmol/L (ref 3.5–5.1)
Sodium: 136 mmol/L (ref 135–145)

## 2021-10-18 NOTE — Progress Notes (Signed)
Speech Language Pathology Treatment: Dysphagia  Patient Details Name: Ian Weber MRN: [id number] DOB: 1934-08-27 Today's Date: 10/18/2021 Time: 1740-8144 SLP Time Calculation (min) (ACUTE ONLY): 63 min  Assessment / Plan / Recommendation Clinical Impression  Today is following some directions with this SLP with total cues.  Significant amount of oral care provided with SLP removing viscous yellow tinged dried secretions from hard and soft palate with tootbrush- paste and oral suction.  Pt required overall max cues to seal lips on suction and open his mouth.    Wife present during entire session and educated as session progressed.  After oral care, provided pt with po ice chips x2.  Pt masticated tiny ice chips for over 45 seconds requiring verbal cues to swallow.  Wet phonation/breathing quality noted clinically and increased with po intake.  Reflexive cough assisted to decrease wet voice and allowed SLP to suction more secretions *presumed mobilized with ice from pharynx.  Pt then advised he did not want any po intake - even if he felt hungry - he said he would not want to eat/drink.  Pt remains severely dysarthric but yes/no understood.    Per lengthy discusson with pt's wife, Annamary Carolin, pt has h/o dysphagia since his thyroid surgery with occasional wet voice, deep inhalation post-swallow - concerning for aspiration, poor intake and dysarthriai prior to admit. She advised pt does not want feeding tube - and SLP encouraged her to examine research/speak to her MDs re: outcomes of PEG tubes in pt's with cognitive deficits (over the age of 25) as pt fits within this population.  High level of care with lack of defnitive benefit - including ongoing aspiration- reviewed.    Comfort po in small amounts reviewed as alternative if pt's wife agrees to plan.  Advised comfort po wuld be few bites/sips - ceasing po if pt coughing, having difficulty, etc and change of focus from nutritional benefit to enjoyment.   Pt's wife advised that she is leaning toward not wanting pt to have PEG tube - Will follow up re: potential care plan.  Reviewed importance of oral care for hygeine and wife reports she is comfortable with use of suction if needed.  Thanks.    HPI HPI: Ian Weber is a 88 yo male with a PMH of dementia, hypertension, hypothyroidism, history of GI bleed who presents from home with concerns for increasing altered mental status, poor appetite and poor PO intake. He tested positive for COVID about six days PTA. Dx acute metabolic encephalopathy secondary to COVID-19, hypernatremia, dehydration.  Pt has undergone BSE with recommendations for NPO x ice chips.  He is s/p small bore feeding tube - and at this time, discussion has been ongoing re: potential PEG.  SLP follow up regarding dysphagia management indicated.      SLP Plan  Continue with current plan of care      Recommendations for follow up therapy are one component of a multi-disciplinary discharge planning process, led by the attending physician.  Recommendations may be updated based on patient status, additional functional criteria and insurance authorization.    Recommendations  Diet recommendations: NPO (single small ice chips after oral care) Medication Administration: Via alternative means                Oral Care Recommendations: Oral care QID Follow Up Recommendations: Skilled nursing-short term rehab (<3 hours/day) Assistance recommended at discharge: Frequent or constant Supervision/Assistance SLP Visit Diagnosis: Dysphagia, unspecified (R13.10);Dysphagia, pharyngoesophageal phase (R13.14);Dysphagia, oral phase (R13.11) Plan: Continue  with current plan of care         Kathleen Lime, [place] SLP [place] Office [phone] Cell [phone]   Macario Golds  10/18/2021, 12:18 PM

## 2021-10-18 NOTE — Progress Notes (Signed)
PROGRESS NOTE  Ian Weber LNL:[id number] DOB: Apr 27, 1934 DOA: 10/04/2021 PCP: Jilda Panda, MD  HPI/Recap of past 24 hours: Ian Weber is a 88 y.o. male with medical history significant for dementia, hypertension, hypothyroidism, history of GI bleed who presents from home with concerns of increasing altered mental status and generalized weakness.  Associated with poor appetite and poor oral intake.  Was brought to the ED for further evaluation.  Recent COVID-19 positive 09/28/21.  Started on antiviral IV remdesivir.  Hospital course complicated by findings of B/L pulmonary embolism, started hep drip 10/05/21. Minimal responsiveness, restless agitation delirium reported early morning 12/30.  Mental status has failed to make meaningful improvements and he continues to have significant dysphagia.  He is unable to swallow without aspiration.  After several discussions with the patient's wife and daughter, his wife has elected to proceed with referral to residential hospice, [facility]  10/18/21: He is awake today.  Difficult to comprehend speech.  He was seen by speech therapy, and unfortunately was unable to swallow  Assessment/Plan: Principal Problem:   AMS (altered mental status) Active Problems:   Pressure injury of skin   COVID-19 virus infection   Elevated troponin   Dementia without behavioral disturbance (HCC)   HTN (hypertension)   Persistent acute metabolic encephalopathy, multifactorial secondary to COVID-19 viral infection and hypernatremia from poor oral intake, dehydration.  Covid 19+ 09/28/21 CT head nonacute, CT abdomen and pelvis with contrast nonacute. NG tube placement for hydration on 10/07/2021.  Continue free water flushing to avoid dehydration. No oral intake for several days, updated his wife at bedside.  She declines tube feeding on 10/09/2021.  After discussing with PCCM, family agreed to tube feeding, started 10/09/21 osmolite 1.2 cal trickle feeds.  Dietitian  consulted. Ammonia, B12, TSH, MRI brain, Trop, all nonrevealing.  Blood cx x 2 peripherally, in process. Thiamine level in process, he has been receiving high-dose IV thiamine Patient seen by neurology EEG did not show any epileptiform discharges Lumbar puncture was attempted by radiology on 1/3, but was unsuccessful after 4 attempts Suspicion is that his encephalopathy is related to COVID in the setting of dementia.  May also have a component of hospital delirium. His wife currently feels that his mental status is mildly better today She wishes to continue with current treatments and monitor for further improvement. I expressed that if he does not make meaningful improvements, would need to readdress goals of care and continued tube feeding. Prior to lumbar puncture attempts, he was started empirically on vancomycin, ceftriaxone, ampicillin and acyclovir Without CSF data, difficult to determine need for continued therapy Discussed with infectious disease, will continue total of 2 weeks of ceftriaxone 2 g every 12 hours (to be completed on 1/16), but will discontinue acyclovir, ampicillin and vancomycin for now and observe. -Currently, mental status appears to be very gradually improving.  He is currently at a point where he can make eye contact, follows some basic commands.  He is still not able to swallow. -He was reevaluated by speech therapy today continues to have significant dysphagia, unable to swallow.  He still has significant encephalopathy. -Discussed with wife the option of PEG tube, but she reports that the patient would not want to have a long-term PEG tube -She is electing referral to residential hospice after discussing with palliative care.   COVID-19 viral infection with Staph aureus and Streptococcus pneumonia coinfection, POA Sputum culture obtained on 10/06/2021, returned positive for staph aureus and Streptococcus pneumoniae  Discussed  with ID and will be treated with  ceftriaxone  Newly diagnosed left lower lobe Staph aureus and Streptococcus community-acquired pneumonia, POA Management as stated above Monitor fever curve and WBC  Newly diagnosed bilateral acute pulmonary embolism Started on heparin drip on 10/05/2021, subsequently transition to Lovenox No evidence of right heart strain on CT scan.  Hematuria -Noted in condom catheter -Continue to monitor for now and follow hemoglobin.  Hemoglobin currently stable -If significant drop in hemoglobin occurs, would need to hold anticoagulation.  Left pleural effusion in setting of pneumonia Hold off IV fluid hydration to avoid worsening of left pleural effusion Continue IV antibiotics  Resolved hypovolemic hypernatremia in the setting of poor oral intake Free water flushes 200 cc every 6 hours. Hold off IV fluid hydration to avoid worsening left pleural effusion.  Acute hypoxic respiratory failure secondary to above Continue to maintain oxygen saturation greater than 92% Currently on room air Hypoxemia seen on ABG 10/06/2021  Elevated troponin in the setting of covid 19 infection and bilateral pulmonary embolism Trop S peaked at >4300 and down trended -Echocardiogram shows ejection fraction of 55 to 60% with no wall motion abnormalities. Continue to monitor on telemetry  Prolonged QTC QTC greater than 500 on 12 EKG done on 10/04/2021 Unable to obtain 12 EKG as the patient would not cooperate. Continue to optimize magnesium and potassium levels Continue to avoid QTC prolonging agents  Covid-19 viral infection, POA He received a course of remdesivir  Resolved post repletion: Hypokalemia K+ 3.2> 3.8 Continue to replete electrolytes as indicated.  Dementia Patient's daughter does confirm that he did have mild cognitive deficits prior to his current illness at baseline.   Continue fall/aspiration/delirium precautions  Goals of care Palliative care team consulted to assist with  establishing goals of care Ongoing discussion of goals of care with family.  DNR as of 10/07/2021 -After several conversations with his wife and his daughter, due to his lack of progress, persistent encephalopathy and dysphagia, it was recommended that NG tube be discontinued -PEG tube was discussed, but patient's wife reports that he would not want to have a PEG tube long-term. -After discussing with palliative care, his wife is agreeable to residential hospice referral  Pressure Injury 10/04/21 Buttocks Bilateral Stage 2 -  Partial thickness loss of dermis presenting as a shallow open injury with a red, pink wound bed without slough. (Active) 10/04/21 2300 Location: Buttocks Location Orientation: Bilateral Staging: Stage 2 -  Partial thickness loss of dermis presenting as a shallow open injury with a red, pink wound bed without slough. Wound Description (Comments): Present on Admission:Yes    Code Status: DNR  Family Communication: Updated his wife at bedside.  Disposition Plan: His wife has agreed to referral to residential hospice, [facility]   Consultants: Palliative care team Neurology   Procedures: 2D echo   Antimicrobials: Ceftriaxone 1/2 >1/15 Vancomycin 1/2 >1/5 Ampicillin 1/2 >1/5 Acyclovir 1/2 >1/5  DVT prophylaxis: Therapeutic lovenox  Status is: Inpatient  Patient requires at least 2 midnights for further evaluation and treatment of present conditions.        Objective: Vitals:   10/18/21 0500 10/18/21 1416 10/18/21 2000 10/18/21 2016  BP:  118/71 118/80   Pulse:  67    Resp:  16 15   Temp:  98.3 F (36.8 C)  99.2 F (37.3 C)  TempSrc:  Axillary  Axillary  SpO2:      Weight: 106.3 kg     Height:  Intake/Output Summary (Last 24 hours) at 10/18/2021 2258 Last data filed at 10/18/2021 1500 Gross per 24 hour  Intake 1750 ml  Output 700 ml  Net 1050 ml   Filed Weights   10/11/21 0445 10/15/21 0609 10/18/21 0500  Weight: 74.8 kg  72.6 kg 106.3 kg    Exam:  General exam: Awake, no distress Respiratory system: Bilateral rhonchi. Respiratory effort normal. Cardiovascular system:RRR. No murmurs, rubs, gallops. Gastrointestinal system: Abdomen is nondistended, soft and nontender. No organomegaly or masses felt. Normal bowel sounds heard. Central nervous system:  No focal neurological deficits. Extremities: No C/C/E, +pedal pulses Skin: No rashes, lesions or ulcers Psychiatry: Unable to comprehend speech t   Data Reviewed: CBC: Recent Labs  Lab 10/12/21 0453 10/13/21 0214 10/14/21 0417 10/15/21 0426 10/16/21 0407 10/17/21 0408 10/18/21 0345  WBC 13.8* 12.3* 9.8 8.3 9.2 10.6* 9.5  NEUTROABS 11.9* 10.4*  --   --   --   --   --   HGB 11.2* 11.0* 11.6* 12.0* 12.3* 12.1* 12.2*  HCT 35.9* 34.5* 36.5* 36.6* 38.9* 37.1* 38.4*  MCV 95.5 95.8 93.4 91.7 94.0 92.3 94.1  PLT 301 339 366 359 374 370 299   Basic Metabolic Panel: Recent Labs  Lab 10/12/21 0453 10/13/21 0214 10/15/21 0426 10/18/21 0345  NA 143 142 139 136  K 4.0 3.8 4.4 4.7  CL 113* 112* 107 107  CO2 23 25 27 26   GLUCOSE 129* 123* 103* 79  BUN 11 10 7* 12  CREATININE 0.49* 0.61 0.55* 0.62  CALCIUM 7.1* 7.0* 7.6* 8.0*  MG 1.9  --   --   --   PHOS 1.8*  --  2.9  --    GFR: Estimated Creatinine Clearance: 85.1 mL/min (by C-G formula based on SCr of 0.62 mg/dL). Liver Function Tests: Recent Labs  Lab 10/12/21 0453 10/13/21 0214 10/15/21 0426  AST 22 32  --   ALT 17 22  --   ALKPHOS 48 53  --   BILITOT 0.4 0.4  --   PROT 5.2* 5.2*  --   ALBUMIN 1.7* 1.8* 2.0*   No results for input(s): LIPASE, AMYLASE in the last 168 hours. No results for input(s): AMMONIA in the last 168 hours.  Coagulation Profile: No results for input(s): INR, PROTIME in the last 168 hours. Cardiac Enzymes: No results for input(s): CKTOTAL, CKMB, CKMBINDEX, TROPONINI in the last 168 hours. BNP (last 3 results) No results for input(s): PROBNP in the last 8760  hours. HbA1C: No results for input(s): HGBA1C in the last 72 hours. CBG: Recent Labs  Lab 10/18/21 0431 10/18/21 0731 10/18/21 1113 10/18/21 1652 10/18/21 2013  GLUCAP 75 97 106* 86 107*   Lipid Profile: No results for input(s): CHOL, HDL, LDLCALC, TRIG, CHOLHDL, LDLDIRECT in the last 72 hours. Thyroid Function Tests: No results for input(s): TSH, T4TOTAL, FREET4, T3FREE, THYROIDAB in the last 72 hours.   Anemia Panel: No results for input(s): VITAMINB12, FOLATE, FERRITIN, TIBC, IRON, RETICCTPCT in the last 72 hours.  Urine analysis:    Component Value Date/Time   COLORURINE YELLOW 10/05/2021 0631   APPEARANCEUR HAZY (A) 10/05/2021 0631   LABSPEC 1.028 10/05/2021 0631   PHURINE 5.0 10/05/2021 0631   GLUCOSEU NEGATIVE 10/05/2021 0631   HGBUR SMALL (A) 10/05/2021 0631   BILIRUBINUR NEGATIVE 10/05/2021 0631   KETONESUR 5 (A) 10/05/2021 0631   PROTEINUR NEGATIVE 10/05/2021 0631   UROBILINOGEN 0.2 11/19/2012 1138   NITRITE NEGATIVE 10/05/2021 0631   LEUKOCYTESUR NEGATIVE 10/05/2021 0631  Sepsis Labs: @LABRCNTIP (procalcitonin:4,lacticidven:4)  ) Recent Results (from the past 240 hour(s))  Culture, blood (routine x 2)     Status: None   Collection Time: 10/09/21  6:35 PM   Specimen: BLOOD  Result Value Ref Range Status   Specimen Description   Final    BLOOD LEFT ANTECUBITAL Performed at [facility] [address] [place], [place] [address]    Special Requests   Final    BOTTLES DRAWN AEROBIC ONLY Blood Culture results may not be optimal due to an inadequate volume of blood received in culture bottles Performed at [facility] [address], [place], [place] [address]    Culture   Final    NO GROWTH 5 DAYS Performed at [facility], [place] [address] [place], [place] [address]    Report Status 10/15/2021 FINAL  Final  Culture, blood (routine x 2)     Status: None   Collection Time: 10/09/21  6:35 PM   Specimen:  BLOOD  Result Value Ref Range Status   Specimen Description   Final    BLOOD LEFT HAND Performed at [facility] [address], [place], [place] [address]    Special Requests   Final    BOTTLES DRAWN AEROBIC ONLY Blood Culture adequate volume Performed at [facility] [address], [place], [place] [address]    Culture   Final    NO GROWTH 5 DAYS Performed at [facility], [place] [address] [place], [place] [address]    Report Status 10/15/2021 FINAL  Final      Studies: No results found.  Scheduled Meds:  chlorhexidine  15 mL Mouth Rinse BID   enoxaparin (LOVENOX) injection  1 mg/kg Subcutaneous BID   feeding supplement (PROSource TF)  45 mL Per Tube TID   free water  200 mL Per Tube Q8H   levothyroxine  100 mcg Oral Q0600   mouth rinse  15 mL Mouth Rinse q12n4p    Continuous Infusions:  cefTRIAXone (ROCEPHIN)  IV 2 g (10/18/21 2111)   feeding supplement (OSMOLITE 1.5 CAL) 1,000 mL (10/16/21 1003)     LOS: 14 days     Kathie Dike, MD [facility]   If 7PM-7AM, please contact night-coverage [email or web address]  10/18/2021, 10:58 PM

## 2021-10-18 NOTE — Progress Notes (Signed)
OT Cancellation Note  Patient Details Name: Ian Weber MRN: [id number] DOB: 1934/05/09   Cancelled Treatment:    Reason Eval/Treat Not Completed: Other (comment) OT eval received, per note 1/2 patient is total A for all ADLs. PT eval completed 1/9 patient +2 and not following 1 step directions. Per MD note 1/9 "spouse reiterated that she would prefer to take the patient home and inquired about possible private duty sitters.  We will asked TOC to provide his wife with further information on this." No acute OT needs at this time. Will sign off.  Delbert Phenix OT OT pager: [phone]   Rosemary Holms 10/18/2021, 6:40 AM

## 2021-10-18 NOTE — Progress Notes (Signed)
Patient did wake up, open eyes, and tell me his name and his bday this AM.  He was also able to lift each arm as I asked.  However, unintelligible responses to other questions, and then pt fell back asleep.  Wife is at bedside.

## 2021-10-18 NOTE — Progress Notes (Signed)
Daily Progress Note   Patient Name: Ian Weber       Date: 10/18/2021 DOB: [birth year]  Age: 88 y.o. MRN#: [id number] Attending Physician: Kathie Dike, MD Primary Care Physician: Jilda Panda, MD Admit Date: 10/04/2021  Reason for Consultation/Follow-up: Establishing goals of care  Subjective: I saw and examined Mr. Ian Weber today.  He was lying in bed and slept throughout my encounter today.  I met with his wife at the bedside.  We discussed his clinical course this admission and continued concern about aspiration.  We discussed the fact that he is not eating and drinking nor does he desire to eat and drink.  He had short-term feeding tube placed, however, he continues to have otherwise no intake for nutrition and hydration.  We discussed again and she is clear that he would not want a long-term feeding tube.  Discussed with her that, in light of this, we are at a point where he is approaching end-of-life.  We discussed goals moving forward of ensuring that he is not suffering and focusing on allowing him to feel as well as he can for what ever time he has left.  We discussed residential hospice and she tells me that her daughter had expressed that she thinks that he would be well served to go to [employer].  She tells me that her daughter worked at [employer] as a Psychologist, occupational for a period of time and therefore she is familiar with type of care that they provide.  We discussed care moving forward would be focused on interventions to relieve any symptoms or suffering but would not focus on trying to fix any problems that are long-term not fixable.  Discussed that I felt that his prognosis, if plan is to forego further artificial nutrition and hydration, would be less than 2 weeks.  When  I asked again her thoughts on next steps moving forward, she was in agreement with referral for residential hospice.  Length of Stay: 14  Current Medications: Scheduled Meds:   chlorhexidine  15 mL Mouth Rinse BID   enoxaparin (LOVENOX) injection  1 mg/kg Subcutaneous BID   feeding supplement (PROSource TF)  45 mL Per Tube TID   free water  200 mL Per Tube Q8H   levothyroxine  100 mcg Oral Q0600   mouth rinse  15 mL Mouth Rinse q12n4p    Continuous Infusions:  cefTRIAXone (ROCEPHIN)  IV 2 g (10/18/21 0949)   feeding supplement (OSMOLITE 1.5 CAL) 1,000 mL (10/16/21 1003)    PRN Meds: levalbuterol, lip balm, liver oil-zinc oxide, loperamide  Physical Exam   General: Somnolent.  Copious secretions. Heart: Regular rate and rhythm. No murmur appreciated. Lungs: Coarse throughout Abdomen: Soft, nontender, nondistended, positive bowel sounds.   Ext: No significant edema        Vital Signs: BP 118/71 (BP Location: Left Arm)    Pulse 67    Temp 98.3 F (36.8 C) (Axillary)    Resp 16    Ht 6' (1.829 m)    Wt 106.3 kg    SpO2 100%    BMI 31.78 kg/m  SpO2: SpO2: 100 % O2 Device: O2 Device: Room Air O2 Flow Rate: O2 Flow Rate (L/min): 2 L/min  Intake/output summary:  Intake/Output Summary (Last 24 hours) at 10/18/2021 1559 Last data filed at 10/18/2021 1500 Gross per 24 hour  Intake 1750 ml  Output 1650 ml  Net 100 ml    LBM: Last BM Date: 10/14/21 Baseline Weight: Weight: 72.6 kg Most recent weight: Weight: 106.3 kg       Palliative Assessment/Data:      Patient Active Problem List   Diagnosis Date Noted   Pressure injury of skin 10/05/2021   COVID-19 virus infection 10/05/2021   Elevated troponin 10/05/2021   Dementia without behavioral disturbance ([place]) 10/05/2021   HTN (hypertension) 10/05/2021   AMS (altered mental status) 10/04/2021   Anxiety 09/24/2017   GIB (gastrointestinal bleeding) 09/24/2017   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   Constipation 01/02/2012    BRBPR (bright red blood per rectum) 01/02/2012   VITAMIN D DEFICIENCY 10/22/2009   Diverticulosis of colon with hemorrhage 10/22/2009   ABDOMINAL PAIN, EPIGASTRIC 10/22/2009   THYROID CANCER, HX OF 10/22/2009   HYPOTHYROIDISM, POSTSURGICAL 10/20/2009   Essential hypertension 10/20/2009   ESOPHAGEAL STRICTURE 10/20/2009   GERD 10/20/2009   HIATAL HERNIA 10/20/2009    Palliative Care Assessment & Plan   Patient Profile: 88 year old male with past medical history of dementia, hypertension, hypothyroidism, GI bleed admitted with weakness, altered mental status, and excess secretions with increased work of breathing in light of known COVID-19 infection.  Recommendations/Plan: DNR/DNI No feeding tube moving forward.  Discussed that in light of his lack of nutrition and hydration and plan to avoid PEG, we are approaching end-of-life. Recommendation for consideration for residential hospice patient's wife was amenable to this today.  She reports that her daughter used to work at [employer] as a Psychologist, occupational and felt that it would be the best place for him moving forward.   Care plan was discussed with wife  Thank you for allowing the Palliative Medicine Team to assist in the care of this patient.   Time In: 1240 Time Out: 1330 Total Time 50 Prolonged Time Billed No   Jinelle Butchko Domingo Cocking, MD  Please contact Palliative Medicine Team phone at [phone] for questions and concerns.

## 2021-10-19 LAB — GLUCOSE, CAPILLARY
Glucose-Capillary: 108 mg/dL — ABNORMAL HIGH (ref 70–99)
Glucose-Capillary: 110 mg/dL — ABNORMAL HIGH (ref 70–99)
Glucose-Capillary: 112 mg/dL — ABNORMAL HIGH (ref 70–99)
Glucose-Capillary: 113 mg/dL — ABNORMAL HIGH (ref 70–99)
Glucose-Capillary: 117 mg/dL — ABNORMAL HIGH (ref 70–99)

## 2021-10-19 NOTE — Progress Notes (Signed)
[place] for enoxaparin Indication: acute pulmonary embolus  Allergies  Allergen Reactions   Amlodipine Other (See Comments)    Reaction not recalled by patient   Chlorthalidone Other (See Comments)    Reaction not recalled by patient   Codeine Phosphate Other (See Comments)    Weakness, sweating   Hydrochlorothiazide W-Triamterene Other (See Comments)    Reaction not recalled by patient   Prednisone Itching   Sulfamethoxazole Itching   Triamterene Other (See Comments)    Reaction not recalled by patient    Patient Measurements: Height: 6' (182.9 cm) Weight: 106.8 kg (235 lb 7.2 oz) IBW/kg (Calculated) : 77.6  Vital Signs: Temp: 97.8 F (36.6 C) (01/11 0400) Temp Source: Axillary (01/11 0400) BP: 129/66 (01/11 0400)  Labs: Recent Labs    10/17/21 0408 10/18/21 0345  HGB 12.1* 12.2*  HCT 37.1* 38.4*  PLT 370 359  CREATININE  --  0.62    Estimated Creatinine Clearance: 85.3 mL/min (by C-G formula based on SCr of 0.62 mg/dL).  Assessment: Patient is an 88 y.o M who was recently diagnosed with COVID-19 presented to the ED on 10/04/21 with AMS. Troponin was found to be elevated, but cardiology does not think clinical picture is consistent with ACS.  D-dimer on 10/05/21 was found to be >20 and chest CTA came back positive for bilateral PE. Pharmacy initially consulted for IV heparin, then transitioned to SQ enoxaparin on 10/09/21. Enoxaparin held on 10/10/21 for planned LP on 10/11/21. Per notes, LP was unsuccessful despite total of 4 attempts. Pt transitioned back to therapeutic LMWH with no further procedures planned.   Today, 10/19/21 Patient remains NPO, unable to transition/consider oral anticoagulation Renal function WNL.  CBC: Hgb slightly low - improved; Plt WNL. Some hematuria noted on 1/7, MD monitoring Hgb. Note that initial TBW charted as 74.8kg > 72.6 kg > 106.3 & 106.8 kg RN notes that pt does not appear over 200 lb,  currently need to use bed scale, will continue using initial weight as dosing weight.  Plan:  Continue enoxaparin 1 mg/kg subQ q12h, dose 75mg  SQ bid   Minda Ditto, PharmD 10/19/21 11:21 AM

## 2021-10-19 NOTE — TOC Progression Note (Signed)
Transition of Care [facility]) - Progression Note    Patient Details  Name: Ian Weber MRN: [id number] Date of Birth: 1934/07/14  Transition of Care [facility]) CM/SW Contact  Purcell Mouton, RN Phone Number: 10/19/2021, 9:32 AM  Clinical Narrative:    Referral for [facility] given to in house rep CSW.   Expected Discharge Plan: [place] Barriers to Discharge: No Barriers Identified  Expected Discharge Plan and Services Expected Discharge Plan: [place] arrangements for the past 2 months: Single Family Home                                       Social Determinants of Health (SDOH) Interventions    Readmission Risk Interventions No flowsheet data found.

## 2021-10-19 NOTE — Progress Notes (Addendum)
Manufacturing engineer [facility]) Hospital Liaison Note:  Received request from [facility] for family interest in [facility]. Chart reviewed and spoke with family to acknowledge referral.  Patient is eligible per Dr. Tomasa Hosteller, an [facility] Physician.  Unfortunately [employer] [facility] is not able to offer a room today. Family and TOC are aware [facility] Liaison will follow up with [facility] and family tomorrow or sooner if room becomes available.  Please do not hesitate to call with questions.  Thank you.   Gar Ponto, RN [facility] Liaison  [place] are on [place]

## 2021-10-19 NOTE — Progress Notes (Signed)
PROGRESS NOTE  Ian Weber CWC:[id number] DOB: [birth year] DOA: 10/04/2021 PCP: Jilda Panda, MD  Brief History   Ian Weber is a 88 y.o. male with medical history significant for dementia, hypertension, hypothyroidism, history of GI bleed who presents from home with concerns of increasing altered mental status and generalized weakness.  Associated with poor appetite and poor oral intake.  Was brought to the ED for further evaluation.  Recent COVID-19 positive 09/28/21.  Started on antiviral IV remdesivir.   Hospital course complicated by findings of B/L pulmonary embolism, started hep drip 10/05/21. Minimal responsiveness, restless agitation delirium reported early morning 12/30.  Mental status has failed to make meaningful improvements and he continues to have significant dysphagia.  He is unable to swallow without aspiration.  After several discussions with the patient's wife and daughter, his wife has elected to proceed with referral to residential hospice, [facility]   The patient's speech has been unintelligible, and he has had difficulty swallowing.   He is awaiting a bed a Optometrist.   Consultants  PCCM Neurology Palliative care Wound Ostomy Care  Procedures  None  Antibiotics   Anti-infectives (From admission, onward)    Start     Dose/Rate Route Frequency Ordered Stop   10/10/21 1400  vancomycin (VANCOREADY) IVPB 1000 mg/200 mL  Status:  Discontinued        1,000 mg 200 mL/hr over 60 Minutes Intravenous Every 12 hours 10/10/21 1244 10/13/21 2122   10/10/21 0800  cefTRIAXone (ROCEPHIN) 2 g in sodium chloride 0.9 % 100 mL IVPB        2 g 200 mL/hr over 30 Minutes Intravenous Every 12 hours 10/10/21 0648 10/23/21 2359   10/10/21 0800  ampicillin (OMNIPEN) 2 g in sodium chloride 0.9 % 100 mL IVPB  Status:  Discontinued        2 g 300 mL/hr over 20 Minutes Intravenous Every 4 hours 10/10/21 0648 10/13/21 2122   10/10/21 0800  acyclovir (ZOVIRAX) 725 mg in dextrose 5  % 150 mL IVPB  Status:  Discontinued        10 mg/kg  72.6 kg 164.5 mL/hr over 60 Minutes Intravenous Every 8 hours 10/10/21 0701 10/13/21 2122   10/08/21 1300  vancomycin (VANCOREADY) IVPB 1500 mg/300 mL  Status:  Discontinued        1,500 mg 150 mL/hr over 120 Minutes Intravenous Every 24 hours 10/08/21 1213 10/10/21 1228   10/06/21 0900  piperacillin-tazobactam (ZOSYN) IVPB 3.375 g  Status:  Discontinued        3.375 g 12.5 mL/hr over 240 Minutes Intravenous Every 8 hours 10/06/21 0823 10/08/21 1209   10/05/21 1000  remdesivir 100 mg in sodium chloride 0.9 % 100 mL IVPB       See Hyperspace for full Linked Orders Report.   100 mg 200 mL/hr over 30 Minutes Intravenous Daily 10/04/21 2151 10/08/21 1219   10/04/21 2300  remdesivir 100 mg in sodium chloride 0.9 % 100 mL IVPB       See Hyperspace for full Linked Orders Report.   100 mg 200 mL/hr over 30 Minutes Intravenous  Once 10/04/21 2151 10/05/21 0044   10/04/21 2230  remdesivir 100 mg in sodium chloride 0.9 % 100 mL IVPB       See Hyperspace for full Linked Orders Report.   100 mg 200 mL/hr over 30 Minutes Intravenous  Once 10/04/21 2151 10/04/21 2332   10/04/21 2200  nirmatrelvir/ritonavir EUA (PAXLOVID) 3 tablet  Status:  Discontinued  3 tablet Oral 2 times daily 10/04/21 2028 10/04/21 2131       Subjective  The patient is lying quietly. He opens eyes to voice. No acute distress.  Objective   Vitals:  Vitals:   10/19/21 0400 10/19/21 1513  BP: 129/66   Pulse:    Resp: 17   Temp: 97.8 F (36.6 C) 99.1 F (37.3 C)  SpO2: 100%     Exam:  Constitutional:  The patient is awake, alert, and oriented x 3. No acute distress. Respiratory:  No increased work of breathing. No wheezes, rales, or rhonchi No tactile fremitus Cardiovascular:  Regular rate and rhythm No murmurs, ectopy, or gallups. No lateral PMI. No thrills. Abdomen:  Abdomen is soft, non-tender, non-distended No hernias, masses, or  organomegaly Normoactive bowel sounds.  Musculoskeletal:  No cyanosis, clubbing, or edema Skin:  No rashes, lesions, ulcers palpation of skin: no induration or nodules Neurologic:  CN 2-12 intact Sensation all 4 extremities intact Psychiatric:  Mental status Mood, affect appropriate Orientation to person, place, time  judgment and insight appear intact   I have personally reviewed the following:   Today's Data  Vitals  Lab Data    Micro Data  Blood culture x 2 - No growth Tracheal aspirate: Streptococcus pneumoniae, Staphylococcus aureus  Imaging  CT chest abdomen and pelvis CTA Chest CT head  Cardiology Data  EKG Echocardiogram  Other Data    Scheduled Meds:  chlorhexidine  15 mL Mouth Rinse BID   enoxaparin (LOVENOX) injection  1 mg/kg Subcutaneous BID   feeding supplement (PROSource TF)  45 mL Per Tube TID   free water  200 mL Per Tube Q8H   levothyroxine  100 mcg Oral Q0600   mouth rinse  15 mL Mouth Rinse q12n4p   Continuous Infusions:  cefTRIAXone (ROCEPHIN)  IV 2 g (10/19/21 1050)   feeding supplement (OSMOLITE 1.5 CAL) 1,000 mL (10/19/21 1902)    Principal Problem:   AMS (altered mental status) Active Problems:   Pressure injury of skin   COVID-19 virus infection   Elevated troponin   Dementia without behavioral disturbance (HCC)   HTN (hypertension)   LOS: 15 days   A & P  Persistent acute metabolic encephalopathy, multifactorial secondary to COVID-19 viral infection and hypernatremia from poor oral intake, dehydration.  Covid 19+ 09/28/21 CT head nonacute, CT abdomen and pelvis with contrast nonacute. NG tube placement for hydration on 10/07/2021.  Continue free water flushing to avoid dehydration. No oral intake for several days, updated his wife at bedside.  She declines tube feeding on 10/09/2021.  After discussing with PCCM, family agreed to tube feeding, started 10/09/21 osmolite 1.2 cal trickle feeds.  Dietitian consulted. Ammonia,  B12, TSH, MRI brain, Trop, all nonrevealing.  Blood cx x 2 peripherally, in process. Thiamine level in process, he has been receiving high-dose IV thiamine Patient seen by neurology EEG did not show any epileptiform discharges Lumbar puncture was attempted by radiology on 1/3, but was unsuccessful after 4 attempts Suspicion is that his encephalopathy is related to COVID in the setting of dementia.  May also have a component of hospital delirium. His wife currently feels that his mental status is mildly better today She wishes to continue with current treatments and monitor for further improvement. I expressed that if he does not make meaningful improvements, would need to readdress goals of care and continued tube feeding. Prior to lumbar puncture attempts, he was started empirically on vancomycin, ceftriaxone, ampicillin and acyclovir Without  CSF data, difficult to determine need for continued therapy Discussed with infectious disease, will continue total of 2 weeks of ceftriaxone 2 g every 12 hours (to be completed on 1/16), but will discontinue acyclovir, ampicillin and vancomycin for now and observe. -Currently, mental status appears to be very gradually improving.  He is currently at a point where he can make eye contact, follows some basic commands.  He is still not able to swallow. -He was reevaluated by speech therapy today continues to have significant dysphagia, unable to swallow.  He still has significant encephalopathy. -Discussed with wife the option of PEG tube, but she reports that the patient would not want to have a long-term PEG tube -She is electing referral to residential hospice after discussing with palliative care.   COVID-19 viral infection with Staph aureus and Streptococcus pneumonia coinfection, POA Sputum culture obtained on 10/06/2021, returned positive for staph aureus and Streptococcus pneumoniae  Discussed with ID and will be treated with ceftriaxone   Newly  diagnosed left lower lobe Staph aureus and Streptococcus community-acquired pneumonia, POA Management as stated above Monitor fever curve and WBC   Newly diagnosed bilateral acute pulmonary embolism Started on heparin drip on 10/05/2021, subsequently transition to Lovenox No evidence of right heart strain on CT scan.   Hematuria -Noted in condom catheter -Continue to monitor for now and follow hemoglobin.  Hemoglobin currently stable -If significant drop in hemoglobin occurs, would need to hold anticoagulation.   Left pleural effusion in setting of pneumonia Hold off IV fluid hydration to avoid worsening of left pleural effusion Continue IV antibiotics   Resolved hypovolemic hypernatremia in the setting of poor oral intake Free water flushes 200 cc every 6 hours. Hold off IV fluid hydration to avoid worsening left pleural effusion.   Acute hypoxic respiratory failure secondary to above Continue to maintain oxygen saturation greater than 92% Currently on room air Hypoxemia seen on ABG 10/06/2021   Elevated troponin in the setting of covid 19 infection and bilateral pulmonary embolism Trop S peaked at >4300 and down trended -Echocardiogram shows ejection fraction of 55 to 60% with no wall motion abnormalities. Continue to monitor on telemetry   Prolonged QTC QTC greater than 500 on 12 EKG done on 10/04/2021 Unable to obtain 12 EKG as the patient would not cooperate. Continue to optimize magnesium and potassium levels Continue to avoid QTC prolonging agents   Covid-19 viral infection, POA He received a course of remdesivir   Resolved post repletion: Hypokalemia K+ 3.2> 3.8 Continue to replete electrolytes as indicated.   Dementia Patient's daughter does confirm that he did have mild cognitive deficits prior to his current illness at baseline.   Continue fall/aspiration/delirium precautions   Goals of care Palliative care team consulted to assist with establishing goals  of care Ongoing discussion of goals of care with family.  DNR as of 10/07/2021 -After several conversations with his wife and his daughter, due to his lack of progress, persistent encephalopathy and dysphagia, it was recommended that NG tube be discontinued -PEG tube was discussed, but patient's wife reports that he would not want to have a PEG tube long-term. -After discussing with palliative care, his wife is agreeable to residential hospice referral   Pressure Injury 10/04/21 Buttocks Bilateral Stage 2 -  Partial thickness loss of dermis presenting as a shallow open injury with a red, pink wound bed without slough. (Active) 10/04/21 2300 Location: Buttocks Location Orientation: Bilateral Staging: Stage 2 -  Partial thickness loss of dermis presenting  as a shallow open injury with a red, pink wound bed without slough. Wound Description (Comments): Present on Admission:Yes  I have seen and examined this patient myself. I have spent 34 minutes in his evaluation and care.   DVT Prophylaxis:Lovenox Code Status: DNR Family Communication: None available Disposition Plan: His wife has agreed to referral to residential hospice, [facility]   Laurita Peron, DO [facility] Direct contact: see [email or web address]  7PM-7AM contact night coverage as above 10/19/2021, 7:32 PM  LOS: 15 days

## 2021-10-19 NOTE — Progress Notes (Signed)
Daily Progress Note   Patient Name: Ian Weber       Date: 10/19/2021 DOB: 10/18/1933  Age: 88 y.o. MRN#: [id number] Attending Physician: Karie Kirks, DO Primary Care Physician: Jilda Panda, MD Admit Date: 10/04/2021  Reason for Consultation/Follow-up: Establishing goals of care  Subjective: I saw and examined Mr. Stankey today.  He was lying in bed and slept through encounter.  He did not arouse with gentle verbal or tactile stimulation.  I met at the bedside with his wife again today to discuss long-term goals in light of his continued decline.  We again reviewed his clinical course this hospitalization as well as options for care moving forward and recommendation for transition to [facility] to focus on quality of life what ever time he has left.  She states that she understands Dr.'s concerns but there is still a part of her that is hopeful that he will be healed through God's intervention.  Discussed that if he does transition to [facility] and has a turnaround to the point where he wakes up and is able to eat and drink, they would work on discharge plan he would not be able to stay at [facility] long-term.  I was also very clear with her, however, that I did not think this was going to be likely and I expect his prognosis is less than 2 weeks.  We talked about the short-term feeding tube he has in his nose and other interventions, such as antibiotics, that would not line up with overall goal of focusing on comfort and working to transition to [employer] [facility].  I told her that I recommended pulling NG and stopping antibiotics as I do not think either of these are contributing to his quality of life at this point. She would like to discuss with family this evening but understands that I  am planning to discontinue the above tomorrow.  Length of Stay: 15  Current Medications: Scheduled Meds:   chlorhexidine  15 mL Mouth Rinse BID   enoxaparin (LOVENOX) injection  1 mg/kg Subcutaneous BID   feeding supplement (PROSource TF)  45 mL Per Tube TID   free water  200 mL Per Tube Q8H   levothyroxine  100 mcg Oral Q0600   mouth rinse  15 mL Mouth Rinse q12n4p  Continuous Infusions:  cefTRIAXone (ROCEPHIN)  IV 2 g (10/19/21 1050)   feeding supplement (OSMOLITE 1.5 CAL) 1,000 mL (10/16/21 1003)    PRN Meds: levalbuterol, lip balm, liver oil-zinc oxide, loperamide  Physical Exam   General: Somnolent.  Copious secretions. Heart: Regular rate and rhythm. No murmur appreciated. Lungs: Coarse throughout Abdomen: Soft, nontender, nondistended, positive bowel sounds.   Ext: No significant edema        Vital Signs: BP 129/66 (BP Location: Left Arm)    Pulse 67    Temp 99.1 F (37.3 C) (Oral)    Resp 17    Ht 6' (1.829 m)    Wt 106.8 kg    SpO2 100%    BMI 31.93 kg/m  SpO2: SpO2: 100 % O2 Device: O2 Device: Room Air O2 Flow Rate: O2 Flow Rate (L/min): 2 L/min  Intake/output summary:  Intake/Output Summary (Last 24 hours) at 10/19/2021 1620 Last data filed at 10/19/2021 1057 Gross per 24 hour  Intake --  Output 1550 ml  Net -1550 ml    LBM: Last BM Date: 10/14/21 Baseline Weight: Weight: 72.6 kg Most recent weight: Weight: 106.8 kg       Palliative Assessment/Data:      Patient Active Problem List   Diagnosis Date Noted   Pressure injury of skin 10/05/2021   COVID-19 virus infection 10/05/2021   Elevated troponin 10/05/2021   Dementia without behavioral disturbance ([place]) 10/05/2021   HTN (hypertension) 10/05/2021   AMS (altered mental status) 10/04/2021   Anxiety 09/24/2017   GIB (gastrointestinal bleeding) 09/24/2017   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   Constipation 01/02/2012   BRBPR (bright red blood per rectum) 01/02/2012   VITAMIN D DEFICIENCY  10/22/2009   Diverticulosis of colon with hemorrhage 10/22/2009   ABDOMINAL PAIN, EPIGASTRIC 10/22/2009   THYROID CANCER, HX OF 10/22/2009   HYPOTHYROIDISM, POSTSURGICAL 10/20/2009   Essential hypertension 10/20/2009   ESOPHAGEAL STRICTURE 10/20/2009   GERD 10/20/2009   HIATAL HERNIA 10/20/2009    Palliative Care Assessment & Plan   Patient Profile: 88 year old male with past medical history of dementia, hypertension, hypothyroidism, GI bleed admitted with weakness, altered mental status, and excess secretions with increased work of breathing in light of known COVID-19 infection.  Recommendations/Plan: DNR/DNI No long-term feeding tube moving forward.  Discussed that with his lack of nutrition hydration and plan to avoid PEG, we are approaching end-of-life.  We also discussed plan to discontinue NG tube.  She would like to ensure family is aware prior to stopping this.  I told her that, at this point, we would plan to stop NG tube tomorrow. We also discussed that he has been on antibiotics for meningitis and this is also something I recommend stopping if her goal is to transition to [facility] to focus on comfort as he approaches end-of-life.  She would also like a chance to inform family of this.  Discussed plan to stop these also tomorrow. We again talked about residential hospice and recommendation of working towards this discharge plan.  He is being evaluated currently but no bed is available.  Care plan was discussed with wife  Thank you for allowing the Palliative Medicine Team to assist in the care of this patient.   Time In: 1015 Time Out: 1110 Total Time 55 Prolonged Time Billed No   Trusten Hume Domingo Cocking, MD  Please contact Palliative Medicine Team phone at [phone] for questions and concerns.

## 2021-10-19 NOTE — Progress Notes (Signed)
PT Cancellation Note  Patient Details Name: Ian Weber MRN: [id number] DOB: Aug 03, 1934   Cancelled Treatment:    Reason Eval/Treat Not Completed:  Pt does not appear appropriate for PT services. Per chart review, plan is for residential hospice. Will sign off.    [facility]  Office: [phone] Pager: [phone]

## 2021-10-20 LAB — GLUCOSE, CAPILLARY
Glucose-Capillary: 113 mg/dL — ABNORMAL HIGH (ref 70–99)
Glucose-Capillary: 119 mg/dL — ABNORMAL HIGH (ref 70–99)
Glucose-Capillary: 82 mg/dL (ref 70–99)
Glucose-Capillary: 86 mg/dL (ref 70–99)
Glucose-Capillary: 91 mg/dL (ref 70–99)
Glucose-Capillary: 95 mg/dL (ref 70–99)

## 2021-10-20 MED ORDER — GLYCOPYRROLATE 0.2 MG/ML IJ SOLN: 0.2000 mg | INTRAMUSCULAR | Status: DC | PRN

## 2021-10-20 MED ORDER — OXYCODONE HCL 5 MG/5ML PO SOLN: 5.0000 mg | ORAL | Status: DC | PRN

## 2021-10-20 NOTE — Progress Notes (Addendum)
Nutrition Brief Note  Patient last seen for assessment by a RD on 1/9. Chart reviewed. Palliative Care following. Patient pulled NGT out earlier today; not replaced.  Patient is now transitioning to comfort care with plan for discharge to residential hospice once a bed is available.   No further nutrition interventions planned at this time. Please re-consult as needed.      Jarome Matin, MS, RD, LDN Inpatient Clinical Dietitian RD pager # available in [place]  After hours/weekend pager # available in [facility]

## 2021-10-20 NOTE — Progress Notes (Signed)
Speech Language Pathology Treatment: Dysphagia  Patient Details Name: Ian Weber MRN: [id number] DOB: July 20, 1934 Today's Date: 10/20/2021 Time: 3810-1751 SLP Time Calculation (min) (ACUTE ONLY): 20 min  Assessment / Plan / Recommendation Clinical Impression  Pt seen for skilled SLP to aid in determining most appropriate diet for comfort feeding. Pt's wife not present earlier today or currently - thus visit conducted in her absence.  PT asleep but woke adequately to accept po intake.  Oral care provided removing viscous yellow tinged secretions from hard palate - Congested cough noted at baseline but with significantly increase in incidence after swallowing graham cracker.  Prolonged oral transiting with pt benefiting from verbal cue to swallow at times - most notably with ice chip and solids requiring mastication.  Larger amounts of liquid via cup or straw resulted in strong congested coughing post-multiple swallows. Pt comfort maximized with small single boluses or tsp amounts.  Recommend full liquids in small amounts for enjoyment only - cease po if pt coughing.   Placed pt's bed in reverse trendelenburg for better position given Size-wise bed.  Pt may require cues to swallow - and can conduct as cued with mod cues and extra time.    Had educated wife extensively to dysphagia, risks, comfort diet, etc during hospital coarse.  SLP will sign off at this time, given comfort po planned and most appropriate diet for comfort ordered.  Thanks.    HPI HPI: Ian Weber is a 88 yo male with a PMH of dementia, hypertension, hypothyroidism, history of GI bleed who presents from home with concerns for increasing altered mental status, poor appetite and poor PO intake. He tested positive for COVID about six days PTA. Dx acute metabolic encephalopathy secondary to COVID-19, hypernatremia, dehydration.  Pt has undergone BSE with recommendations for NPO x ice chips.  He is s/p small bore feeding tube - and at this  time, discussion has been ongoing re: potential PEG.  SLP follow up regarding dysphagia management - pt pulled out his NG tube and plan is to stop ABX and have pt transfer to [facility].      SLP Plan  All goals met (comfort diet prescribed)      Recommendations for follow up therapy are one component of a multi-disciplinary discharge planning process, led by the attending physician.  Recommendations may be updated based on patient status, additional functional criteria and insurance authorization.    Recommendations  Diet recommendations: Thin liquid;Nectar-thick liquid (full liquids) Medication Administration: Via alternative means (or crush with icecream, ensure, applesauce, etc) Supervision: Full supervision/cueing for compensatory strategies Compensations: Slow rate;Small sips/bites;Other (Comment) (cue pt to swallow as needed, oral suction after po, stop intake if pt coughing) Postural Changes and/or Swallow Maneuvers: Seated upright 90 degrees;Upright 30-60 min after meal (reverse trendelenburg)                Oral Care Recommendations: Oral care QID Follow Up Recommendations: No SLP follow up Assistance recommended at discharge: Frequent or constant Supervision/Assistance SLP Visit Diagnosis: Dysphagia, unspecified (R13.10);Dysphagia, pharyngoesophageal phase (R13.14);Dysphagia, oral phase (R13.11) Plan: All goals met (comfort diet prescribed)        Kathleen Lime, MS [facility] SLP [place] Office [phone] Cell [phone]    Macario Golds  10/20/2021, 4:41 PM

## 2021-10-20 NOTE — Progress Notes (Signed)
[facility] [address] [employer] [facility]) Hospital Liaison Note   Unfortunately, Hospice Home is not able to offer a room today. Family and [facility] Manager aware hospital liaison will follow up tomorrow or sooner if a room becomes available.    Please do not hesitate to call with any hospice related questions.    Thank you for the opportunity to participate in this patient's care.   Dacian Orrico, MSW [facility] Liaison [phone]

## 2021-10-20 NOTE — Progress Notes (Signed)
Daily Progress Note   Patient Name: Ian Weber       Date: 10/20/2021 DOB: [birth year]  Age: 88 y.o. MRN#: [id number] Attending Physician: Karie Kirks, DO Primary Care Physician: Jilda Panda, MD Admit Date: 10/04/2021  Reason for Consultation/Follow-up: Establishing goals of care  Subjective: I saw and examined Mr. Ian Weber today.  He was lying in bed in no distress at time of my encounter.  Opened eyes briefly but otherwise he did not speak or engage with me.  Noted to have pulled out his NG tube this morning and I called and spoke with his wife about care plan moving forward.  Yesterday, we had discussed working to discontinue NG tube.  As it is now out due to him pulling it this morning, we discussed plan to continue without NG tube.  She expressed understanding but she does not want to feel as though he is "being starved."  We discussed plan for SLP to evaluate and make recommendation for diet with understanding this is to promote his comfort and he will not take in enough to support his own nutrition and hydration needs.  We also discussed risks of continued aspiration but the goal moving forward is for him to feel as well as he can and being able to have bites and sips will add to his pleasure.  We also discussed stopping antibiotics and she expressed understanding.  Finally, we discussed regarding plan to transition to [facility] for end-of-life care.  She reports that she and family still feel this would be best option for him moving forward.  She does express that she does not want him to get morphine.  Attempted to explore her concerns but she stated again, "just please don't make him take morphine."  I talked about needing to make sure we manage symptoms including pain or shortness of  breath and she was in agreement with utilization of other pain medications but states that other family members had issues with morphine in the past and she does not want him to have it.  I let her know that we would avoid here in the hospital and I would pass this request along to hospice as well.  Length of Stay: 16  Current Medications: Scheduled Meds:   chlorhexidine  15 mL Mouth Rinse BID   enoxaparin (LOVENOX)  injection  1 mg/kg Subcutaneous BID   levothyroxine  100 mcg Oral Q0600   mouth rinse  15 mL Mouth Rinse q12n4p    Continuous Infusions:    PRN Meds: levalbuterol, lip balm, liver oil-zinc oxide, loperamide  Physical Exam   General: Somnolent.  Copious secretions. Heart: Regular rate and rhythm. No murmur appreciated. Lungs: Coarse throughout Abdomen: Soft, nontender, nondistended, positive bowel sounds.   Ext: No significant edema        Vital Signs: BP 100/62    Pulse 66    Temp 98.5 F (36.9 C) (Oral)    Resp 15    Ht 6' (1.829 m)    Wt 106.8 kg    SpO2 100%    BMI 31.93 kg/m  SpO2: SpO2: 100 % O2 Device: O2 Device: Room Air O2 Flow Rate: O2 Flow Rate (L/min): 2 L/min  Intake/output summary:  Intake/Output Summary (Last 24 hours) at 10/20/2021 1508 Last data filed at 10/20/2021 0456 Gross per 24 hour  Intake --  Output 700 ml  Net -700 ml    LBM: Last BM Date: 10/14/21 (per chart) Baseline Weight: Weight: 72.6 kg Most recent weight: Weight: 106.8 kg       Palliative Assessment/Data:      Patient Active Problem List   Diagnosis Date Noted   Pressure injury of skin 10/05/2021   COVID-19 virus infection 10/05/2021   Elevated troponin 10/05/2021   Dementia without behavioral disturbance ([place]) 10/05/2021   HTN (hypertension) 10/05/2021   AMS (altered mental status) 10/04/2021   Anxiety 09/24/2017   GIB (gastrointestinal bleeding) 09/24/2017   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   Constipation 01/02/2012   BRBPR (bright red blood per rectum)  01/02/2012   VITAMIN D DEFICIENCY 10/22/2009   Diverticulosis of colon with hemorrhage 10/22/2009   ABDOMINAL PAIN, EPIGASTRIC 10/22/2009   THYROID CANCER, HX OF 10/22/2009   HYPOTHYROIDISM, POSTSURGICAL 10/20/2009   Essential hypertension 10/20/2009   ESOPHAGEAL STRICTURE 10/20/2009   GERD 10/20/2009   HIATAL HERNIA 10/20/2009    Palliative Care Assessment & Plan   Patient Profile: 88 year old male with past medical history of dementia, hypertension, hypothyroidism, GI bleed admitted with weakness, altered mental status, and excess secretions with increased work of breathing in light of known COVID-19 infection.  Recommendations/Plan: DNR/DNI He pulled out his NG tube this morning.  We will not replace it.  Discussed with his wife.  Plan for SLP to evaluate and make recommendation on best diet for comfort moving forward. Discussed stopping antibiotics.  His wife was in agreement. Overall, goal remains to get to residential hospice at [facility].  Family is requesting that he not be given morphine but is open to any other pain medications that may be necessary.  Care plan was discussed with wife, Dr. Benny Lennert, bedside RN, and SLP  Thank you for allowing the Palliative Medicine Team to assist in the care of this patient.   Time In: 1000 Time Out: 1055 Total Time 55 Prolonged Time Billed No   Witten Certain Domingo Cocking, MD  Please contact Palliative Medicine Team phone at [phone] for questions and concerns.

## 2021-10-20 NOTE — Care Management Important Message (Signed)
Important Message  Patient Details IM Letter placed in Patients room. Name: Ian Weber MRN: [id number] Date of Birth: 1934/08/09   Medicare Important Message Given:  Yes     Kerin Salen 10/20/2021, 12:19 PM

## 2021-10-20 NOTE — Progress Notes (Signed)
Pt pulled out NGT while spouse in bathroom, Feeding stopped, MD updated and replied to leave out for now, discussion planned for discontinuation of NGT possibly today. Pt spouse aware and acknowledged conversation. SRP, RN

## 2021-10-20 NOTE — Plan of Care (Signed)
°  Problem: Coping: Goal: Psychosocial and spiritual needs will be supported 10/20/2021 1604 by Zadie Rhine, RN Outcome: Progressing 10/20/2021 1522 by Zadie Rhine, RN Outcome: Progressing   Problem: Respiratory: Goal: Will maintain a patent airway 10/20/2021 1604 by Zadie Rhine, RN Outcome: Progressing 10/20/2021 1522 by Zadie Rhine, RN Outcome: Progressing Goal: Complications related to the disease process, condition or treatment will be avoided or minimized 10/20/2021 1604 by Zadie Rhine, RN Outcome: Progressing 10/20/2021 1522 by Zadie Rhine, RN Outcome: Progressing

## 2021-10-20 NOTE — Progress Notes (Signed)
PROGRESS NOTE  Ian Weber UVO:[id number] DOB: [birth year] DOA: 10/04/2021 PCP: Jilda Panda, MD  Brief History   Ian Weber is a 88 y.o. male with medical history significant for dementia, hypertension, hypothyroidism, history of GI bleed who presents from home with concerns of increasing altered mental status and generalized weakness.  Associated with poor appetite and poor oral intake.  Was brought to the ED for further evaluation.  Recent COVID-19 positive 09/28/21.  Started on antiviral IV remdesivir.   Hospital course complicated by findings of B/L pulmonary embolism, started hep drip 10/05/21. Minimal responsiveness, restless agitation delirium reported early morning 12/30.  Mental status has failed to make meaningful improvements and he continues to have significant dysphagia.  He is unable to swallow without aspiration.  After several discussions with the patient's wife and daughter, his wife has elected to proceed with referral to residential hospice, [facility]   The patient's speech has been unintelligible, and he has had difficulty swallowing.   He is awaiting a bed a Optometrist.   The patient's family is very opposed to the use of Morphine. They equate that with euthanasia.  Consultants  PCCM Neurology Palliative care Wound Ostomy Care  Procedures  None  Antibiotics   Anti-infectives (From admission, onward)    Start     Dose/Rate Route Frequency Ordered Stop   10/10/21 1400  vancomycin (VANCOREADY) IVPB 1000 mg/200 mL  Status:  Discontinued        1,000 mg 200 mL/hr over 60 Minutes Intravenous Every 12 hours 10/10/21 1244 10/13/21 2122   10/10/21 0800  cefTRIAXone (ROCEPHIN) 2 g in sodium chloride 0.9 % 100 mL IVPB  Status:  Discontinued        2 g 200 mL/hr over 30 Minutes Intravenous Every 12 hours 10/10/21 0648 10/20/21 1100   10/10/21 0800  ampicillin (OMNIPEN) 2 g in sodium chloride 0.9 % 100 mL IVPB  Status:  Discontinued        2 g 300 mL/hr over 20  Minutes Intravenous Every 4 hours 10/10/21 0648 10/13/21 2122   10/10/21 0800  acyclovir (ZOVIRAX) 725 mg in dextrose 5 % 150 mL IVPB  Status:  Discontinued        10 mg/kg  72.6 kg 164.5 mL/hr over 60 Minutes Intravenous Every 8 hours 10/10/21 0701 10/13/21 2122   10/08/21 1300  vancomycin (VANCOREADY) IVPB 1500 mg/300 mL  Status:  Discontinued        1,500 mg 150 mL/hr over 120 Minutes Intravenous Every 24 hours 10/08/21 1213 10/10/21 1228   10/06/21 0900  piperacillin-tazobactam (ZOSYN) IVPB 3.375 g  Status:  Discontinued        3.375 g 12.5 mL/hr over 240 Minutes Intravenous Every 8 hours 10/06/21 0823 10/08/21 1209   10/05/21 1000  remdesivir 100 mg in sodium chloride 0.9 % 100 mL IVPB       See Hyperspace for full Linked Orders Report.   100 mg 200 mL/hr over 30 Minutes Intravenous Daily 10/04/21 2151 10/08/21 1219   10/04/21 2300  remdesivir 100 mg in sodium chloride 0.9 % 100 mL IVPB       See Hyperspace for full Linked Orders Report.   100 mg 200 mL/hr over 30 Minutes Intravenous  Once 10/04/21 2151 10/05/21 0044   10/04/21 2230  remdesivir 100 mg in sodium chloride 0.9 % 100 mL IVPB       See Hyperspace for full Linked Orders Report.   100 mg 200 mL/hr over 30 Minutes  Intravenous  Once 10/04/21 2151 10/04/21 2332   10/04/21 2200  nirmatrelvir/ritonavir EUA (PAXLOVID) 3 tablet  Status:  Discontinued        3 tablet Oral 2 times daily 10/04/21 2028 10/04/21 2131       Subjective  The patient is lying quietly. No acute distress. Family is at bedside.  Objective   Vitals:  Vitals:   10/20/21 0800 10/20/21 1306  BP: 100/62   Pulse:    Resp: 15   Temp:  98.5 F (36.9 C)  SpO2:      Exam:  Constitutional:  The patient is awake, alert, and oriented x 3. No acute distress. Respiratory:  No increased work of breathing. No wheezes, rales, or rhonchi No tactile fremitus Cardiovascular:  Regular rate and rhythm No murmurs, ectopy, or gallups. No lateral PMI. No  thrills. Abdomen:  Abdomen is soft, non-tender, non-distended No hernias, masses, or organomegaly Normoactive bowel sounds.  Musculoskeletal:  No cyanosis, clubbing, or edema Skin:  No rashes, lesions, ulcers palpation of skin: no induration or nodules Neurologic:  CN 2-12 intact Sensation all 4 extremities intact Psychiatric:  Mental status Mood, affect appropriate Orientation to person, place, time  judgment and insight appear intact   I have personally reviewed the following:   Today's Data  Vitals  Lab Data    Micro Data  Blood culture x 2 - No growth Tracheal aspirate: Streptococcus pneumoniae, Staphylococcus aureus  Imaging  CT chest abdomen and pelvis CTA Chest CT head  Cardiology Data  EKG Echocardiogram  Other Data    Scheduled Meds:  chlorhexidine  15 mL Mouth Rinse BID   enoxaparin (LOVENOX) injection  1 mg/kg Subcutaneous BID   levothyroxine  100 mcg Oral Q0600   mouth rinse  15 mL Mouth Rinse q12n4p    Principal Problem:   AMS (altered mental status) Active Problems:   Pressure injury of skin   COVID-19 virus infection   Elevated troponin   Dementia without behavioral disturbance (HCC)   HTN (hypertension)   LOS: 16 days   A & P  Persistent acute metabolic encephalopathy, multifactorial secondary to COVID-19 viral infection and hypernatremia from poor oral intake, dehydration.  Covid 19+ 09/28/21 CT head nonacute, CT abdomen and pelvis with contrast nonacute. NG tube placement for hydration on 10/07/2021.  Continue free water flushing to avoid dehydration. No oral intake for several days, updated his wife at bedside.  She declines tube feeding on 10/09/2021.  After discussing with PCCM, family agreed to tube feeding, started 10/09/21 osmolite 1.2 cal trickle feeds.  Dietitian consulted. Ammonia, B12, TSH, MRI brain, Trop, all nonrevealing.  Blood cx x 2 peripherally, in process. Thiamine level in process, he has been receiving high-dose IV  thiamine Patient seen by neurology EEG did not show any epileptiform discharges Lumbar puncture was attempted by radiology on 1/3, but was unsuccessful after 4 attempts Suspicion is that his encephalopathy is related to COVID in the setting of dementia.  May also have a component of hospital delirium. His wife currently feels that his mental status is mildly better today She wishes to continue with current treatments and monitor for further improvement. I expressed that if he does not make meaningful improvements, would need to readdress goals of care and continued tube feeding. Prior to lumbar puncture attempts, he was started empirically on vancomycin, ceftriaxone, ampicillin and acyclovir Without CSF data, difficult to determine need for continued therapy Discussed with infectious disease, will continue total of 2 weeks of ceftriaxone 2  g every 12 hours (to be completed on 1/16), but will discontinue acyclovir, ampicillin and vancomycin for now and observe. -Currently, mental status appears to be very gradually improving.  He is currently at a point where he can make eye contact, follows some basic commands.  He is still not able to swallow. -He was reevaluated by speech therapy today continues to have significant dysphagia, unable to swallow.  He still has significant encephalopathy. -Discussed with wife the option of PEG tube, but she reports that the patient would not want to have a long-term PEG tube -She is electing referral to residential hospice after discussing with palliative care.   COVID-19 viral infection with Staph aureus and Streptococcus pneumonia coinfection, POA Sputum culture obtained on 10/06/2021, returned positive for staph aureus and Streptococcus pneumoniae  Discussed with ID and will be treated with ceftriaxone   Newly diagnosed left lower lobe Staph aureus and Streptococcus community-acquired pneumonia, POA Management as stated above Monitor fever curve and WBC    Newly diagnosed bilateral acute pulmonary embolism Started on heparin drip on 10/05/2021, subsequently transition to Lovenox No evidence of right heart strain on CT scan.   Hematuria -Noted in condom catheter -Continue to monitor for now and follow hemoglobin.  Hemoglobin currently stable -If significant drop in hemoglobin occurs, would need to hold anticoagulation.   Left pleural effusion in setting of pneumonia Hold off IV fluid hydration to avoid worsening of left pleural effusion Continue IV antibiotics   Resolved hypovolemic hypernatremia in the setting of poor oral intake Free water flushes 200 cc every 6 hours. Hold off IV fluid hydration to avoid worsening left pleural effusion.   Acute hypoxic respiratory failure secondary to above Continue to maintain oxygen saturation greater than 92% Currently on room air Hypoxemia seen on ABG 10/06/2021   Elevated troponin in the setting of covid 19 infection and bilateral pulmonary embolism Trop S peaked at >4300 and down trended -Echocardiogram shows ejection fraction of 55 to 60% with no wall motion abnormalities. Continue to monitor on telemetry   Prolonged QTC QTC greater than 500 on 12 EKG done on 10/04/2021 Unable to obtain 12 EKG as the patient would not cooperate. Continue to optimize magnesium and potassium levels Continue to avoid QTC prolonging agents   Covid-19 viral infection, POA He received a course of remdesivir   Resolved post repletion: Hypokalemia K+ 3.2> 3.8 Continue to replete electrolytes as indicated.   Dementia Patient's daughter does confirm that he did have mild cognitive deficits prior to his current illness at baseline.   Continue fall/aspiration/delirium precautions   Goals of care Palliative care team consulted to assist with establishing goals of care Ongoing discussion of goals of care with family.  DNR as of 10/07/2021 -After several conversations with his wife and his daughter, due to  his lack of progress, persistent encephalopathy and dysphagia, it was recommended that NG tube be discontinued -PEG tube was discussed, but patient's wife reports that he would not want to have a PEG tube long-term. -After discussing with palliative care, his wife is agreeable to residential hospice referral   Pressure Injury 10/04/21 Buttocks Bilateral Stage 2 -  Partial thickness loss of dermis presenting as a shallow open injury with a red, pink wound bed without slough. (Active) 10/04/21 2300 Location: Buttocks Location Orientation: Bilateral Staging: Stage 2 -  Partial thickness loss of dermis presenting as a shallow open injury with a red, pink wound bed without slough. Wound Description (Comments): Present on Admission:Yes  I have  seen and examined this patient myself. I have spent 32 minutes in his evaluation and care.   DVT Prophylaxis:Lovenox Code Status: DNR Family Communication: None available Disposition Plan: His wife has agreed to referral to residential hospice, [facility]  Estefany Goebel, DO [facility] Direct contact: see [email or web address]  7PM-7AM contact night coverage as above 10/20/2021, 5:34 PM  LOS: 15 days

## 2021-10-20 NOTE — Plan of Care (Signed)
  Problem: Coping: Goal: Psychosocial and spiritual needs will be supported Outcome: Progressing   Problem: Respiratory: Goal: Will maintain a patent airway Outcome: Progressing Goal: Complications related to the disease process, condition or treatment will be avoided or minimized Outcome: Progressing   

## 2021-10-21 DIAGNOSIS — R404 Transient alteration of awareness: Secondary | ICD-10-CM

## 2021-10-21 LAB — GLUCOSE, CAPILLARY
Glucose-Capillary: 111 mg/dL — ABNORMAL HIGH (ref 70–99)
Glucose-Capillary: 120 mg/dL — ABNORMAL HIGH (ref 70–99)
Glucose-Capillary: 189 mg/dL — ABNORMAL HIGH (ref 70–99)
Glucose-Capillary: 63 mg/dL — ABNORMAL LOW (ref 70–99)
Glucose-Capillary: 70 mg/dL (ref 70–99)
Glucose-Capillary: 79 mg/dL (ref 70–99)
Glucose-Capillary: 81 mg/dL (ref 70–99)

## 2021-10-21 MED ORDER — DEXTROSE 50 % IV SOLN
INTRAVENOUS | Status: AC
  Administered 2021-10-21: 50 mL
  Filled 2021-10-21: qty 50

## 2021-10-21 NOTE — Progress Notes (Signed)
PROGRESS NOTE  Ian Weber YPP:[id number] DOB: 1934/02/18 DOA: 10/04/2021 PCP: Jilda Panda, MD  Brief History   Ian Weber is a 88 y.o. male with medical history significant for dementia, hypertension, hypothyroidism, history of GI bleed who presents from home with concerns of increasing altered mental status and generalized weakness.  Associated with poor appetite and poor oral intake.  Was brought to the ED for further evaluation.  Recent COVID-19 positive 09/28/21.  Started on antiviral IV remdesivir.   Hospital course complicated by findings of B/L pulmonary embolism, started hep drip 10/05/21. Minimal responsiveness, restless agitation delirium reported early morning 12/30.  Mental status has failed to make meaningful improvements and he continues to have significant dysphagia.  He is unable to swallow without aspiration.  After several discussions with the patient's wife and daughter, his wife has elected to proceed with referral to residential hospice, [facility]   The patient's speech has been unintelligible, and he has had difficulty swallowing.   The patient's family is very opposed to the use of Morphine. They equate that with euthanasia.  The patient's wife has decided to take the patient home with hospice. Plan is for discharge tomorrow to allow time for preparations.   Consultants  PCCM Neurology Palliative care Wound Ostomy Care  Procedures  None  Antibiotics   Anti-infectives (From admission, onward)    Start     Dose/Rate Route Frequency Ordered Stop   10/10/21 1400  vancomycin (VANCOREADY) IVPB 1000 mg/200 mL  Status:  Discontinued        1,000 mg 200 mL/hr over 60 Minutes Intravenous Every 12 hours 10/10/21 1244 10/13/21 2122   10/10/21 0800  cefTRIAXone (ROCEPHIN) 2 g in sodium chloride 0.9 % 100 mL IVPB  Status:  Discontinued        2 g 200 mL/hr over 30 Minutes Intravenous Every 12 hours 10/10/21 0648 10/20/21 1100   10/10/21 0800  ampicillin  (OMNIPEN) 2 g in sodium chloride 0.9 % 100 mL IVPB  Status:  Discontinued        2 g 300 mL/hr over 20 Minutes Intravenous Every 4 hours 10/10/21 0648 10/13/21 2122   10/10/21 0800  acyclovir (ZOVIRAX) 725 mg in dextrose 5 % 150 mL IVPB  Status:  Discontinued        10 mg/kg  72.6 kg 164.5 mL/hr over 60 Minutes Intravenous Every 8 hours 10/10/21 0701 10/13/21 2122   10/08/21 1300  vancomycin (VANCOREADY) IVPB 1500 mg/300 mL  Status:  Discontinued        1,500 mg 150 mL/hr over 120 Minutes Intravenous Every 24 hours 10/08/21 1213 10/10/21 1228   10/06/21 0900  piperacillin-tazobactam (ZOSYN) IVPB 3.375 g  Status:  Discontinued        3.375 g 12.5 mL/hr over 240 Minutes Intravenous Every 8 hours 10/06/21 0823 10/08/21 1209   10/05/21 1000  remdesivir 100 mg in sodium chloride 0.9 % 100 mL IVPB       See Hyperspace for full Linked Orders Report.   100 mg 200 mL/hr over 30 Minutes Intravenous Daily 10/04/21 2151 10/08/21 1219   10/04/21 2300  remdesivir 100 mg in sodium chloride 0.9 % 100 mL IVPB       See Hyperspace for full Linked Orders Report.   100 mg 200 mL/hr over 30 Minutes Intravenous  Once 10/04/21 2151 10/05/21 0044   10/04/21 2230  remdesivir 100 mg in sodium chloride 0.9 % 100 mL IVPB       See Hyperspace  for full Linked Orders Report.   100 mg 200 mL/hr over 30 Minutes Intravenous  Once 10/04/21 2151 10/04/21 2332   10/04/21 2200  nirmatrelvir/ritonavir EUA (PAXLOVID) 3 tablet  Status:  Discontinued        3 tablet Oral 2 times daily 10/04/21 2028 10/04/21 2131       Subjective  The patient is lying quietly. No acute distress. Family is at bedside.  Objective   Vitals:  Vitals:   10/21/21 1000 10/21/21 1200  BP:  107/74  Pulse:    Resp: 13 13  Temp:  (!) 97.4 F (36.3 C)  SpO2:     Exam:  Constitutional:  The patient is awake, alert, and oriented x 3. No acute distress. Respiratory:  No increased work of breathing. No wheezes, rales, or rhonchi No  tactile fremitus Cardiovascular:  Regular rate and rhythm No murmurs, ectopy, or gallups. No lateral PMI. No thrills. Abdomen:  Abdomen is soft, non-tender, non-distended No hernias, masses, or organomegaly Normoactive bowel sounds.  Musculoskeletal:  No cyanosis, clubbing, or edema Skin:  No rashes, lesions, ulcers palpation of skin: no induration or nodules Neurologic:  CN 2-12 intact Sensation all 4 extremities intact Psychiatric:  Mental status Mood, affect appropriate Orientation to person, place, time  judgment and insight appear intact   I have personally reviewed the following:   Today's Data  Vitals  Lab Data    Micro Data  Blood culture x 2 - No growth Tracheal aspirate: Streptococcus pneumoniae, Staphylococcus aureus  Imaging  CT chest abdomen and pelvis CTA Chest CT head  Cardiology Data  EKG Echocardiogram  Other Data    Scheduled Meds:  chlorhexidine  15 mL Mouth Rinse BID   enoxaparin (LOVENOX) injection  1 mg/kg Subcutaneous BID   levothyroxine  100 mcg Oral Q0600   mouth rinse  15 mL Mouth Rinse q12n4p    Principal Problem:   AMS (altered mental status) Active Problems:   Pressure injury of skin   COVID-19 virus infection   Elevated troponin   Dementia without behavioral disturbance (HCC)   HTN (hypertension)   LOS: 17 days   A & P  Persistent acute metabolic encephalopathy, multifactorial secondary to COVID-19 viral infection and hypernatremia from poor oral intake, dehydration.  Covid 19+ 09/28/21 CT head nonacute, CT abdomen and pelvis with contrast nonacute. NG tube placement for hydration on 10/07/2021.  Continue free water flushing to avoid dehydration. No oral intake for several days, updated his wife at bedside.  She declines tube feeding on 10/09/2021.  After discussing with PCCM, family agreed to tube feeding, started 10/09/21 osmolite 1.2 cal trickle feeds.  Dietitian consulted. Ammonia, B12, TSH, MRI brain, Trop, all  nonrevealing.  Blood cx x 2 peripherally, in process. Thiamine level in process, he has been receiving high-dose IV thiamine Patient seen by neurology EEG did not show any epileptiform discharges Lumbar puncture was attempted by radiology on 1/3, but was unsuccessful after 4 attempts Suspicion is that his encephalopathy is related to COVID in the setting of dementia.  May also have a component of hospital delirium. His wife currently feels that his mental status is mildly better today She wishes to continue with current treatments and monitor for further improvement. I expressed that if he does not make meaningful improvements, would need to readdress goals of care and continued tube feeding. Prior to lumbar puncture attempts, he was started empirically on vancomycin, ceftriaxone, ampicillin and acyclovir Without CSF data, difficult to determine need for continued  therapy Discussed with infectious disease, will continue total of 2 weeks of ceftriaxone 2 g every 12 hours (to be completed on 1/16), but will discontinue acyclovir, ampicillin and vancomycin for now and observe. -Currently, mental status appears to be very gradually improving.  He is currently at a point where he can make eye contact, follows some basic commands.  He is still not able to swallow. -He was reevaluated by speech therapy today continues to have significant dysphagia, unable to swallow.  He still has significant encephalopathy. -Discussed with wife the option of PEG tube, but she reports that the patient would not want to have a long-term PEG tube -The patient's wife has decided to take the patient home with hospice tomorrow. TOC is aware and arrangements are being made to make this possible.   COVID-19 viral infection with Staph aureus and Streptococcus pneumonia coinfection, POA Sputum culture obtained on 10/06/2021, returned positive for staph aureus and Streptococcus pneumoniae  Discussed with ID and will be treated  with ceftriaxone   Newly diagnosed left lower lobe Staph aureus and Streptococcus community-acquired pneumonia, POA Management as stated above Monitor fever curve and WBC   Newly diagnosed bilateral acute pulmonary embolism Started on heparin drip on 10/05/2021, subsequently transition to Lovenox No evidence of right heart strain on CT scan.   Hematuria -Noted in condom catheter -Continue to monitor for now and follow hemoglobin.  Hemoglobin currently stable -If significant drop in hemoglobin occurs, would need to hold anticoagulation.   Left pleural effusion in setting of pneumonia Hold off IV fluid hydration to avoid worsening of left pleural effusion Continue IV antibiotics   Resolved hypovolemic hypernatremia in the setting of poor oral intake Free water flushes 200 cc every 6 hours. Hold off IV fluid hydration to avoid worsening left pleural effusion.   Acute hypoxic respiratory failure secondary to above Continue to maintain oxygen saturation greater than 92% Currently on room air Hypoxemia seen on ABG 10/06/2021   Elevated troponin in the setting of covid 19 infection and bilateral pulmonary embolism Trop S peaked at >4300 and down trended -Echocardiogram shows ejection fraction of 55 to 60% with no wall motion abnormalities. Continue to monitor on telemetry   Prolonged QTC QTC greater than 500 on 12 EKG done on 10/04/2021 Unable to obtain 12 EKG as the patient would not cooperate. Continue to optimize magnesium and potassium levels Continue to avoid QTC prolonging agents   Covid-19 viral infection, POA He received a course of remdesivir   Resolved post repletion: Hypokalemia K+ 3.2> 3.8 Continue to replete electrolytes as indicated.   Dementia Patient's daughter does confirm that he did have mild cognitive deficits prior to his current illness at baseline.   Continue fall/aspiration/delirium precautions   Goals of care Palliative care team consulted to  assist with establishing goals of care Ongoing discussion of goals of care with family.  DNR as of 10/07/2021 -After several conversations with his wife and his daughter, due to his lack of progress, persistent encephalopathy and dysphagia, it was recommended that NG tube be discontinued -PEG tube was discussed, but patient's wife reports that he would not want to have a PEG tube long-term. -After discussing with palliative care, his wife is agreeable to residential hospice referral   Pressure Injury 10/04/21 Buttocks Bilateral Stage 2 -  Partial thickness loss of dermis presenting as a shallow open injury with a red, pink wound bed without slough. (Active) 10/04/21 2300 Location: Buttocks Location Orientation: Bilateral Staging: Stage 2 -  Partial  thickness loss of dermis presenting as a shallow open injury with a red, pink wound bed without slough. Wound Description (Comments): Present on Admission:Yes  I have seen and examined this patient myself. I have spent 34 minutes in his evaluation and care.   DVT Prophylaxis:Lovenox Code Status: DNR Family Communication: None available Disposition Plan: His wife has decided that she wants to take the patient home with hospice. Plan is for discharge tomorrow.  Clara Herbison, DO [facility] Direct contact: see [email or web address]  7PM-7AM contact night coverage as above 10/21/2021, 3:13 PM  LOS: 15 days

## 2021-10-21 NOTE — Progress Notes (Signed)
Hypoglycemic Event  CBG: 63  Treatment: D50 50 mL (25 gm)  Symptoms: None  Follow-up CBG: Time:0753 CBG Result:189  Possible Reasons for Event: Inadequate meal intake  Comments/MD notified: MD updated    Zadie Rhine

## 2021-10-21 NOTE — TOC Progression Note (Addendum)
Transition of Care [facility]) - Progression Note    Patient Details  Name: Ian Weber MRN: [id number] Date of Birth: [birth year]  Transition of Care [facility]) CM/SW Contact  Purcell Mouton, RN Phone Number: 10/21/2021, 10:37 AM  Clinical Narrative:    Spoke with pt's wife to confirm that she will take pt home with [facility]. Referral given to in house rep S.D. CSW with [facility]. Plan to discharge in the AM. Will need PTAR transport.   Expected Discharge Plan: [place] Barriers to Discharge: No Barriers Identified  Expected Discharge Plan and Services Expected Discharge Plan: [place] arrangements for the past 2 months: Single Family Home                                       Social Determinants of Health (SDOH) Interventions    Readmission Risk Interventions No flowsheet data found.

## 2021-10-21 NOTE — Plan of Care (Signed)
°  Problem: Coping: Goal: Psychosocial and spiritual needs will be supported 10/21/2021 1813 by Zadie Rhine, RN Outcome: Progressing 10/21/2021 1645 by Zadie Rhine, RN Outcome: Progressing   Problem: Respiratory: Goal: Will maintain a patent airway 10/21/2021 1813 by Zadie Rhine, RN Outcome: Progressing 10/21/2021 1645 by Zadie Rhine, RN Outcome: Progressing Goal: Complications related to the disease process, condition or treatment will be avoided or minimized 10/21/2021 1813 by Zadie Rhine, RN Outcome: Progressing 10/21/2021 1645 by Zadie Rhine, RN Outcome: Progressing   Problem: Health Behavior/Discharge Planning: Goal: Ability to manage health-related needs will improve Outcome: Progressing   Problem: Clinical Measurements: Goal: Ability to maintain clinical measurements within normal limits will improve Outcome: Progressing Goal: Will remain free from infection Outcome: Progressing Goal: Diagnostic test results will improve Outcome: Progressing Goal: Respiratory complications will improve Outcome: Progressing Goal: Cardiovascular complication will be avoided Outcome: Progressing   Problem: Activity: Goal: Risk for activity intolerance will decrease Outcome: Progressing   Problem: Nutrition: Goal: Adequate nutrition will be maintained Outcome: Progressing   Problem: Coping: Goal: Level of anxiety will decrease Outcome: Progressing   Problem: Elimination: Goal: Will not experience complications related to bowel motility Outcome: Progressing Goal: Will not experience complications related to urinary retention Outcome: Progressing   Problem: Pain Managment: Goal: General experience of comfort will improve Outcome: Progressing   Problem: Safety: Goal: Ability to remain free from injury will improve Outcome: Progressing   Problem: Skin Integrity: Goal: Risk for impaired skin integrity will decrease Outcome: Progressing    Problem: Safety: Goal: Non-violent Restraint(s) Outcome: Progressing

## 2021-10-21 NOTE — Progress Notes (Signed)
Manufacturing engineer [facility]) Hospital Liaison note  Family has decided that they prefer for patient to return home with hospice services rather than pursuing BP. DME (hospital bed, chux and a wheelchair) has been ordered.   Education reiterated that home services are not 24/7 and that primary caregiver is family. Wife/Cally voiced understanding.  Please do not hesitate to call with any hospice related questions.    Thank you for the opportunity to participate in this patient's care.   Daphene Calamity, MSW [facility] Liaison [phone]

## 2021-10-21 NOTE — Plan of Care (Signed)
°  Problem: Coping: Goal: Psychosocial and spiritual needs will be supported Outcome: Progressing   Problem: Respiratory: Goal: Will maintain a patent airway Outcome: Progressing Goal: Complications related to the disease process, condition or treatment will be avoided or minimized Outcome: Progressing   Problem: Health Behavior/Discharge Planning: Goal: Ability to manage health-related needs will improve Outcome: Progressing   Problem: Clinical Measurements: Goal: Ability to maintain clinical measurements within normal limits will improve Outcome: Progressing Goal: Will remain free from infection Outcome: Progressing Goal: Diagnostic test results will improve Outcome: Progressing Goal: Respiratory complications will improve Outcome: Progressing Goal: Cardiovascular complication will be avoided Outcome: Progressing   Problem: Activity: Goal: Risk for activity intolerance will decrease Outcome: Progressing   Problem: Nutrition: Goal: Adequate nutrition will be maintained Outcome: Progressing   Problem: Coping: Goal: Level of anxiety will decrease Outcome: Progressing   Problem: Elimination: Goal: Will not experience complications related to bowel motility Outcome: Progressing Goal: Will not experience complications related to urinary retention Outcome: Progressing   Problem: Pain Managment: Goal: General experience of comfort will improve Outcome: Progressing   Problem: Safety: Goal: Ability to remain free from injury will improve Outcome: Progressing   Problem: Skin Integrity: Goal: Risk for impaired skin integrity will decrease Outcome: Progressing   Problem: Safety: Goal: Non-violent Restraint(s) Outcome: Progressing

## 2021-10-21 NOTE — Progress Notes (Signed)
Manufacturing engineer [facility]) Hospital Liaison note.       Hospice Home is unable to offer a room today. Hospital Liaison will follow up tomorrow or sooner if a room becomes available. Please do not hesitate to call with questions.     Thank you for the opportunity to participate in this patient's care.     Buck Mam [facility] Liaison [phone]

## 2021-10-22 DIAGNOSIS — L89001 Pressure ulcer of unspecified elbow, stage 1: Secondary | ICD-10-CM

## 2021-10-22 LAB — GLUCOSE, CAPILLARY
Glucose-Capillary: 100 mg/dL — ABNORMAL HIGH (ref 70–99)
Glucose-Capillary: 69 mg/dL — ABNORMAL LOW (ref 70–99)
Glucose-Capillary: 75 mg/dL (ref 70–99)
Glucose-Capillary: 75 mg/dL (ref 70–99)
Glucose-Capillary: 75 mg/dL (ref 70–99)
Glucose-Capillary: 93 mg/dL (ref 70–99)

## 2021-10-22 MED ORDER — OXYCODONE HCL 5 MG/5ML PO SOLN: 5.0000 mg | ORAL | 0 refills | Status: DC | PRN

## 2021-10-22 MED ORDER — GLYCOPYRROLATE 0.2 MG/ML IJ SOLN: 0.2000 mg | INTRAMUSCULAR | 0 refills | Status: DC | PRN

## 2021-10-22 MED ORDER — LEVALBUTEROL HCL 0.63 MG/3ML IN NEBU: 0.6300 mg | INHALATION_SOLUTION | Freq: Four times a day (QID) | RESPIRATORY_TRACT | 12 refills | Status: DC | PRN

## 2021-10-22 MED ORDER — NEBULIZER DEVI: 0 refills | Status: DC

## 2021-10-22 MED ORDER — PROCHLORPERAZINE MALEATE 10 MG PO TABS: 10.0000 mg | ORAL_TABLET | ORAL | 0 refills | Status: DC | PRN

## 2021-10-22 MED ORDER — LORAZEPAM 0.5 MG PO TABS: 0.5000 mg | ORAL_TABLET | ORAL | 0 refills | Status: DC | PRN

## 2021-10-22 MED ORDER — LOPERAMIDE HCL 2 MG PO CAPS: 2.0000 mg | ORAL_CAPSULE | Freq: Four times a day (QID) | ORAL | 0 refills | Status: DC | PRN

## 2021-10-22 MED ORDER — HALOPERIDOL 5 MG PO TABS: 5.0000 mg | ORAL_TABLET | ORAL | 0 refills | Status: DC | PRN

## 2021-10-22 MED ORDER — ZINC OXIDE 40 % EX OINT: 1.0000 "application " | TOPICAL_OINTMENT | CUTANEOUS | 0 refills | Status: DC | PRN

## 2021-10-22 MED ORDER — HYOSCYAMINE SULFATE 0.125 MG SL SUBL: 0.1250 mg | SUBLINGUAL_TABLET | SUBLINGUAL | 0 refills | Status: DC | PRN

## 2021-10-22 NOTE — Progress Notes (Signed)
Wife called home equipment has been been delivered. Called TOC, weekend SW to assist. [facility]

## 2021-10-22 NOTE — Progress Notes (Signed)
Daily Progress Note   Patient Name: Ian Weber       Date: 10/22/2021 DOB: 10-13-1933  Age: 88 y.o. MRN#: [id number] Attending Physician: Karie Kirks, DO Primary Care Physician: Jilda Panda, MD Admit Date: 10/04/2021  Reason for Consultation/Follow-up: Establishing goals of care  Subjective: I saw and examined Mr. Gowdy today.    He was lying in bed in no distress was more awake and interactive today than he has been throughout the week.  His wife has talked with staff and hospice and wants to work to take him home with hospice support.  She understands that hospice does not provide 24-hour in-home care.  We have reviewed home hospice services and contrast between this and residential hospice.  She is clear in her desire to transition home with hospice support.  Length of Stay: 18  Current Medications: Scheduled Meds:   chlorhexidine  15 mL Mouth Rinse BID   enoxaparin (LOVENOX) injection  1 mg/kg Subcutaneous BID   levothyroxine  100 mcg Oral Q0600   mouth rinse  15 mL Mouth Rinse q12n4p    Continuous Infusions:    PRN Meds: glycopyrrolate, levalbuterol, lip balm, liver oil-zinc oxide, loperamide, oxyCODONE  Physical Exam   General: Confused but more awake. Heart: Regular rate and rhythm. No murmur appreciated. Lungs: Coarse throughout Abdomen: Soft, nontender, nondistended, positive bowel sounds.   Ext: No significant edema        Vital Signs: BP 107/74 (BP Location: Left Arm)    Pulse 72    Temp (!) 97.5 F (36.4 C) (Oral)    Resp 13    Ht 6' (1.829 m)    Wt 106.8 kg    SpO2 100%    BMI 31.93 kg/m  SpO2: SpO2: 100 % O2 Device: O2 Device: Room Air O2 Flow Rate: O2 Flow Rate (L/min): 2 L/min  Intake/output summary:  Intake/Output Summary (Last 24 hours) at  10/22/2021 0927 Last data filed at 10/22/2021 0419 Gross per 24 hour  Intake 440 ml  Output 850 ml  Net -410 ml    LBM: Last BM Date: 10/14/21 (per chart) Baseline Weight: Weight: 72.6 kg Most recent weight: Weight: 106.8 kg       Palliative Assessment/Data:      Patient Active Problem List   Diagnosis Date Noted   Pressure  injury of skin 10/05/2021   COVID-19 virus infection 10/05/2021   Elevated troponin 10/05/2021   Dementia without behavioral disturbance ([place]) 10/05/2021   HTN (hypertension) 10/05/2021   AMS (altered mental status) 10/04/2021   Anxiety 09/24/2017   GIB (gastrointestinal bleeding) 09/24/2017   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   Constipation 01/02/2012   BRBPR (bright red blood per rectum) 01/02/2012   VITAMIN D DEFICIENCY 10/22/2009   Diverticulosis of colon with hemorrhage 10/22/2009   ABDOMINAL PAIN, EPIGASTRIC 10/22/2009   THYROID CANCER, HX OF 10/22/2009   HYPOTHYROIDISM, POSTSURGICAL 10/20/2009   Essential hypertension 10/20/2009   ESOPHAGEAL STRICTURE 10/20/2009   GERD 10/20/2009   HIATAL HERNIA 10/20/2009    Palliative Care Assessment & Plan   Patient Profile: 88 year old male with past medical history of dementia, hypertension, hypothyroidism, GI bleed admitted with weakness, altered mental status, and excess secretions with increased work of breathing in light of known COVID-19 infection.  Recommendations/Plan: DNR/DNI Continue feeds for comfort as tolerated. His wife would now like to transition home with hospice support rather than going to residential hospice. Likely discharge home tomorrow with home hospice.  Care plan was discussed with wife  Thank you for allowing the Palliative Medicine Team to assist in the care of this patient.  Micheline Rough, MD  Please contact Palliative Medicine Team phone at [phone] for questions and concerns.

## 2021-10-22 NOTE — Plan of Care (Signed)
  Problem: Coping: Goal: Psychosocial and spiritual needs will be supported Outcome: Progressing   Problem: Respiratory: Goal: Will maintain a patent airway Outcome: Progressing Goal: Complications related to the disease process, condition or treatment will be avoided or minimized Outcome: Progressing   

## 2021-10-22 NOTE — Progress Notes (Signed)
[place] for enoxaparin Indication: acute pulmonary embolus  Allergies  Allergen Reactions   Amlodipine Other (See Comments)    Reaction not recalled by patient   Chlorthalidone Other (See Comments)    Reaction not recalled by patient   Codeine Phosphate Other (See Comments)    Weakness, sweating   Hydrochlorothiazide W-Triamterene Other (See Comments)    Reaction not recalled by patient   Prednisone Itching   Sulfamethoxazole Itching   Triamterene Other (See Comments)    Reaction not recalled by patient    Patient Measurements: Height: 6' (182.9 cm) Weight: 106.8 kg (235 lb 7.2 oz) IBW/kg (Calculated) : 77.6  Vital Signs:    Labs: No results for input(s): HGB, HCT, PLT, APTT, LABPROT, INR, HEPARINUNFRC, HEPRLOWMOCWT, CREATININE, CKTOTAL, CKMB, TROPONINIHS in the last 72 hours.  Estimated Creatinine Clearance: 85.3 mL/min (by C-G formula based on SCr of 0.62 mg/dL).  Assessment: Patient is an 88 y.o M who was recently diagnosed with COVID-19 presented to the ED on 10/04/21 with AMS. Troponin was found to be elevated, but cardiology does not think clinical picture is consistent with ACS.  D-dimer on 10/05/21 was found to be >20 and chest CTA came back positive for bilateral PE. Pharmacy initially consulted for IV heparin, then transitioned to SQ enoxaparin on 10/09/21. Enoxaparin held on 10/10/21 for planned LP on 10/11/21. Per notes, LP was unsuccessful despite total of 4 attempts. Pt transitioned back to therapeutic LMWH with no further procedures planned.   Today, 10/22/21 Patient remains NPO, unable to transition/consider oral anticoagulation Renal function WNL last on 1/10 CBC (las on 1/10): Hgb remains low/stable at 12.2, Plt WNL Note that initial TBW charted as 74.8kg (10/11/21) > 72.6 kg > 106.3 & 106.8 kg (10/19/21).  RN notes that pt does not appear over 200 lb, currently need to use bed scale, will continue using initial weight as  dosing weight.  Plan:  Continue enoxaparin 1 mg/kg subQ q12h, dose 75mg  SQ bid Recheck labs weekly if needed, or may defer given hospice status.    Gretta Arab PharmD, BCPS Clinical Pharmacist [facility] main pharmacy [phone] 10/22/2021 11:37 AM

## 2021-10-22 NOTE — Discharge Summary (Signed)
Physician Discharge Summary  Ian Weber NWG:[id number] DOB: 01/12/1934 DOA: 10/04/2021  PCP: Jilda Panda, MD  Admit date: 10/04/2021 Discharge date: 10/22/2021  Recommendations for Outpatient Follow-up:  Discharge to home with hospice for end of life care.  Discharge Diagnoses: Principal diagnosis is #1 Acute encephalopathy in the setting of dementia  COVID-19 Community acquired pneumonia left lower lobe with parapneumonic effusion Bilateral acute pulmonary embolism Acute hypoxic respiratory failure due to CAP and pulmonary embolism Elevated troponin due to demand ischemia Prolonged QTc Hypokalemia Pressure injury of bl buttocks stage 2 Hematuria Poor oral intake   Discharge Condition: fair  Disposition: Home with hospice  Diet recommendation: Soft  Filed Weights   10/15/21 0609 10/18/21 0500 10/19/21 0500  Weight: 72.6 kg 106.3 kg 106.8 kg    History of present illness:   Ian Weber is a 88 y.o. male with medical history significant for dementia, hypertension, hypothyroidism, history of GI bleed who presents with concerns of increasing altered mental status.    Wife at bedside provides history as patient only responsive to noxious stimuli. He tested positive for COVID about 6 days ago and since then has had decreased appetite.  Usually at baseline he is able to help wife with transfer but for the last couple days has become increasingly more bedbound.  Patient has been grunting, not able to carry conversation or opening his eyes. Has not complaint of pain but wife noticed increase labored respiration.    ED Course: He was afebrile, normotensive on room air. CBC unremarkable.  Lactate of 2. Sodium of 148, K of 3.9, creatinine of 1.01, BG of 116.   Troponin of 4339 with EKG showing RBBB and left anterior fascicular block.  Cardiology was consulted by ED PA who feels this more likely demand ischemia and does not recommend starting IV heparin unless he has a  significant delta change on the next troponin.   Review of Systems: Patient unable to provide for ROS since he was obtunded   Hospital Course:  Ian Weber is a 88 y.o. male with medical history significant for dementia, hypertension, hypothyroidism, history of GI bleed who presents from home with concerns of increasing altered mental status and generalized weakness.  Associated with poor appetite and poor oral intake.  Was brought to the ED for further evaluation.  Recent COVID-19 positive 09/28/21.  Started on antiviral IV remdesivir.   Hospital course complicated by findings of B/L pulmonary embolism, started hep drip 10/05/21. Minimal responsiveness, restless agitation delirium reported early morning 12/30.  Mental status has failed to make meaningful improvements and he continues to have significant dysphagia.  He is unable to swallow without aspiration.  After several discussions with the patient's wife and daughter, his wife has elected to proceed with referral to residential hospice, [facility]   The patient's speech has been unintelligible, and he has had difficulty swallowing.    The patient's family is very opposed to the use of Morphine. They equate that with euthanasia.   The patient's wife has decided to take the patient home with hospice. Plan is for discharge to home with hospice today.   Today's assessment: S: The patient is resting comfortably. No new complaints. O: Vitals:  Vitals:   10/21/21 1939 10/22/21 1138  BP:  117/76  Pulse:  67  Resp:  14  Temp: (!) 97.5 F (36.4 C) 97.6 F (36.4 C)  SpO2:  97%    Exam:  Constitutional:  The patient is awake, alert, and oriented  x 3. No acute distress. Respiratory:  No increased work of breathing. No wheezes, rales, or rhonchi No tactile fremitus Cardiovascular:  Regular rate and rhythm No murmurs, ectopy, or gallups. No lateral PMI. No thrills. Abdomen:  Abdomen is soft, non-tender, non-distended No  hernias, masses, or organomegaly Normoactive bowel sounds.  Musculoskeletal:  No cyanosis, clubbing, or edema Skin:  No rashes, lesions, ulcers palpation of skin: no induration or nodules Neurologic:  CN 2-12 intact Sensation all 4 extremities intact Psychiatric:  Mental status Mood, affect appropriate Orientation to person, place, time  judgment and insight appear intact  Discharge Instructions  Discharge Instructions     Call MD for:  severe uncontrolled pain   Complete by: As directed    Diet - low sodium heart healthy   Complete by: As directed    Discharge instructions   Complete by: As directed    Discharge to home with hospice for end of life care.   Discharge wound care:   Complete by: As directed    Apply Desitin cream and then apply antifungal powder to buttocks/perineum TID and PRN when turning and cleaning; leave foam dressing off   Increase activity slowly   Complete by: As directed       Allergies as of 10/22/2021       Reactions   Amlodipine Other (See Comments)   Reaction not recalled by patient   Chlorthalidone Other (See Comments)   Reaction not recalled by patient   Codeine Phosphate Other (See Comments)   Weakness, sweating   Hydrochlorothiazide W-triamterene Other (See Comments)   Reaction not recalled by patient   Prednisone Itching   Sulfamethoxazole Itching   Triamterene Other (See Comments)   Reaction not recalled by patient        Medication List     STOP taking these medications    furosemide 40 MG tablet Commonly known as: LASIX   losartan 25 MG tablet Commonly known as: COZAAR   nebivolol 2.5 MG tablet Commonly known as: BYSTOLIC   polyethylene glycol powder 17 GM/SCOOP powder Commonly known as: GLYCOLAX/MIRALAX   senna-docusate 8.6-50 MG tablet Commonly known as: Senokot-S       TAKE these medications    aspirin 81 MG tablet Take 1 tablet (81 mg total) by mouth daily.   glycopyrrolate 0.2 MG/ML  injection Commonly known as: ROBINUL Inject 1 mL (0.2 mg total) into the vein every 4 (four) hours as needed (excess secretions).   haloperidol 5 MG tablet Commonly known as: HALDOL Take 1 tablet (5 mg total) by mouth every 4 (four) hours as needed for agitation. May crush, mix with water and give sublingually if needed.   hyoscyamine 0.125 MG SL tablet Commonly known as: LEVSIN SL Place 1 tablet (0.125 mg total) under the tongue every 4 (four) hours as needed (excess oral secretions).   levalbuterol 0.63 MG/3ML nebulizer solution Commonly known as: XOPENEX Take 3 mLs (0.63 mg total) by nebulization every 6 (six) hours as needed for wheezing or shortness of breath.   levothyroxine 100 MCG tablet Commonly known as: SYNTHROID Take 100 mcg by mouth daily.   liver oil-zinc oxide 40 % ointment Commonly known as: DESITIN Apply 1 application topically every 4 (four) hours as needed (for wound. see adm instructions).   loperamide 2 MG capsule Commonly known as: IMODIUM Take 1 capsule (2 mg total) by mouth every 6 (six) hours as needed for diarrhea or loose stools.   LORazepam 0.5 MG tablet Commonly known as: ATIVAN  Take 1 tablet (0.5 mg total) by mouth every 4 (four) hours as needed for anxiety. May crush, mix with water and give sublingually if needed.   Nebulizer Chisholm Use as directed.   oxyCODONE 5 MG/5ML solution Commonly known as: ROXICODONE Take 5 mLs (5 mg total) by mouth every 4 (four) hours as needed for severe pain (or shortness of breath).   prochlorperazine 10 MG tablet Commonly known as: COMPAZINE Take 1 tablet (10 mg total) by mouth every 4 (four) hours as needed for nausea or vomiting. May crush, mix with water and give sublingually.               Discharge Care Instructions  (From admission, onward)           Start     Ordered   10/22/21 0000  Discharge wound care:       Comments: Apply Desitin cream and then apply antifungal powder to  buttocks/perineum TID and PRN when turning and cleaning; leave foam dressing off   10/22/21 1215           Allergies  Allergen Reactions   Amlodipine Other (See Comments)    Reaction not recalled by patient   Chlorthalidone Other (See Comments)    Reaction not recalled by patient   Codeine Phosphate Other (See Comments)    Weakness, sweating   Hydrochlorothiazide W-Triamterene Other (See Comments)    Reaction not recalled by patient   Prednisone Itching   Sulfamethoxazole Itching   Triamterene Other (See Comments)    Reaction not recalled by patient    The results of significant diagnostics from this hospitalization (including imaging, microbiology, ancillary and laboratory) are listed below for reference.    Significant Diagnostic Studies: DG Abd 1 View  Result Date: 10/07/2021 CLINICAL DATA:  NG placement. EXAM: ABDOMEN - 1 VIEW COMPARISON:  Abdominal radiograph dated 02/26/2021. FINDINGS: Evaluation is limited due to overlying support wires. Enteric tube extends below the diaphragm into the distal stomach. The tube fold back on itself with tip in the proximal stomach. Air is noted within the colon. IMPRESSION: Enteric tube with tip in the proximal stomach. Electronically Signed   By: Anner Crete M.D.   On: 10/07/2021 01:09   CT HEAD WO CONTRAST (5MM)  Result Date: 10/08/2021 CLINICAL DATA:  Delirium.  Sepsis.  COVID positive. EXAM: CT HEAD WITHOUT CONTRAST TECHNIQUE: Contiguous axial images were obtained from the base of the skull through the vertex without intravenous contrast. COMPARISON:  CT head without contrast 10/04/2021 FINDINGS: Brain: Moderate atrophy and white matter changes are stable. No acute infarct, hemorrhage, or mass lesion is present. Remote lacunar infarcts are present in the thalami bilaterally. White matter changes extend into the brainstem. Cerebellum is unremarkable. The ventricles are proportionate to the degree of atrophy. No significant extraaxial  fluid collection is present. Vascular: Atherosclerotic changes are present within the cavernous internal carotid arteries. No hyperdense vessel is present. Skull: Calvarium is intact. No focal lytic or blastic lesions are present. No significant extracranial soft tissue lesion is present. Sinuses/Orbits: The paranasal sinuses and mastoid air cells are clear. NG tube enters on the right. Bilateral lens replacements are noted. Globes and orbits are otherwise unremarkable. IMPRESSION: 1. Stable atrophy and white matter disease. This likely reflects the sequela of chronic microvascular ischemia. 2. No acute intracranial abnormality or significant interval change. Electronically Signed   By: San Morelle M.D.   On: 10/08/2021 14:04   CT Head Wo Contrast  Result Date: 10/04/2021 CLINICAL  DATA:  88 year old male with a history of mental status changes EXAM: CT HEAD WITHOUT CONTRAST TECHNIQUE: Contiguous axial images were obtained from the base of the skull through the vertex without intravenous contrast. COMPARISON:  None. FINDINGS: Brain: No acute intracranial hemorrhage. No midline shift or mass effect. Gray-white differentiation maintained. Confluent hypodensity in the bilateral periventricular white matter predominantly posteriorly. Unremarkable appearance of the ventricular system. Vascular: Calcifications of the intracranial vasculature. Skull: No acute fracture.  No aggressive bone lesion identified. Sinuses/Orbits: Unremarkable appearance of the orbits. Mastoid air cells clear. No middle ear effusion. No significant sinus disease. Other: None IMPRESSION: Negative for acute intracranial abnormality. Changes of chronic microvascular ischemic disease and associated intracranial atherosclerosis Electronically Signed   By: Corrie Mckusick D.O.   On: 10/04/2021 17:12   CT Angio Chest Pulmonary Embolism (PE) W or WO Contrast  Result Date: 10/05/2021 CLINICAL DATA:  Pulmonary embolism suspected, high  probability. COVID positive. Elevated D-dimer. EXAM: CT ANGIOGRAPHY CHEST WITH CONTRAST TECHNIQUE: Multidetector CT imaging of the chest was performed using the standard protocol during bolus administration of intravenous contrast. Multiplanar CT image reconstructions and MIPs were obtained to evaluate the vascular anatomy. CONTRAST:  75mL OMNIPAQUE IOHEXOL 350 MG/ML SOLN COMPARISON:  Chest radiograph 10/04/2021 FINDINGS: Cardiovascular: Positive for pulmonary embolism. Lobar and segmental clot involving left upper and left lower lobes. Lobar and segmental clot in the right upper lobe. There is probably a small amount of distal thrombus in the right lower lobe but this area is limited due to motion artifact. No thrombus in the main pulmonary arteries. Limited evaluation for right heart strain due to motion artifact but overall there does not appear to be significant right heart strain. Heart size is normal without significant pericardial fluid. Normal caliber of the thoracic aorta. Mediastinum/Nodes: Evidence for thyroidectomy. No significant mediastinal or hilar lymphadenopathy. No axillary lymph node enlargement. Lungs/Pleura: Focal consolidation and airspace densities in the posterior left lower lobe. No pleural effusions. Few patchy densities in the posterior right lower lobe may represent atelectasis. Upper Abdomen: Partially imaged low-density structure near the left adrenal gland on sequence 5 image 103 that measures 3.1 x 2.5 cm. This lesion appears to be separate from the pancreas but the entire upper abdomen is poorly visualized. Musculoskeletal: No acute bone abnormality. Review of the MIP images confirms the above findings. IMPRESSION: 1. Positive for bilateral pulmonary embolism. Clot burden is small to moderate in size and primarily involving segmental branches. Limited evaluation for right heart strain although there does not appear to be significant right heart strain on this examination. If there is  clinical concern for right heart strain, recommend further characterization with echocardiography. 2. Consolidation in the posterior left lower lobe. Findings could be related to infarct and/or pneumonia. 3. Indeterminate low-density structure/lesion in the left upper abdomen near the left adrenal gland. This area is incompletely imaged but measures up to 3.1 cm. Neoplastic process cannot be excluded. Recommend dedicated imaging of the abdomen and pelvis. These results were called by telephone at the time of interpretation on 10/05/2021 at 3:02 pm to provider [facility] , who verbally acknowledged these results. Electronically Signed   By: Markus Daft M.D.   On: 10/05/2021 15:03   MR BRAIN WO CONTRAST  Result Date: 10/09/2021 CLINICAL DATA:  Initial evaluation for acute delirium. EXAM: MRI HEAD WITHOUT CONTRAST TECHNIQUE: Multiplanar, multiecho pulse sequences of the brain and surrounding structures were obtained without intravenous contrast. COMPARISON:  Comparison made with prior head CT from 10/08/2021. FINDINGS:  Brain: Cerebral volume within normal limits for age. Patchy T2/FLAIR hyperintensity involving the periventricular and deep white matter both cerebral hemispheres as well as the pons, most consistent with chronic small vessel ischemic disease, moderate in nature. No abnormal foci of restricted diffusion to suggest acute or subacute ischemia. Gray-white matter differentiation maintained. No encephalomalacia to suggest chronic cortical infarction. No evidence for acute or chronic intracranial hemorrhage. No mass lesion, midline shift or mass effect. No hydrocephalus or extra-axial fluid collection. Pituitary gland suprasellar region normal. Midline structures intact. Vascular: Major intracranial vascular flow voids are maintained. Skull and upper cervical spine: Craniocervical junction within normal limits. Bone marrow signal intensity grossly normal. No scalp soft tissue abnormality. Sinuses/Orbits:  Patient status post bilateral ocular lens replacement. Globes and orbital soft tissues demonstrate no acute finding. Mild scattered mucosal thickening noted within the ethmoidal air cells. Paranasal sinuses are otherwise clear. No mastoid effusion. Other: None. IMPRESSION: 1. No acute intracranial abnormality. 2. Moderate chronic microvascular ischemic disease. Electronically Signed   By: Jeannine Boga M.D.   On: 10/09/2021 20:40   CT CHEST ABDOMEN PELVIS W CONTRAST  Result Date: 10/08/2021 CLINICAL DATA:  88 year old male with history of sepsis. COVID positive patient. EXAM: CT CHEST, ABDOMEN, AND PELVIS WITH CONTRAST TECHNIQUE: Multidetector CT imaging of the chest, abdomen and pelvis was performed following the standard protocol during bolus administration of intravenous contrast. CONTRAST:  66mL OMNIPAQUE IOHEXOL 350 MG/ML SOLN COMPARISON:  Chest CT 10/05/2021. FINDINGS: CT CHEST FINDINGS Cardiovascular: Heart size is normal. There is no significant pericardial fluid, thickening or pericardial calcification. There is aortic atherosclerosis, as well as atherosclerosis of the great vessels of the mediastinum and the coronary arteries, including calcified atherosclerotic plaque in the left anterior descending and right coronary arteries. Mediastinum/Nodes: No pathologically enlarged mediastinal or hilar lymph nodes. Nasogastric tube extending into the stomach. Esophagus is otherwise unremarkable in appearance. Postoperative changes of thyroidectomy. No axillary lymphadenopathy. Lungs/Pleura: There is a combination of atelectasis and airspace consolidation in the left lower lobe. Some dependent subsegmental atelectasis is also noted in the right lower lobe. Trace left pleural fluid. No right pleural effusion. No definite suspicious appearing pulmonary nodules or masses are noted. Musculoskeletal: There are no aggressive appearing lytic or blastic lesions noted in the visualized portions of the skeleton.  CT ABDOMEN PELVIS FINDINGS Hepatobiliary: No suspicious cystic or solid hepatic lesions. No intra or extrahepatic biliary ductal dilatation. Gallbladder is normal in appearance. Pancreas: No pancreatic mass. No pancreatic ductal dilatation. No pancreatic or peripancreatic fluid collections or inflammatory changes. Spleen: Unremarkable. Adrenals/Urinary Tract: Low-attenuation lesions in the lower pole of the right kidney compatible with simple cysts. The largest of these is exophytic measuring 6.7 x 6.0 cm. Left kidney and right adrenal gland are normal in appearance. Heterogeneously enhancing mass in the left adrenal gland measuring 3.3 x 2.8 cm. No hydroureteronephrosis. In the right-side of the urinary bladder (axial image 111 of series 3) there is a 6 x 2 mm (mean diameter of 4 mm) calculus. Urinary bladder is otherwise unremarkable in appearance. Stomach/Bowel: Nasogastric tube coiled upon itself in the stomach, with the tip in the proximal stomach. Stomach is otherwise unremarkable in appearance. No pathologic dilatation of small bowel or colon. A few scattered colonic diverticulae are noted, particularly in the proximal sigmoid colon, without surrounding inflammatory changes to suggest an acute diverticulitis at this time. Normal appendix. Vascular/Lymphatic: Aortic atherosclerosis. No lymphadenopathy noted in the abdomen or pelvis. Reproductive: Prostate gland and seminal vesicles are unremarkable in appearance. Other:  Small right inguinal hernia containing a short segment of mid to distal small bowel. No significant volume of ascites. No pneumoperitoneum. Musculoskeletal: Superior endplate compression fracture of L2 with approximately 30% loss of central vertebral body height. There are no aggressive appearing lytic or blastic lesions noted in the visualized portions of the skeleton. IMPRESSION: 1. Left lower lobe pneumonia. 2. No acute findings are noted in the abdomen or pelvis. 3. Heterogeneously  enhancing left adrenal mass measuring 3.3 x 2.8 cm. This is incompletely characterized and could represent a benign or malignant lesion. Further evaluation with adrenal protocol CT scan should be considered. 4. Colonic diverticulosis without evidence of acute diverticulitis at this time. 5. Aortic atherosclerosis, in addition to 2 vessel coronary artery disease. 6. Additional incidental findings, as above. Electronically Signed   By: Vinnie Langton M.D.   On: 10/08/2021 14:40   DG CHEST PORT 1 VIEW  Result Date: 10/11/2021 CLINICAL DATA:  88 year old male with abnormal pulmonary auscultation. Recent shortness of breath, sepsis, pneumonia. EXAM: PORTABLE CHEST 1 VIEW COMPARISON:  Portable chest 10/09/2021 and earlier. FINDINGS: Portable AP semi upright view at 1022 hours. Enteric tube remains in place and appears looped in the stomach. Stable thyroid surgical clips at the thoracic inlet. Continued dense left lung base and now increasing right lower lung peribronchial opacity. Mediastinal contours remain within normal limits. No superimposed pneumothorax or pulmonary edema. No definite pleural effusion. Negative visible bowel gas. No acute osseous abnormality identified. IMPRESSION: 1. Continued left lower lobe consolidation and increasing right lower lung peribronchial opacity suspicious for progressive bilateral pneumonia. 2. Enteric tube in the stomach. Electronically Signed   By: Genevie Ann M.D.   On: 10/11/2021 10:39   DG CHEST PORT 1 VIEW  Result Date: 10/09/2021 CLINICAL DATA:  Shortness of breath. EXAM: PORTABLE CHEST 1 VIEW COMPARISON:  October 04, 2021. FINDINGS: Stable cardiomediastinal silhouette. Nasogastric tube is seen entering stomach. Right lung is clear. New left lower lobe opacity is noted concerning for pneumonia or atelectasis with associated effusion. Bony thorax is unremarkable. IMPRESSION: New left lower lobe opacity is noted concerning for pneumonia or atelectasis with associated pleural  effusion. Electronically Signed   By: Marijo Conception M.D.   On: 10/09/2021 13:55   DG Chest Port 1 View  Result Date: 10/04/2021 CLINICAL DATA:  Altered mental status COVID EXAM: PORTABLE CHEST 1 VIEW COMPARISON:  09/28/2021 FINDINGS: Postsurgical changes at the thoracic inlet. No focal opacity, pleural effusion or pneumothorax. Normal cardiomediastinal silhouette. IMPRESSION: No active disease. Electronically Signed   By: Donavan Foil M.D.   On: 10/04/2021 17:21   DG Chest Portable 1 View  Result Date: 09/28/2021 CLINICAL DATA:  Increasing confusion for to several weeks with cough, initial encounter EXAM: PORTABLE CHEST 1 VIEW COMPARISON:  09/04/2013 FINDINGS: Cardiac shadow is stable. Postsurgical changes in the neck are noted consistent with prior thyroidectomy. Lungs are clear bilaterally. No bony abnormality is seen. IMPRESSION: No active disease. Electronically Signed   By: Inez Catalina M.D.   On: 09/28/2021 20:50   EEG adult  Result Date: 10/10/2021 Lora Havens, MD     10/10/2021 11:23 AM Patient Name: Ian Weber MRN: [id number] Epilepsy Attending: Lora Havens Referring Physician/Provider: Dr Kerney Elbe Date: 10/10/2021 Duration: 22.01 mins Patient history: 88 year old male with AMS in the setting of Covid infection and dementia. EEG to evaluate for seizure Level of alertness:  lethargic AEDs during EEG study: None Technical aspects: This EEG study was done with scalp  electrodes positioned according to the 10-20 International system of electrode placement. Electrical activity was acquired at a sampling rate of 500Hz  and reviewed with a high frequency filter of 70Hz  and a low frequency filter of 1Hz . EEG data were recorded continuously and digitally stored. Description: EEG showed continuous generalized 3 to 6 Hz theta-delta slowing. Hyperventilation and photic stimulation were not performed.   ABNORMALITY - Continuous slow, generalized IMPRESSION: This study is suggestive of moderate  diffuse encephalopathy, nonspecific etiology. No seizures or epileptiform discharges were seen throughout the recording. Lora Havens   ECHOCARDIOGRAM COMPLETE  Result Date: 10/05/2021    ECHOCARDIOGRAM REPORT   Patient Name:   Ian Weber Date of Exam: 10/05/2021 Medical Rec #:  [id number]     Height:       72.0 in Accession #:    [id number]    Weight:       160.0 lb Date of Birth:  September 22, 1934     BSA:          1.938 m Patient Age:    55 years      BP:           109/83 mmHg Patient Gender: M             HR:           94 bpm. Exam Location:  Inpatient Procedure: 2D Echo and Intracardiac Opacification Agent Indications:    Elevated Troponin  History:        Patient has no prior history of Echocardiogram examinations.                 Risk Factors:Hypertension.  Sonographer:    Glo Herring Referring Phys: [id number] Liborio Negron Torres T [facility]  Sonographer Comments: Technically difficult study due to poor echo windows. Very poor apical windows despite use of definity contrast IMPRESSIONS  1. Endocardial border definition is poor despite the use of Definity contrast. Overall, systolic function appears to be normal. Left ventricular ejection fraction, by estimation, is 55 to 60%. The left ventricle has normal function. The left ventricle has no regional wall motion abnormalities. Left ventricular diastolic parameters are consistent with Grade I diastolic dysfunction (impaired relaxation).  2. Right ventricular systolic function is normal. The right ventricular size is normal. There is normal pulmonary artery systolic pressure.  3. The mitral valve is normal in structure. No evidence of mitral valve regurgitation. No evidence of mitral stenosis.  4. The aortic valve is normal in structure. Aortic valve regurgitation is not visualized. No aortic stenosis is present.  5. The inferior vena cava is dilated in size with >50% respiratory variability, suggesting right atrial pressure of 8 mmHg. FINDINGS  Left Ventricle: Endocardial  border definition is poor despite the use of Definity contrast. Overall, systolic function appears to be normal. Left ventricular ejection fraction, by estimation, is 55 to 60%. The left ventricle has normal function. The left ventricle has no regional wall motion abnormalities. Definity contrast agent was given IV to delineate the left ventricular endocardial borders. The left ventricular internal cavity size was normal in size. There is no left ventricular hypertrophy. Left ventricular diastolic parameters are consistent with Grade I diastolic dysfunction (impaired relaxation). Normal left ventricular filling pressure. Right Ventricle: The right ventricular size is normal. No increase in right ventricular wall thickness. Right ventricular systolic function is normal. There is normal pulmonary artery systolic pressure. The tricuspid regurgitant velocity is 2.42 m/s, and  with an assumed right atrial pressure of 8 mmHg, the estimated  right ventricular systolic pressure is 49.6 mmHg. Left Atrium: Left atrial size was normal in size. Right Atrium: Right atrial size was normal in size. Pericardium: There is no evidence of pericardial effusion. Mitral Valve: The mitral valve is normal in structure. No evidence of mitral valve regurgitation. No evidence of mitral valve stenosis. Tricuspid Valve: The tricuspid valve is normal in structure. Tricuspid valve regurgitation is mild . No evidence of tricuspid stenosis. Aortic Valve: The aortic valve is normal in structure. Aortic valve regurgitation is not visualized. No aortic stenosis is present. Pulmonic Valve: The pulmonic valve was normal in structure. Pulmonic valve regurgitation is not visualized. No evidence of pulmonic stenosis. Aorta: The aortic root is normal in size and structure. Venous: The inferior vena cava is dilated in size with greater than 50% respiratory variability, suggesting right atrial pressure of 8 mmHg. IAS/Shunts: No atrial level shunt detected by  color flow Doppler.  LEFT VENTRICLE PLAX 2D LVOT diam:     2.10 cm   Diastology LVOT Area:     3.46 cm  LV e' medial:    11.00 cm/s                          LV E/e' medial:  5.2                          LV e' lateral:   6.42 cm/s                          LV E/e' lateral: 8.9  RIGHT VENTRICLE            IVC RV S prime:     9.79 cm/s  IVC diam: 2.50 cm  AORTA Ao Root diam: 3.50 cm MITRAL VALVE               TRICUSPID VALVE MV Area (PHT): 5.50 cm    TR Peak grad:   23.4 mmHg MV Decel Time: 138 msec    TR Vmax:        242.00 cm/s MV E velocity: 57.40 cm/s MV A velocity: 74.60 cm/s  SHUNTS MV E/A ratio:  0.77        Systemic Diam: 2.10 cm Skeet Latch MD Electronically signed by Skeet Latch MD Signature Date/Time: 10/05/2021/2:28:36 PM    Final    DG FLUORO GUIDE LUMBAR PUNCTURE  Result Date: 10/11/2021 CLINICAL DATA:  Mental status changes.  COVID-19 positive. EXAM: DIAGNOSTIC LUMBAR PUNCTURE UNDER FLUOROSCOPIC GUIDANCE COMPARISON:  Brain MR 10/09/2021 FLUOROSCOPY TIME:  Fluoroscopy Time:  4 minutes and 18 seconds Radiation Exposure Index (if provided by the fluoroscopic device): 50.2 mGy Number of Acquired Spot Images: 0 PROCEDURE: Informed consent was obtained from the patient's wife prior to the procedure, including potential complications of headache, allergy, and pain. With the patient prone, the lower back was prepped with Betadine. 1% Lidocaine was used for local anesthesia. Lumbar puncture was initially attempted at the L3-4 level. Despite appropriate/optimal needle placement, no CSF could be returned. The second attempt was performed with the patient at 10 degrees incline. Subsequently, the L2-3 level was localized. Two more attempts were made with a new needle at this level. Despite good position of the needle, CSF could not be obtained. The patient tolerated the procedure well and there were no apparent complications. IMPRESSION: Unsuccessful lumbar puncture, despite a total of 4 attempts at 2  different levels.  Despite good needle positioning, CSF did not return on any placement. Suspect this is secondary to low intracranial pressure in this patient who was sleeping throughout the procedure. Electronically Signed   By: Abigail Miyamoto M.D.   On: 10/11/2021 16:36    Microbiology: No results found for this or any previous visit (from the past 240 hour(s)).   Labs: Basic Metabolic Panel: Recent Labs  Lab 10/18/21 0345  NA 136  K 4.7  CL 107  CO2 26  GLUCOSE 79  BUN 12  CREATININE 0.62  CALCIUM 8.0*   Liver Function Tests: No results for input(s): AST, ALT, ALKPHOS, BILITOT, PROT, ALBUMIN in the last 168 hours. No results for input(s): LIPASE, AMYLASE in the last 168 hours. No results for input(s): AMMONIA in the last 168 hours. CBC: Recent Labs  Lab 10/16/21 0407 10/17/21 0408 10/18/21 0345  WBC 9.2 10.6* 9.5  HGB 12.3* 12.1* 12.2*  HCT 38.9* 37.1* 38.4*  MCV 94.0 92.3 94.1  PLT 374 370 359   Cardiac Enzymes: No results for input(s): CKTOTAL, CKMB, CKMBINDEX, TROPONINI in the last 168 hours. BNP: BNP (last 3 results) Recent Labs    10/05/21 0359 10/06/21 0729  BNP 181.6* 394.0*    ProBNP (last 3 results) No results for input(s): PROBNP in the last 8760 hours.  CBG: Recent Labs  Lab 10/21/21 1935 10/21/21 2359 10/22/21 0414 10/22/21 0721 10/22/21 1137  GLUCAP 120* 75 75 75 93    Principal Problem:   AMS (altered mental status) Active Problems:   Pressure injury of skin   COVID-19 virus infection   Elevated troponin   Dementia without behavioral disturbance (HCC)   HTN (hypertension)   Time coordinating discharge: 38 minutes.  Signed:        Lukasz Rogus, DO [facility]  10/22/2021, 2:20 PM

## 2021-10-22 NOTE — Progress Notes (Signed)
Pt discharged to home via [place], Pt alert. Left condom catheter in place. Wound open to air.  Discharge instructions reviewed with spouse this am, acknowledged understanding. SRP, RN

## 2021-10-22 NOTE — Progress Notes (Signed)
Hypoglycemic Event  CBG: 69  Treatment: 8 oz juice/soda  Symptoms: None  Follow-up CBG: Time:1726  CBG Result:100  Possible Reasons for Event: decreased oral intake  Comments/MD notified: none    Theora Gianotti R

## 2021-10-22 NOTE — TOC Transition Note (Signed)
Transition of Care [facility]) - CM/SW Discharge Note   Patient Details  Name: TAM DELISLE MRN: [id number] Date of Birth: 1934/02/22  [facility] CM/SW Contact:  Illene Regulus, LCSW Phone Number: 10/22/2021, 2:50 PM   Clinical Narrative:     Pt is d/c with Home Hospice through [facility]. CSW arranged [facility] for transport home upon d/c.   Final next level of care: Home w Home Health Services Barriers to Discharge: No Barriers Identified   Patient Goals and CMS Choice Patient states their goals for this hospitalization and ongoing recovery are:: AMS   Choice offered to / list presented to : Spouse  Discharge Placement                       Discharge Plan and Services                                     Social Determinants of Health (SDOH) Interventions     Readmission Risk Interventions No flowsheet data found.

## 2021-10-23 NOTE — Progress Notes (Signed)
Daily Progress Note   Patient Name: Ian Weber       Date: 10/23/2021 DOB: 1934/06/04  Age: 88 y.o. MRN#: [id number] Attending Physician: No att. providers found Primary Care Physician: Ian Panda, MD Admit Date: 10/04/2021  Reason for Consultation/Follow-up: Establishing goals of care  Subjective: I saw and examined Ian Weber today.  He was lying in bed in no distress.  Awake and interactive.  I mention going home and he said that he is very much looking forward to being at home with his wife.  Length of Stay: 18 Physical Exam   General: Confused but awake and interactive. Heart: Regular rate and rhythm. No murmur appreciated. Lungs: Coarse throughout Abdomen: Soft, nontender, nondistended, positive bowel sounds.   Ext: No significant edema        Vital Signs: BP 117/76 (BP Location: Left Arm)    Pulse 67    Temp 97.6 F (36.4 C) (Oral)    Resp 14    Ht 6' (1.829 m)    Wt 106.8 kg    SpO2 97%    BMI 31.93 kg/m  SpO2: SpO2: 97 % O2 Device: O2 Device: Room Air O2 Flow Rate: O2 Flow Rate (L/min): 2 L/min  Intake/output summary: No intake or output data in the 24 hours ending 10/23/21 1004  LBM: Last BM Date: 10/14/21 (per chart) Baseline Weight: Weight: 72.6 kg Most recent weight: Weight: 106.8 kg       Palliative Assessment/Data:      Patient Active Problem List   Diagnosis Date Noted   Pressure injury of skin 10/05/2021   COVID-19 virus infection 10/05/2021   Elevated troponin 10/05/2021   Dementia without behavioral disturbance ([place]) 10/05/2021   HTN (hypertension) 10/05/2021   AMS (altered mental status) 10/04/2021   Anxiety 09/24/2017   GIB (gastrointestinal bleeding) 09/24/2017   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   Constipation 01/02/2012   BRBPR (bright  red blood per rectum) 01/02/2012   VITAMIN D DEFICIENCY 10/22/2009   Diverticulosis of colon with hemorrhage 10/22/2009   ABDOMINAL PAIN, EPIGASTRIC 10/22/2009   THYROID CANCER, HX OF 10/22/2009   HYPOTHYROIDISM, POSTSURGICAL 10/20/2009   Essential hypertension 10/20/2009   ESOPHAGEAL STRICTURE 10/20/2009   GERD 10/20/2009   HIATAL HERNIA 10/20/2009    Palliative Care Assessment & Plan   Patient Profile: 88 year old male  with past medical history of dementia, hypertension, hypothyroidism, GI bleed admitted with weakness, altered mental status, and excess secretions with increased work of breathing in light of known COVID-19 infection.  Recommendations/Plan: DNR/DNI Continue comfort feeds as tolerated He appears stable to transition home with hospice today.  Care plan was discussed with wife  Thank you for allowing the Palliative Medicine Team to assist in the care of this patient.  Micheline Rough, MD  Please contact Palliative Medicine Team phone at [phone] for questions and concerns.

## 2021-11-07 ENCOUNTER — Encounter (HOSPITAL_COMMUNITY): Payer: Self-pay | Admitting: Radiology

## 2021-12-07 DEATH — deceased
